# Patient Record
Sex: Female | Born: 1985 | Race: Black or African American | Hispanic: No | Marital: Single | Smoking: Never smoker
Health system: Southern US, Community
[De-identification: ages and names within clinical notes are randomized; demographics above are authoritative.]

## PROBLEM LIST (undated history)

## (undated) DIAGNOSIS — T7840XA Allergy, unspecified, initial encounter: Secondary | ICD-10-CM

## (undated) DIAGNOSIS — R519 Headache, unspecified: Secondary | ICD-10-CM

## (undated) DIAGNOSIS — I10 Essential (primary) hypertension: Secondary | ICD-10-CM

## (undated) DIAGNOSIS — Z973 Presence of spectacles and contact lenses: Secondary | ICD-10-CM

## (undated) DIAGNOSIS — R51 Headache: Secondary | ICD-10-CM

## (undated) DIAGNOSIS — K219 Gastro-esophageal reflux disease without esophagitis: Secondary | ICD-10-CM

## (undated) DIAGNOSIS — E119 Type 2 diabetes mellitus without complications: Secondary | ICD-10-CM

## (undated) HISTORY — DX: Essential (primary) hypertension: I10

## (undated) HISTORY — PX: CHOLECYSTECTOMY: SHX55

## (undated) HISTORY — DX: Allergy, unspecified, initial encounter: T78.40XA

## (undated) HISTORY — DX: Type 2 diabetes mellitus without complications: E11.9

---
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
## 2007-09-06 ENCOUNTER — Emergency Department: Payer: Self-pay | Admitting: Emergency Medicine

## 2007-12-18 ENCOUNTER — Emergency Department: Payer: Self-pay | Admitting: Emergency Medicine

## 2008-03-17 ENCOUNTER — Emergency Department: Payer: Self-pay | Admitting: Emergency Medicine

## 2008-05-10 ENCOUNTER — Emergency Department: Payer: Self-pay | Admitting: Emergency Medicine

## 2008-09-04 ENCOUNTER — Emergency Department: Payer: Self-pay | Admitting: Emergency Medicine

## 2008-11-04 ENCOUNTER — Emergency Department: Payer: Self-pay | Admitting: Emergency Medicine

## 2009-02-12 ENCOUNTER — Observation Stay: Payer: Self-pay | Admitting: Unknown Physician Specialty

## 2009-02-14 ENCOUNTER — Encounter: Payer: Self-pay | Admitting: Obstetrics & Gynecology

## 2009-03-08 ENCOUNTER — Emergency Department: Payer: Self-pay | Admitting: Emergency Medicine

## 2009-04-28 ENCOUNTER — Inpatient Hospital Stay: Payer: Self-pay | Admitting: Obstetrics & Gynecology

## 2009-05-06 ENCOUNTER — Emergency Department: Payer: Self-pay | Admitting: Emergency Medicine

## 2009-05-30 ENCOUNTER — Emergency Department: Payer: Self-pay | Admitting: Emergency Medicine

## 2010-03-17 ENCOUNTER — Emergency Department: Payer: Self-pay | Admitting: Emergency Medicine

## 2010-05-02 ENCOUNTER — Emergency Department: Payer: Self-pay | Admitting: Emergency Medicine

## 2010-06-22 ENCOUNTER — Emergency Department: Payer: Self-pay | Admitting: Internal Medicine

## 2010-12-18 ENCOUNTER — Emergency Department: Payer: Self-pay | Admitting: Emergency Medicine

## 2011-01-02 ENCOUNTER — Inpatient Hospital Stay: Payer: Self-pay | Admitting: Internal Medicine

## 2011-01-19 ENCOUNTER — Ambulatory Visit: Payer: Self-pay | Admitting: "Endocrinology

## 2011-02-14 ENCOUNTER — Ambulatory Visit: Payer: Self-pay | Admitting: "Endocrinology

## 2011-04-15 ENCOUNTER — Ambulatory Visit: Payer: Self-pay

## 2011-10-28 ENCOUNTER — Emergency Department: Payer: Self-pay | Admitting: Emergency Medicine

## 2011-10-28 LAB — URINALYSIS, COMPLETE
Bacteria: NONE SEEN
Glucose,UR: NEGATIVE mg/dL (ref 0–75)
Ph: 5 (ref 4.5–8.0)
Protein: NEGATIVE
RBC,UR: 8 /HPF (ref 0–5)
Squamous Epithelial: 6

## 2011-10-28 LAB — COMPREHENSIVE METABOLIC PANEL
Albumin: 3.6 g/dL (ref 3.4–5.0)
Alkaline Phosphatase: 63 U/L (ref 50–136)
Anion Gap: 7 (ref 7–16)
Calcium, Total: 8.8 mg/dL (ref 8.5–10.1)
Chloride: 105 mmol/L (ref 98–107)
Co2: 27 mmol/L (ref 21–32)
Creatinine: 0.64 mg/dL (ref 0.60–1.30)
EGFR (Non-African Amer.): >60
Glucose: 122 mg/dL — ABNORMAL HIGH (ref 65–99)
Osmolality: 278 (ref 275–301)
Potassium: 3.6 mmol/L (ref 3.5–5.1)
SGOT(AST): 13 U/L — ABNORMAL LOW (ref 15–37)
SGPT (ALT): 23 U/L
Sodium: 139 mmol/L (ref 136–145)

## 2011-10-28 LAB — CBC
HCT: 36.9 % (ref 35.0–47.0)
HGB: 12 g/dL (ref 12.0–16.0)
MCHC: 32.5 g/dL (ref 32.0–36.0)
MCV: 77 fL — ABNORMAL LOW (ref 80–100)
Platelet: 301 10*3/uL (ref 150–440)
RBC: 4.81 10*6/uL (ref 3.80–5.20)
WBC: 8.9 10*3/uL (ref 3.6–11.0)

## 2011-11-22 ENCOUNTER — Emergency Department: Payer: Self-pay | Admitting: *Deleted

## 2011-12-26 ENCOUNTER — Emergency Department: Payer: Self-pay | Admitting: Emergency Medicine

## 2011-12-26 LAB — COMPREHENSIVE METABOLIC PANEL
Albumin: 3.7 g/dL (ref 3.4–5.0)
Alkaline Phosphatase: 98 U/L (ref 50–136)
Anion Gap: 7 (ref 7–16)
BUN: 9 mg/dL (ref 7–18)
Chloride: 99 mmol/L (ref 98–107)
Creatinine: 0.89 mg/dL (ref 0.60–1.30)
EGFR (Non-African Amer.): >60
Glucose: 482 mg/dL — ABNORMAL HIGH (ref 65–99)
Potassium: 4 mmol/L (ref 3.5–5.1)
SGOT(AST): 13 U/L — ABNORMAL LOW (ref 15–37)
SGPT (ALT): 23 U/L
Total Protein: 8.5 g/dL — ABNORMAL HIGH (ref 6.4–8.2)

## 2011-12-26 LAB — URINALYSIS, COMPLETE
Bacteria: NONE SEEN
Bilirubin,UR: NEGATIVE
Blood: NEGATIVE
Glucose,UR: >=500 mg/dL (ref 0–75)
Ketone: NEGATIVE
Leukocyte Esterase: NEGATIVE
Specific Gravity: 1.016 (ref 1.003–1.030)
Squamous Epithelial: <1
WBC UR: 1 /HPF (ref 0–5)

## 2011-12-26 LAB — CBC
MCHC: 31.9 g/dL — ABNORMAL LOW (ref 32.0–36.0)
Platelet: 290 10*3/uL (ref 150–440)
RBC: 5.13 10*6/uL (ref 3.80–5.20)
RDW: 15.4 % — ABNORMAL HIGH (ref 11.5–14.5)

## 2011-12-26 LAB — PREGNANCY, URINE: Pregnancy Test, Urine: NEGATIVE m[IU]/mL

## 2011-12-31 ENCOUNTER — Emergency Department: Payer: Self-pay | Admitting: Emergency Medicine

## 2011-12-31 LAB — URINALYSIS, COMPLETE
Glucose,UR: NEGATIVE mg/dL (ref 0–75)
Ketone: NEGATIVE
Ph: 5 (ref 4.5–8.0)
Specific Gravity: 1.02 (ref 1.003–1.030)
Squamous Epithelial: 5
WBC UR: 4 /HPF (ref 0–5)

## 2011-12-31 LAB — COMPREHENSIVE METABOLIC PANEL
Albumin: 4 g/dL (ref 3.4–5.0)
Alkaline Phosphatase: 81 U/L (ref 50–136)
BUN: 11 mg/dL (ref 7–18)
Bilirubin,Total: 0.5 mg/dL (ref 0.2–1.0)
Chloride: 97 mmol/L — ABNORMAL LOW (ref 98–107)
Creatinine: 1.14 mg/dL (ref 0.60–1.30)
EGFR (Non-African Amer.): >60
SGPT (ALT): 21 U/L
Sodium: 135 mmol/L — ABNORMAL LOW (ref 136–145)
Total Protein: 8.7 g/dL — ABNORMAL HIGH (ref 6.4–8.2)

## 2011-12-31 LAB — PREGNANCY, URINE: Pregnancy Test, Urine: NEGATIVE m[IU]/mL

## 2011-12-31 LAB — CBC
HGB: 13.3 g/dL (ref 12.0–16.0)
MCH: 24.3 pg — ABNORMAL LOW (ref 26.0–34.0)
MCV: 78 fL — ABNORMAL LOW (ref 80–100)
Platelet: 366 10*3/uL (ref 150–440)
RBC: 5.47 10*6/uL — ABNORMAL HIGH (ref 3.80–5.20)
RDW: 14.7 % — ABNORMAL HIGH (ref 11.5–14.5)
WBC: 13.8 10*3/uL — ABNORMAL HIGH (ref 3.6–11.0)

## 2012-02-15 ENCOUNTER — Emergency Department: Payer: Self-pay | Admitting: Internal Medicine

## 2012-02-15 LAB — COMPREHENSIVE METABOLIC PANEL
Alkaline Phosphatase: 75 U/L (ref 50–136)
BUN: 10 mg/dL (ref 7–18)
Bilirubin,Total: 0.2 mg/dL (ref 0.2–1.0)
Chloride: 105 mmol/L (ref 98–107)
Creatinine: 0.73 mg/dL (ref 0.60–1.30)
EGFR (African American): >60
EGFR (Non-African Amer.): >60
Glucose: 159 mg/dL — ABNORMAL HIGH (ref 65–99)
Osmolality: 280 (ref 275–301)
Potassium: 3.8 mmol/L (ref 3.5–5.1)
SGOT(AST): 11 U/L — ABNORMAL LOW (ref 15–37)
SGPT (ALT): 23 U/L (ref 12–78)

## 2012-02-15 LAB — URINALYSIS, COMPLETE
Bacteria: NONE SEEN
Glucose,UR: NEGATIVE mg/dL (ref 0–75)
Ketone: NEGATIVE
Nitrite: NEGATIVE
Protein: NEGATIVE
RBC,UR: 1 /HPF (ref 0–5)
Specific Gravity: 1.02 (ref 1.003–1.030)
Squamous Epithelial: 4
WBC UR: 2 /HPF (ref 0–5)

## 2012-02-15 LAB — CBC
HGB: 13.2 g/dL (ref 12.0–16.0)
MCHC: 33.4 g/dL (ref 32.0–36.0)
Platelet: 329 10*3/uL (ref 150–440)
RDW: 15.1 % — ABNORMAL HIGH (ref 11.5–14.5)
WBC: 5.2 10*3/uL (ref 3.6–11.0)

## 2012-02-15 LAB — LIPASE, BLOOD: Lipase: 60 U/L — ABNORMAL LOW (ref 73–393)

## 2012-04-14 ENCOUNTER — Emergency Department: Payer: Self-pay | Admitting: Emergency Medicine

## 2012-04-14 LAB — COMPREHENSIVE METABOLIC PANEL
Calcium, Total: 9.3 mg/dL (ref 8.5–10.1)
Chloride: 92 mmol/L — ABNORMAL LOW (ref 98–107)
Creatinine: 0.91 mg/dL (ref 0.60–1.30)
EGFR (African American): >60
EGFR (Non-African Amer.): >60
Glucose: 455 mg/dL — ABNORMAL HIGH (ref 65–99)
SGOT(AST): 14 U/L — ABNORMAL LOW (ref 15–37)
SGPT (ALT): 21 U/L (ref 12–78)
Total Protein: 8.3 g/dL — ABNORMAL HIGH (ref 6.4–8.2)

## 2012-04-14 LAB — CBC WITH DIFFERENTIAL/PLATELET
Eosinophil #: 0.1 10*3/uL (ref 0.0–0.7)
HCT: 41.2 % (ref 35.0–47.0)
Lymphocyte %: 19.5 %
MCHC: 33.4 g/dL (ref 32.0–36.0)
Monocyte %: 7.9 %
Neutrophil %: 71.4 %
Platelet: 312 10*3/uL (ref 150–440)
RBC: 5.26 10*6/uL — ABNORMAL HIGH (ref 3.80–5.20)
RDW: 13.8 % (ref 11.5–14.5)
WBC: 12.4 10*3/uL — ABNORMAL HIGH (ref 3.6–11.0)

## 2012-04-14 LAB — URINALYSIS, COMPLETE
Bilirubin,UR: NEGATIVE
Glucose,UR: >=500 mg/dL (ref 0–75)
RBC,UR: 16 /HPF (ref 0–5)
Squamous Epithelial: 1
WBC UR: 46 /HPF (ref 0–5)

## 2012-04-14 LAB — DRUG SCREEN, URINE
Amphetamines, Ur Screen: NEGATIVE (ref ?–1000)
Benzodiazepine, Ur Scrn: NEGATIVE (ref ?–200)
Cannabinoid 50 Ng, Ur ~~LOC~~: NEGATIVE (ref ?–50)
MDMA (Ecstasy)Ur Screen: NEGATIVE (ref ?–500)
Phencyclidine (PCP) Ur S: NEGATIVE (ref ?–25)

## 2012-04-14 LAB — PREGNANCY, URINE: Pregnancy Test, Urine: NEGATIVE m[IU]/mL

## 2012-04-17 ENCOUNTER — Emergency Department: Payer: Self-pay | Admitting: Emergency Medicine

## 2012-04-17 LAB — COMPREHENSIVE METABOLIC PANEL
Albumin: 3.9 g/dL (ref 3.4–5.0)
Alkaline Phosphatase: 109 U/L (ref 50–136)
BUN: 12 mg/dL (ref 7–18)
Calcium, Total: 9.7 mg/dL (ref 8.5–10.1)
Chloride: 87 mmol/L — ABNORMAL LOW (ref 98–107)
Co2: 25 mmol/L (ref 21–32)
Creatinine: 0.86 mg/dL (ref 0.60–1.30)
Potassium: 4.9 mmol/L (ref 3.5–5.1)
SGOT(AST): 20 U/L (ref 15–37)
SGPT (ALT): 23 U/L (ref 12–78)

## 2012-04-17 LAB — URINALYSIS, COMPLETE
Bacteria: NONE SEEN
Glucose,UR: >=500 mg/dL (ref 0–75)
Nitrite: NEGATIVE
Ph: 6 (ref 4.5–8.0)
Protein: NEGATIVE
RBC,UR: 994 /HPF (ref 0–5)
Specific Gravity: 1.024 (ref 1.003–1.030)
WBC UR: 25 /HPF (ref 0–5)

## 2012-04-17 LAB — CBC
HCT: 42.2 % (ref 35.0–47.0)
MCHC: 33.8 g/dL (ref 32.0–36.0)
MCV: 79 fL — ABNORMAL LOW (ref 80–100)
Platelet: 336 10*3/uL (ref 150–440)
RDW: 14 % (ref 11.5–14.5)
WBC: 14.2 10*3/uL — ABNORMAL HIGH (ref 3.6–11.0)

## 2012-04-20 LAB — URINE CULTURE

## 2012-04-26 ENCOUNTER — Observation Stay: Payer: Self-pay | Admitting: Specialist

## 2012-04-26 LAB — URINALYSIS, COMPLETE
Blood: NEGATIVE
Leukocyte Esterase: NEGATIVE
Nitrite: NEGATIVE
Ph: 6 (ref 4.5–8.0)
Protein: NEGATIVE
RBC,UR: <1 /HPF (ref 0–5)
Squamous Epithelial: 1

## 2012-04-26 LAB — CBC WITH DIFFERENTIAL/PLATELET
Basophil #: 0.1 10*3/uL (ref 0.0–0.1)
Basophil %: 0.6 %
Eosinophil #: 0.1 10*3/uL (ref 0.0–0.7)
HCT: 44.1 % (ref 35.0–47.0)
HGB: 14 g/dL (ref 12.0–16.0)
Lymphocyte %: 25.2 %
MCHC: 31.7 g/dL — ABNORMAL LOW (ref 32.0–36.0)
Monocyte #: 1 x10 3/mm — ABNORMAL HIGH (ref 0.2–0.9)
Monocyte %: 7.6 %
Neutrophil #: 8.3 10*3/uL — ABNORMAL HIGH (ref 1.4–6.5)
Neutrophil %: 66 %
Platelet: 374 10*3/uL (ref 150–440)
RDW: 13.7 % (ref 11.5–14.5)
WBC: 12.6 10*3/uL — ABNORMAL HIGH (ref 3.6–11.0)

## 2012-04-26 LAB — COMPREHENSIVE METABOLIC PANEL
Albumin: 4 g/dL (ref 3.4–5.0)
BUN: 14 mg/dL (ref 7–18)
Bilirubin,Total: 0.4 mg/dL (ref 0.2–1.0)
Chloride: 87 mmol/L — ABNORMAL LOW (ref 98–107)
Co2: 24 mmol/L (ref 21–32)
Creatinine: 1.13 mg/dL (ref 0.60–1.30)
EGFR (African American): >60
EGFR (Non-African Amer.): >60
Osmolality: 288 (ref 275–301)
Potassium: 4.5 mmol/L (ref 3.5–5.1)
SGPT (ALT): 28 U/L (ref 12–78)
Sodium: 125 mmol/L — ABNORMAL LOW (ref 136–145)
Total Protein: 9 g/dL — ABNORMAL HIGH (ref 6.4–8.2)

## 2012-04-26 LAB — PREGNANCY, URINE: Pregnancy Test, Urine: NEGATIVE m[IU]/mL

## 2012-04-26 LAB — MAGNESIUM: Magnesium: 2.4 mg/dL

## 2012-04-26 LAB — PHOSPHORUS: Phosphorus: 4.1 mg/dL (ref 2.5–4.9)

## 2012-04-27 LAB — CBC WITH DIFFERENTIAL/PLATELET
Basophil #: 0.1 10*3/uL (ref 0.0–0.1)
Basophil %: 0.6 %
Eosinophil #: 0.2 10*3/uL (ref 0.0–0.7)
Eosinophil %: 1.9 %
HCT: 36.6 % (ref 35.0–47.0)
HGB: 12.2 g/dL (ref 12.0–16.0)
Lymphocyte %: 34.7 %
Monocyte #: 0.8 x10 3/mm (ref 0.2–0.9)
Monocyte %: 8.2 %
Neutrophil #: 5.6 10*3/uL (ref 1.4–6.5)
Neutrophil %: 54.6 %
RBC: 4.66 10*6/uL (ref 3.80–5.20)
WBC: 10.2 10*3/uL (ref 3.6–11.0)

## 2012-04-27 LAB — BASIC METABOLIC PANEL
Anion Gap: 11 (ref 7–16)
BUN: 7 mg/dL (ref 7–18)
Calcium, Total: 8.2 mg/dL — ABNORMAL LOW (ref 8.5–10.1)
Chloride: 94 mmol/L — ABNORMAL LOW (ref 98–107)
Glucose: 230 mg/dL — ABNORMAL HIGH (ref 65–99)
Osmolality: 264 (ref 275–301)

## 2012-06-23 ENCOUNTER — Emergency Department: Payer: Self-pay | Admitting: Emergency Medicine

## 2012-06-23 LAB — COMPREHENSIVE METABOLIC PANEL
Albumin: 3.8 g/dL (ref 3.4–5.0)
Alkaline Phosphatase: 73 U/L (ref 50–136)
Bilirubin,Total: 0.3 mg/dL (ref 0.2–1.0)
Calcium, Total: 9.5 mg/dL (ref 8.5–10.1)
Creatinine: 0.73 mg/dL (ref 0.60–1.30)
EGFR (Non-African Amer.): >60
Glucose: 143 mg/dL — ABNORMAL HIGH (ref 65–99)
SGPT (ALT): 23 U/L (ref 12–78)
Sodium: 134 mmol/L — ABNORMAL LOW (ref 136–145)

## 2012-06-23 LAB — URINALYSIS, COMPLETE
Bilirubin,UR: NEGATIVE
Blood: NEGATIVE
Glucose,UR: NEGATIVE mg/dL (ref 0–75)
Ketone: NEGATIVE
Leukocyte Esterase: NEGATIVE
Nitrite: NEGATIVE
Ph: 5 (ref 4.5–8.0)
Protein: NEGATIVE
RBC,UR: <1 /HPF (ref 0–5)
Squamous Epithelial: 1
WBC UR: 1 /HPF (ref 0–5)

## 2012-06-23 LAB — CBC
HCT: 40.1 % (ref 35.0–47.0)
HGB: 13.2 g/dL (ref 12.0–16.0)
MCH: 25.5 pg — ABNORMAL LOW (ref 26.0–34.0)
MCV: 77 fL — ABNORMAL LOW (ref 80–100)
Platelet: 354 10*3/uL (ref 150–440)
RBC: 5.19 10*6/uL (ref 3.80–5.20)
WBC: 11.9 10*3/uL — ABNORMAL HIGH (ref 3.6–11.0)

## 2012-06-23 LAB — LIPASE, BLOOD: Lipase: 82 U/L (ref 73–393)

## 2012-06-23 LAB — PREGNANCY, URINE: Pregnancy Test, Urine: NEGATIVE m[IU]/mL

## 2012-09-22 ENCOUNTER — Encounter: Payer: Self-pay | Admitting: Family Medicine

## 2012-09-22 ENCOUNTER — Ambulatory Visit (INDEPENDENT_AMBULATORY_CARE_PROVIDER_SITE_OTHER): Payer: 59 | Admitting: Family Medicine

## 2012-09-22 VITALS — BP 130/80 | HR 71 | Temp 98.1°F | Ht 66.0 in | Wt 276.8 lb

## 2012-09-22 DIAGNOSIS — E119 Type 2 diabetes mellitus without complications: Secondary | ICD-10-CM | POA: Insufficient documentation

## 2012-09-22 DIAGNOSIS — Z23 Encounter for immunization: Secondary | ICD-10-CM

## 2012-09-22 DIAGNOSIS — I10 Essential (primary) hypertension: Secondary | ICD-10-CM

## 2012-09-22 DIAGNOSIS — Z8679 Personal history of other diseases of the circulatory system: Secondary | ICD-10-CM | POA: Insufficient documentation

## 2012-09-22 DIAGNOSIS — IMO0001 Reserved for inherently not codable concepts without codable children: Secondary | ICD-10-CM

## 2012-09-22 LAB — COMPREHENSIVE METABOLIC PANEL
ALT: 18 U/L (ref 0–35)
CO2: 26 mEq/L (ref 19–32)
GFR: 131.72 mL/min (ref >60.00)
Sodium: 138 mEq/L (ref 135–145)
Total Bilirubin: 0.5 mg/dL (ref 0.3–1.2)
Total Protein: 7.3 g/dL (ref 6.0–8.3)

## 2012-09-22 LAB — HM DIABETES FOOT EXAM

## 2012-09-22 LAB — LIPID PANEL
HDL: 29.9 mg/dL — ABNORMAL LOW (ref >39.00)
Total CHOL/HDL Ratio: 5

## 2012-09-22 LAB — HEMOGLOBIN A1C: Hgb A1c MFr Bld: 8.2 % — ABNORMAL HIGH (ref 4.6–6.5)

## 2012-09-22 MED ORDER — METFORMIN HCL 500 MG PO TABS: 500.0000 mg | ORAL_TABLET | Freq: Two times a day (BID) | ORAL | Status: DC

## 2012-09-22 MED ORDER — HYDROCHLOROTHIAZIDE 25 MG PO TABS: 25.0000 mg | ORAL_TABLET | Freq: Every day | ORAL | Status: DC

## 2012-09-22 MED ORDER — LISINOPRIL 10 MG PO TABS: 10.0000 mg | ORAL_TABLET | Freq: Every day | ORAL | Status: DC

## 2012-09-22 MED ORDER — GLIPIZIDE 10 MG PO TABS: 10.0000 mg | ORAL_TABLET | Freq: Two times a day (BID) | ORAL | Status: DC

## 2012-09-22 NOTE — Assessment & Plan Note (Signed)
At goal < 130/80 on lisinopril, HCTZ.

## 2012-09-22 NOTE — Assessment & Plan Note (Signed)
Likely poor control. Refer to nutritionist. Discussed stopping soda and juice to start.  check A1C.. Likely need to adjust insulin and met, gliburide. Higher dose metformin in past has cause CBGs to drop too low. Instructed her to check FBS daily and bring in measurements.

## 2012-09-22 NOTE — Addendum Note (Signed)
Addended by: Consuello Masse on: 09/22/2012 09:47 AM   Modules accepted: Orders

## 2012-09-22 NOTE — Progress Notes (Signed)
  Subjective:    Patient ID: [name], female    DOB: [birth year], 27 y.o.   MRN: [id number]  HPI 27 year old female presents to establish.  In past she had been seeing Dr. Tedd Sias for ENDO. Last saw her in 06/2012 for diabetes.  Diabetes:  Inadequately control. Not sure what last A1C was. She was diagnosed with DM in 2012. Taking metformin glipizide and a long acting insulin at bedtime 30 Units Using medications without difficulties: None Hypoglycemic episodes:None in last 2 weeks Hyperglycemic episodes: 338 Feet problems: None Blood Sugars averaging: not checking fasting.. did take a postprandial 93-180 last week eye exam within last year: yes   Hypertension:    Using medication without problems or lightheadedness: None Chest pain with exertion:None Edema:None Short of breath:None Average home BPs: not checking Other issues:  Diet: moderate... Eats a lot of fried foods, drinks juice/soda,  Bread, pasta, potatos.   Last pap 06/2012 at [facility] side OB.  Has nexplanan... Not going there for CPX regularly.. Will come her for CPX next year.  Review of Systems  Constitutional: Negative for fever, fatigue and unexpected weight change.  HENT: Negative for ear pain, congestion, sore throat, sneezing, trouble swallowing and sinus pressure.   Eyes: Negative for pain and itching.  Respiratory: Negative for cough, shortness of breath and wheezing.   Cardiovascular: Negative for chest pain, palpitations and leg swelling.  Gastrointestinal: Negative for nausea, abdominal pain, diarrhea, constipation and blood in stool.  Genitourinary: Negative for dysuria, hematuria, vaginal discharge, difficulty urinating and menstrual problem.  Skin: Negative for rash.  Neurological: Negative for syncope, weakness, light-headedness, numbness and headaches.  Psychiatric/Behavioral: Negative for confusion and dysphoric mood. The patient is not nervous/anxious.        Objective:   Physical Exam   Constitutional: Vital signs are normal. She appears well-developed and well-nourished. She is cooperative.  Non-toxic appearance. She does not appear ill. No distress.  obese  HENT:  Head: Normocephalic.  Right Ear: Hearing, tympanic membrane, external ear and ear canal normal.  Left Ear: Hearing, tympanic membrane, external ear and ear canal normal.  Nose: Nose normal.  Eyes: Conjunctivae, EOM and lids are normal. Pupils are equal, round, and reactive to light. No foreign bodies found.  Neck: Trachea normal and normal range of motion. Neck supple. Carotid bruit is not present. No mass and no thyromegaly present.  Cardiovascular: Normal rate, regular rhythm, S1 normal, S2 normal, normal heart sounds and intact distal pulses.  Exam reveals no gallop.   No murmur heard. Pulmonary/Chest: Effort normal and breath sounds normal. No respiratory distress. She has no wheezes. She has no rhonchi. She has no rales.  Abdominal: Soft. Normal appearance and bowel sounds are normal. She exhibits no distension, no fluid wave, no abdominal bruit and no mass. There is no hepatosplenomegaly. There is no tenderness. There is no rebound, no guarding and no CVA tenderness. No hernia.  Lymphadenopathy:    She has no cervical adenopathy.    She has no axillary adenopathy.  Neurological: She is alert. She has normal strength. No cranial nerve deficit or sensory deficit.  Skin: Skin is warm, dry and intact. No rash noted.  Psychiatric: Her speech is normal and behavior is normal. Judgment normal. Her mood appears not anxious. Cognition and memory are normal. She does not exhibit a depressed mood.          Assessment & Plan:

## 2012-09-22 NOTE — Patient Instructions (Addendum)
Call with long acting insulin name for refills.  Stop at lab on way out. At least every 2 days checking fasting blood sugar.. Bring the measurements to the next OV. Continue current meds for now. Follow up in 3 months with labs prior.

## 2012-09-29 ENCOUNTER — Telehealth: Payer: Self-pay | Admitting: *Deleted

## 2012-09-29 MED ORDER — INSULIN GLARGINE 100 UNIT/ML ~~LOC~~ SOLN: 30.0000 [IU] | Freq: Every day | SUBCUTANEOUS | Status: DC

## 2012-09-29 NOTE — Telephone Encounter (Signed)
Medication refill

## 2012-10-04 ENCOUNTER — Telehealth: Payer: Self-pay

## 2012-10-04 NOTE — Telephone Encounter (Signed)
Pt said on 10/03/12 at 9:15 pm BS was 510; pt ate [place] sandwich for supper; took Lantus 30 units at hs, glipizide and metformin and then next BS was 512 at 9:58 pm; then next ck BS was 480 at 10:42 PM and then at 11:30 PM BS 402. Toes were numb and pt was very thirsty.10/04/12 FBS was 248; ate breakfast (blueberry waffle and water); at 9:15 am BS 302 and then recked BS at 12 noon BS 270. Pt has not eaten lunch; pt said today toes are still numb and pt has h/a. [facility] emp pharmacy.Please advise.

## 2012-10-04 NOTE — Telephone Encounter (Signed)
Fasting blood sugars very high as well as post prandials. i know she has dropped to low in past but her numbers are way to high... We need to get them down and she should tolerate medicaiton adjustment.  I would have her start by increasing Lantus at bedtime to 40 Units.. Continue other medications the same.  Have her call in 1 week with fasting blood sugars and 2 hour post prandials. We will adjust lantus until FBS are < 120.

## 2012-10-04 NOTE — Telephone Encounter (Signed)
Pt left v/m that her BS running in 500's and request call back to adjust Lantus. Left v/m for pt to call back.

## 2012-10-04 NOTE — Telephone Encounter (Signed)
Patient advised.

## 2012-10-04 NOTE — Telephone Encounter (Signed)
Pt left v/m at work and to call back after 1 pm if no answer when I return call;left v/m for pt to call back.

## 2012-10-09 ENCOUNTER — Emergency Department: Payer: Self-pay | Admitting: Emergency Medicine

## 2012-10-10 ENCOUNTER — Telehealth: Payer: Self-pay | Admitting: Family Medicine

## 2012-10-10 DIAGNOSIS — R200 Anesthesia of skin: Secondary | ICD-10-CM

## 2012-10-10 NOTE — Telephone Encounter (Signed)
The patient called and is hoping to get a referral to a [place] neurologist.  She states she was seen in the emergency room for leg numbness and they made the recommendation to go through her primary care office to get this referral.  Her callback number is [phone]

## 2012-10-11 DIAGNOSIS — R200 Anesthesia of skin: Secondary | ICD-10-CM | POA: Insufficient documentation

## 2012-10-18 ENCOUNTER — Ambulatory Visit: Payer: 59 | Admitting: Family Medicine

## 2012-11-03 ENCOUNTER — Encounter: Payer: 59 | Attending: Family Medicine | Admitting: Dietician

## 2012-12-15 ENCOUNTER — Other Ambulatory Visit: Payer: 59

## 2012-12-22 ENCOUNTER — Ambulatory Visit: Payer: 59 | Admitting: Family Medicine

## 2012-12-23 ENCOUNTER — Ambulatory Visit: Payer: 59 | Admitting: Family Medicine

## 2012-12-25 ENCOUNTER — Emergency Department: Payer: Self-pay | Admitting: Emergency Medicine

## 2012-12-25 LAB — COMPREHENSIVE METABOLIC PANEL
Albumin: 4 g/dL (ref 3.4–5.0)
Alkaline Phosphatase: 70 U/L (ref 50–136)
BUN: 10 mg/dL (ref 7–18)
Bilirubin,Total: 0.5 mg/dL (ref 0.2–1.0)
Calcium, Total: 9.6 mg/dL (ref 8.5–10.1)
Co2: 26 mmol/L (ref 21–32)
Potassium: 3.7 mmol/L (ref 3.5–5.1)
SGOT(AST): 13 U/L — ABNORMAL LOW (ref 15–37)
SGPT (ALT): 19 U/L (ref 12–78)
Sodium: 135 mmol/L — ABNORMAL LOW (ref 136–145)
Total Protein: 8.4 g/dL — ABNORMAL HIGH (ref 6.4–8.2)

## 2012-12-25 LAB — URINALYSIS, COMPLETE
Bilirubin,UR: NEGATIVE
Blood: NEGATIVE
Glucose,UR: NEGATIVE mg/dL (ref 0–75)
Ketone: NEGATIVE
Ph: 5 (ref 4.5–8.0)
Protein: NEGATIVE

## 2012-12-25 LAB — CBC
HCT: 41.5 % (ref 35.0–47.0)
MCH: 25.6 pg — ABNORMAL LOW (ref 26.0–34.0)
MCV: 77 fL — ABNORMAL LOW (ref 80–100)
Platelet: 378 10*3/uL (ref 150–440)
RBC: 5.41 10*6/uL — ABNORMAL HIGH (ref 3.80–5.20)
RDW: 15.1 % — ABNORMAL HIGH (ref 11.5–14.5)
WBC: 14.8 10*3/uL — ABNORMAL HIGH (ref 3.6–11.0)

## 2012-12-29 ENCOUNTER — Ambulatory Visit: Payer: 59 | Admitting: Family Medicine

## 2012-12-29 DIAGNOSIS — Z0289 Encounter for other administrative examinations: Secondary | ICD-10-CM

## 2013-01-03 ENCOUNTER — Ambulatory Visit: Payer: 59 | Admitting: Family Medicine

## 2013-01-04 ENCOUNTER — Other Ambulatory Visit: Payer: Self-pay

## 2013-01-04 MED ORDER — "PEN NEEDLES 3/16"" 31G X 5 MM MISC": 1.0000 "pen " | Status: DC

## 2013-01-04 NOTE — Telephone Encounter (Signed)
Pt said no refills on glipizide,lantus solostar and metformin and pen needles.spoke with Harrold Donath at [facility] and does have refills except pen needles; pt been using 31 G 3/16" pen needles; called in to use with lantus solostar. Pt notified.

## 2013-01-10 ENCOUNTER — Encounter: Payer: Self-pay | Admitting: Family Medicine

## 2013-01-10 ENCOUNTER — Ambulatory Visit (INDEPENDENT_AMBULATORY_CARE_PROVIDER_SITE_OTHER): Payer: 59 | Admitting: Family Medicine

## 2013-01-10 VITALS — BP 124/72 | HR 76 | Temp 98.0°F | Wt 267.0 lb

## 2013-01-10 DIAGNOSIS — I10 Essential (primary) hypertension: Secondary | ICD-10-CM

## 2013-01-10 DIAGNOSIS — IMO0001 Reserved for inherently not codable concepts without codable children: Secondary | ICD-10-CM

## 2013-01-10 LAB — HEMOGLOBIN A1C: Hgb A1c MFr Bld: 8.3 % — ABNORMAL HIGH (ref 4.6–6.5)

## 2013-01-10 NOTE — Assessment & Plan Note (Signed)
Well controlled on current med.

## 2013-01-10 NOTE — Assessment & Plan Note (Signed)
Improved control. Send for A1C.  She will loo into victoza or byetta... If we move ahead with this we may need to stop one of her other meds like glipizide.

## 2013-01-10 NOTE — Patient Instructions (Addendum)
Look into insurance coverage of Byetta or Victoza. Stop at lab on the way out.. We will call you with measurement. Follow up in 3 months with labs prior.

## 2013-01-10 NOTE — Progress Notes (Signed)
27 year old female presents to establish.   In past she had been seeing Dr. Tedd Sias for ENDO. Last saw her in 06/2012 for diabetes.   Diabetes: Due for re-eval.  Lab Results  Component Value Date   HGBA1C 8.2* 09/22/2012  She was diagnosed with DM in 2012.  Taking metformin glipizide and a long acting insulin at bedtime 40 Units  Using medications without difficulties: None  Hypoglycemic episodes: None  Hyperglycemic episodes:235.Marland Kitchen After she skipped lantus Feet problems: None  Blood Sugars averaging: FBS 109-135 eye exam within last year: yes   Shew is interested in trying a medication for weight loss and DM... Like victoza.  Hypertension:  Using medication without problems or lightheadedness: None  Chest pain with exertion:None  Edema:None  Short of breath:None  Average home BPs: not checking  Other issues:  Diet: moderate... Eats a lot of fried foods, drinks juice/soda, Bread, pasta, potatos.    Review of Systems  Constitutional: Negative for fever, fatigue and unexpected weight change.  HENT: Negative for ear pain, congestion, sore throat, sneezing, trouble swallowing and sinus pressure.  Eyes: Negative for pain and itching.  Respiratory: Negative for cough, shortness of breath and wheezing.  Cardiovascular: Negative for chest pain, palpitations and leg swelling.  Gastrointestinal: Negative for nausea, abdominal pain, diarrhea, constipation and blood in stool.  Genitourinary: Negative for dysuria, hematuria, vaginal discharge, difficulty urinating and menstrual problem.  Skin: Negative for rash.  Neurological: Negative for syncope, weakness, light-headedness, numbness and headaches.  Psychiatric/Behavioral: Negative for confusion and dysphoric mood. The patient is not nervous/anxious.  Objective:   Physical Exam  Constitutional: Vital signs are normal. She appears well-developed and well-nourished. She is cooperative. Non-toxic appearance. She does not appear ill. No distress.   obese  HENT:  Head: Normocephalic.  Right Ear: Hearing, tympanic membrane, external ear and ear canal normal.  Left Ear: Hearing, tympanic membrane, external ear and ear canal normal.  Nose: Nose normal.  Eyes: Conjunctivae, EOM and lids are normal. Pupils are equal, round, and reactive to light. No foreign bodies found.  Neck: Trachea normal and normal range of motion. Neck supple. Carotid bruit is not present. No mass and no thyromegaly present.  Cardiovascular: Normal rate, regular rhythm, S1 normal, S2 normal, normal heart sounds and intact distal pulses. Exam reveals no gallop.  No murmur heard.  Pulmonary/Chest: Effort normal and breath sounds normal. No respiratory distress. She has no wheezes. She has no rhonchi. She has no rales.  Abdominal: Soft. Normal appearance and bowel sounds are normal. She exhibits no distension, no fluid wave, no abdominal bruit and no mass. There is no hepatosplenomegaly. There is no tenderness. There is no rebound, no guarding and no CVA tenderness. No hernia.  Lymphadenopathy:  She has no cervical adenopathy.  She has no axillary adenopathy.  Neurological: She is alert. She has normal strength. No cranial nerve deficit or sensory deficit.  Skin: Skin is warm, dry and intact. No rash noted.  Psychiatric: Her speech is normal and behavior is normal. Judgment normal. Her mood appears not anxious. Cognition and memory are normal. She does not exhibit a depressed mood.    Diabetic foot exam: Normal inspection No skin breakdown No calluses  Normal DP pulses Normal sensation to light touch and monofilament Nails normal  Wt Readings from Last 3 Encounters:  01/10/13 267 lb (121.11 kg)  09/22/12 276 lb 12 oz (125.533 kg)

## 2013-01-17 ENCOUNTER — Telehealth: Payer: Self-pay

## 2013-01-17 NOTE — Telephone Encounter (Signed)
Pt said when she takes Lantus 40 units at bedtime; pt has weakness, nausea and shakiness  The next morning FBS averaging 66-85. Sunday and Monday nights pt did not take Lantus but took all other prescribed meds and did not have shakiness, nausea or weakness the next morning. Pt wants to know if should decrease Lantus. Pt not seeing endo, Dr Tedd Sias any more. [facility] pharmacy.Please advise. Pt request cb. Last dose of Lantus taken was Sat. Night 01/14/13. Today FBS was 108.

## 2013-01-17 NOTE — Telephone Encounter (Signed)
Decrease lantus to 38 units.  (change in med list please)She needs to eat a healthy high fiber, high protein snack at bedtime to help keep sugar steady through the night. Has she decided if she is interested in trying victoza?

## 2013-01-17 NOTE — Telephone Encounter (Signed)
I have tried every number in patient's chart and they are either disconnected, VM not set-up or just not available, will try cell again later.

## 2013-01-18 NOTE — Telephone Encounter (Signed)
Left message at [phone] asking pt to return my call

## 2013-01-18 NOTE — Telephone Encounter (Signed)
No answer, voicemail not set up yet will try again later

## 2013-01-19 NOTE — Telephone Encounter (Signed)
Tried to call patient back, no answer and voice mail has not been set up.  Will try again.

## 2013-01-19 NOTE — Telephone Encounter (Signed)
Spoke with patient, she says insurance will cover victoza but she is asking if you were going to add this to what she's already taking, or were you going to use this in place of one of the meds that's she's already taking.  Please advise.

## 2013-01-19 NOTE — Telephone Encounter (Signed)
Spoke with patient and advised results, med list updated and pt will check with insurance to see if Victoza is covered.

## 2013-01-19 NOTE — Telephone Encounter (Signed)
I would suggest stopping glucotrol, lowering lantus some and adding victoza at low dose with plan to likely increase. Remember victoza helps with weight loss and decreases appetite.

## 2013-01-20 MED ORDER — LIRAGLUTIDE 18 MG/3ML ~~LOC~~ SOPN: 0.6000 mg | PEN_INJECTOR | Freq: Every day | SUBCUTANEOUS | Status: DC

## 2013-01-20 NOTE — Telephone Encounter (Signed)
Spoke with mom and advised results, she will let pt know and to schedule appt

## 2013-01-20 NOTE — Telephone Encounter (Signed)
Advised patient as instructed.  She want directions on how much she needs to adjust her lantus, and if she should stop glucotrol completely.  I told her we would call her back with that information later today.

## 2013-01-20 NOTE — Telephone Encounter (Signed)
Please D/C glucotrol fro med list, pt should stop.  Contine LAntus for now at lowe 38 UNits dose until we are able to increase victoza. Will start at low dose victoza and have pt make 1-2 week follow up app  After starting victoza. She needs to bring in CBG measurments at that time.

## 2013-02-08 ENCOUNTER — Other Ambulatory Visit: Payer: Self-pay | Admitting: *Deleted

## 2013-02-08 MED ORDER — LISINOPRIL 10 MG PO TABS: 10.0000 mg | ORAL_TABLET | Freq: Every day | ORAL | Status: DC

## 2013-03-10 ENCOUNTER — Telehealth: Payer: Self-pay | Admitting: Family Medicine

## 2013-03-10 NOTE — Telephone Encounter (Signed)
Noted  

## 2013-03-10 NOTE — Telephone Encounter (Signed)
Patient Information:  Caller Name: [name]  Phone: [phone]  [name], [name]  Gender: Female  DOB: Sep 24, 1985  Age: 27 Years  PCP: [facility])  Pregnant: No  Office Follow Up:  Does the office need to follow up with this patient?: No  Instructions For The Office: N/A   Symptoms  Reason For Call & Symptoms: For the past year pt has had pain when eating/drinking approximately once a week.  For past two months the pain has came every day at random times, lasting about 3-4 hours.  03/10/13 constant abdominal pain since awakening this morning, at and above belly button, will only stop if she bends over, tender to touch, afebrile.  Rates pain at a 7/10.  Describes as sharp.  Nauseated.  Advised pt of the need to be seen in ER now, pt lives in [place] and states she will have someone drive her to [facility] now.  Reviewed Health History In EMR: Yes  Reviewed Medications In EMR: Yes  Reviewed Allergies In EMR: Yes  Reviewed Surgeries / Procedures: Yes  Date of Onset of Symptoms: 03/10/2013 OB / GYN:  LMP: Unknown  Guideline(s) Used:  Abdominal Pain - Female  Disposition Per Guideline:   Go to ED Now (or to Office with PCP Approval)  Reason For Disposition Reached:   Constant abdominal pain lasting > 2 hours  Advice Given:  N/A  Patient Will Follow Care Advice:  YES

## 2013-03-13 ENCOUNTER — Emergency Department: Payer: Self-pay | Admitting: Emergency Medicine

## 2013-03-13 LAB — LIPASE, BLOOD: Lipase: 75 U/L (ref 73–393)

## 2013-03-13 LAB — URINALYSIS, COMPLETE
Bilirubin,UR: NEGATIVE
Glucose,UR: NEGATIVE mg/dL (ref 0–75)
Ketone: NEGATIVE
Leukocyte Esterase: NEGATIVE
Ph: 5 (ref 4.5–8.0)
Protein: 30
Specific Gravity: 1.027 (ref 1.003–1.030)
WBC UR: NONE SEEN /HPF (ref 0–5)

## 2013-03-13 LAB — TROPONIN I: Troponin-I: <0.02 ng/mL

## 2013-03-13 LAB — CBC
MCHC: 34.1 g/dL (ref 32.0–36.0)
MCV: 77 fL — ABNORMAL LOW (ref 80–100)
Platelet: 346 10*3/uL (ref 150–440)

## 2013-03-13 LAB — COMPREHENSIVE METABOLIC PANEL
Albumin: 3.5 g/dL (ref 3.4–5.0)
Alkaline Phosphatase: 61 U/L (ref 50–136)
Anion Gap: 6 — ABNORMAL LOW (ref 7–16)
Calcium, Total: 8.9 mg/dL (ref 8.5–10.1)
Chloride: 106 mmol/L (ref 98–107)
Co2: 25 mmol/L (ref 21–32)
Glucose: 104 mg/dL — ABNORMAL HIGH (ref 65–99)
Osmolality: 272 (ref 275–301)
SGOT(AST): 14 U/L — ABNORMAL LOW (ref 15–37)
SGPT (ALT): 19 U/L (ref 12–78)

## 2013-04-06 ENCOUNTER — Other Ambulatory Visit: Payer: 59

## 2013-04-07 ENCOUNTER — Other Ambulatory Visit (INDEPENDENT_AMBULATORY_CARE_PROVIDER_SITE_OTHER): Payer: 59

## 2013-04-07 DIAGNOSIS — IMO0001 Reserved for inherently not codable concepts without codable children: Secondary | ICD-10-CM

## 2013-04-07 LAB — HEMOGLOBIN A1C: Hgb A1c MFr Bld: 6.9 % — ABNORMAL HIGH (ref 4.6–6.5)

## 2013-04-07 LAB — COMPREHENSIVE METABOLIC PANEL
AST: 13 U/L (ref 0–37)
Alkaline Phosphatase: 46 U/L (ref 39–117)
BUN: 8 mg/dL (ref 6–23)
Glucose, Bld: 167 mg/dL — ABNORMAL HIGH (ref 70–99)
Total Bilirubin: 0.4 mg/dL (ref 0.3–1.2)

## 2013-04-07 LAB — LIPID PANEL
Cholesterol: 115 mg/dL (ref 0–200)
HDL: 34.3 mg/dL — ABNORMAL LOW (ref >39.00)
Triglycerides: 75 mg/dL (ref 0.0–149.0)
VLDL: 15 mg/dL (ref 0.0–40.0)

## 2013-04-10 ENCOUNTER — Telehealth: Payer: Self-pay

## 2013-04-10 NOTE — Telephone Encounter (Signed)
Pt called back to verify appt discussed was for 04/11/13 at 2:45 pm. Advised pt that was correct and pt voiced understanding. Advised pt if condition changes or worsens prior to appt pt will go to UC if needed.

## 2013-04-10 NOTE — Telephone Encounter (Signed)
Pt said for one month when pt takes meds; metformin, lisinopril,HCTZ,victoza and lantus pt gets h/a in the AM and PM. Pt said if gets severe h/a has vomiting also. Pt has 3 mth f/u scheduled 04/14/13 but pt would like sooner appt if possible; pt has already had labs done. Pt scheduled appt 04/11/13 at 2:45 pm with Dr Ermalene Searing.

## 2013-04-11 ENCOUNTER — Ambulatory Visit (INDEPENDENT_AMBULATORY_CARE_PROVIDER_SITE_OTHER): Payer: 59 | Admitting: Family Medicine

## 2013-04-11 ENCOUNTER — Encounter: Payer: Self-pay | Admitting: Family Medicine

## 2013-04-11 VITALS — BP 130/76 | HR 88 | Temp 98.3°F | Ht 66.0 in | Wt 275.0 lb

## 2013-04-11 DIAGNOSIS — IMO0001 Reserved for inherently not codable concepts without codable children: Secondary | ICD-10-CM

## 2013-04-11 DIAGNOSIS — I10 Essential (primary) hypertension: Secondary | ICD-10-CM

## 2013-04-11 DIAGNOSIS — G4452 New daily persistent headache (NDPH): Secondary | ICD-10-CM | POA: Insufficient documentation

## 2013-04-11 NOTE — Assessment & Plan Note (Signed)
Improved control, now at goal. Will try to decrease lantus and increase victoza to 1.2 mcg... If tolerated can increase at next OV in 2 weeks to max 1.8. Encouraged exercise, weight loss, healthy eating habits. No associated nausea but she does have pending gallbladder surgery.

## 2013-04-11 NOTE — Patient Instructions (Signed)
Increase victoza to 1.2 mcg daily. Decrease lantus 32 units daily. Continue metformin. Follow blood sugar.. Call if > 200 fasting or postprandial.   Follow up in 2 weeks for headache recheck, DM update and CPX with pap, no labs prior.

## 2013-04-11 NOTE — Progress Notes (Signed)
27 year old female presetns for 3 month follow up.  She reports she has been having  almostdaily headache since 01/2013. Lasts all day. Often wakes up with headache.  Frontal HA, throbbing, no vision change, no confusion,  Nausea from gallbladder (scheduled on 11/4 for cholecystectomy) but none associated with HA, photphonophobia.  She has no hx of headache.  She has tried excedrine, aleve, tylenol. She does think the med causes her headache to come on.. Not sure which one.  no change in frequency or extent of headache, not progressive.  In past she had been seeing Dr. Tedd Sias for ENDO. Last saw her in 06/2012 for diabetes.   Diabetes: Significant improvement in blood sugar control on metformin, lantus 38 units as well as on victoza low dose, started this in early 02/2013 Lab Results  Component Value Date   HGBA1C 6.9* 04/07/2013  She was diagnosed with DM in 2012.  Using medications without difficulties: None  Hypoglycemic episodes: None  Hyperglycemic episodes: None since last time she was here Feet problems: None  Blood Sugars averaging: FBS 69-80, never less than 60. eye exam within last year: yes Wt Readings from Last 3 Encounters:  04/11/13 275 lb (124.739 kg)  01/10/13 267 lb (121.11 kg)  09/22/12 276 lb 12 oz (125.533 kg)    Hypertension: At goal < 130/80 on lisinopril and HCTZ. BP Readings from Last 3 Encounters:  04/11/13 130/76  01/10/13 124/72  09/22/12 130/80  Using medication without problems or lightheadedness: None  Chest pain with exertion:None  Edema:None  Short of breath:None  Average home BPs: not checking  Other issues:  Diet: moderate... Has been working on healthy eating.  Review of Systems  Constitutional: Negative for fever, fatigue and unexpected weight change.  HENT: Negative for ear pain, congestion, sore throat, sneezing, trouble swallowing and sinus pressure.  Eyes: Negative for pain and itching.   Respiratory: Negative for cough, shortness of  breath and wheezing.  Cardiovascular: Negative for chest pain, palpitations and leg swelling.  Gastrointestinal: Negative for nausea, abdominal pain, diarrhea, constipation and blood in stool.  Genitourinary: Negative for dysuria, hematuria, vaginal discharge, difficulty urinating and menstrual problem.  Skin: Negative for rash.  Neurological: Negative for syncope, weakness, light-headedness, numbness and headaches.  Psychiatric/Behavioral: Negative for confusion and dysphoric mood. The patient is not nervous/anxious.  Objective:   Physical Exam  Constitutional: Vital signs are normal. She appears well-developed and well-nourished. She is cooperative. Non-toxic appearance. She does not appear ill. No distress.  obese  HENT:  Head: Normocephalic.  Right Ear: Hearing, tympanic membrane, external ear and ear canal normal.  Left Ear: Hearing, tympanic membrane, external ear and ear canal normal.  Nose: Nose normal.  Eyes: Conjunctivae, EOM and lids are normal. Pupils are equal, round, and reactive to light. No foreign bodies found. No papiledema Neck: Trachea normal and normal range of motion. Neck supple. Carotid bruit is not present. No mass and no thyromegaly present.  Cardiovascular: Normal rate, regular rhythm, S1 normal, S2 normal, normal heart sounds and intact distal pulses. Exam reveals no gallop.  No murmur heard.  Pulmonary/Chest: Effort normal and breath sounds normal. No respiratory distress. She has no wheezes. She has no rhonchi. She has no rales.  Abdominal: Soft. Normal appearance and bowel sounds are normal. She exhibits no distension, no fluid wave, no abdominal bruit and no mass. There is no hepatosplenomegaly. There is no tenderness. There is no rebound, no guarding and no CVA tenderness. No hernia.  Lymphadenopathy:  She  has no cervical adenopathy.  She has no axillary adenopathy.  Neurological: She is alert. She has normal strength. No cranial nerve deficit or sensory  deficit. strenght 5/5, no change in sensation  Skin: Skin is warm, dry and intact. No rash noted.  Psychiatric: Her speech is normal and behavior is normal. Judgment normal. Her mood appears not anxious. Cognition and memory are normal. She does not exhibit a depressed mood.    Diabetic foot exam:  Normal inspection  No skin breakdown  No calluses  Normal DP pulses  Normal sensation to light touch and monofilament  Nails normal

## 2013-04-11 NOTE — Assessment & Plan Note (Signed)
Well controlled. Continue current medication. No clear association with headaches

## 2013-04-11 NOTE — Assessment & Plan Note (Signed)
No neuro changes.  ? Tension headache vs migraine vs med SE.  Will try to adjust medications.. Follow up in 2 weeks.

## 2013-04-14 ENCOUNTER — Ambulatory Visit: Payer: 59 | Admitting: Family Medicine

## 2013-04-18 ENCOUNTER — Ambulatory Visit: Payer: Self-pay | Admitting: Surgery

## 2013-04-19 LAB — PATHOLOGY REPORT

## 2013-04-22 ENCOUNTER — Emergency Department: Payer: Self-pay | Admitting: Emergency Medicine

## 2013-04-22 LAB — CBC
MCH: 25.5 pg — ABNORMAL LOW (ref 26.0–34.0)
MCHC: 33.3 g/dL (ref 32.0–36.0)
Platelet: 349 10*3/uL (ref 150–440)
RBC: 5.18 10*6/uL (ref 3.80–5.20)

## 2013-04-22 LAB — URINALYSIS, COMPLETE
Bilirubin,UR: NEGATIVE
Ketone: NEGATIVE
Nitrite: NEGATIVE
RBC,UR: 26 /HPF (ref 0–5)
Specific Gravity: 1.023 (ref 1.003–1.030)
WBC UR: 12 /HPF (ref 0–5)

## 2013-04-22 LAB — COMPREHENSIVE METABOLIC PANEL
Albumin: 3.5 g/dL (ref 3.4–5.0)
Alkaline Phosphatase: 70 U/L (ref 50–136)
Anion Gap: 6 — ABNORMAL LOW (ref 7–16)
Chloride: 100 mmol/L (ref 98–107)
Creatinine: 0.64 mg/dL (ref 0.60–1.30)
EGFR (Non-African Amer.): >60
Osmolality: 270 (ref 275–301)
Potassium: 4 mmol/L (ref 3.5–5.1)
SGPT (ALT): 26 U/L (ref 12–78)
Sodium: 134 mmol/L — ABNORMAL LOW (ref 136–145)

## 2013-04-22 LAB — TROPONIN I: Troponin-I: <0.02 ng/mL

## 2013-04-22 LAB — PREGNANCY, URINE: Pregnancy Test, Urine: NEGATIVE m[IU]/mL

## 2013-04-22 LAB — LIPASE, BLOOD: Lipase: 55 U/L — ABNORMAL LOW (ref 73–393)

## 2013-04-25 ENCOUNTER — Encounter: Payer: 59 | Admitting: Family Medicine

## 2013-05-03 ENCOUNTER — Other Ambulatory Visit: Payer: Self-pay | Admitting: *Deleted

## 2013-05-03 MED ORDER — METFORMIN HCL 500 MG PO TABS: 500.0000 mg | ORAL_TABLET | Freq: Two times a day (BID) | ORAL | Status: DC

## 2013-05-10 ENCOUNTER — Ambulatory Visit: Payer: 59 | Admitting: Family Medicine

## 2013-05-16 ENCOUNTER — Encounter: Payer: Self-pay | Admitting: Family Medicine

## 2013-05-16 ENCOUNTER — Ambulatory Visit (INDEPENDENT_AMBULATORY_CARE_PROVIDER_SITE_OTHER): Payer: 59 | Admitting: Family Medicine

## 2013-05-16 VITALS — BP 104/62 | HR 83 | Temp 98.0°F | Ht 66.0 in | Wt 261.5 lb

## 2013-05-16 DIAGNOSIS — E669 Obesity, unspecified: Secondary | ICD-10-CM | POA: Insufficient documentation

## 2013-05-16 DIAGNOSIS — E66812 Obesity, class 2: Secondary | ICD-10-CM | POA: Insufficient documentation

## 2013-05-16 DIAGNOSIS — IMO0001 Reserved for inherently not codable concepts without codable children: Secondary | ICD-10-CM

## 2013-05-16 DIAGNOSIS — R112 Nausea with vomiting, unspecified: Secondary | ICD-10-CM

## 2013-05-16 MED ORDER — INSULIN GLARGINE 100 UNIT/ML ~~LOC~~ SOLN: 60.0000 [IU] | Freq: Every day | SUBCUTANEOUS | Status: DC

## 2013-05-16 NOTE — Progress Notes (Signed)
Pre-visit discussion using our clinic review tool. No additional management support is needed unless otherwise documented below in the visit note.  

## 2013-05-16 NOTE — Patient Instructions (Addendum)
Call [facility] [place] [facility] to ask when is the next bariatric surgery seminar. Increase lantus to 60 units at bedtime. Call on Friday day with blood sugar measurements fasting and 2 hour postprandial. Work on low carb diet. Hold the victoza to see if contributing to the nausea. Make follow up with surgeon for nausea and emesis if not better with holding victoza. If any chest pain, if can't keep fluid down or severe symtpoms.. Go to ER.

## 2013-05-16 NOTE — Progress Notes (Signed)
   Subjective:    Patient ID: [name], female    DOB: [birth year], 26 y.o.   MRN: [id number]  HPI 27 year old female with history of DM presents with    She is on metformin, victoza and lantus.  At last OV in  10/28/214 we  increased victoza to  1.2 mcg.  She felt fine after this.   She has been having nausea and occ emesis 2 week after gallbladder surgery on 11/4. Dr. Charlie Pitter in [facility] performed at [facility].. At follow up she was doing well. RUQ pain was resolved.  Also fasting CBG have been elevated at 400-500 ever since the gallbladder surgery.  She has increase lantus to 38 units at bedtime. Occ has taken additional 10 Units in AM At night CBGs are 200-300s   She feels pretty good overall... But dry lips and peeing a lot.  no diarrhea, no fever. No abdominal pain.    Lab Results  Component Value Date   HGBA1C 6.9* 04/07/2013    Wt Readings from Last 3 Encounters:  05/16/13 261 lb 8 oz (118.616 kg)  04/11/13 275 lb (124.739 kg)  01/10/13 267 lb (121.11 kg)      Review of Systems  Constitutional: Negative for fever and fatigue.  HENT: Negative for ear pain.   Eyes: Negative for pain.  Respiratory: Negative for chest tightness and shortness of breath.   Cardiovascular: Negative for chest pain, palpitations and leg swelling.  Gastrointestinal: Negative for abdominal pain.  Genitourinary: Negative for dysuria.       Objective:   Physical Exam  Constitutional: Vital signs are normal. She appears well-developed and well-nourished. She is cooperative.  Non-toxic appearance. She does not appear ill. No distress.  Non toxic appearing morbidly obese.  HENT:  Head: Normocephalic.  Right Ear: Hearing, tympanic membrane, external ear and ear canal normal. Tympanic membrane is not erythematous, not retracted and not bulging.  Left Ear: Hearing, tympanic membrane, external ear and ear canal normal. Tympanic membrane is not erythematous, not retracted and not bulging.  Nose: No  mucosal edema or rhinorrhea. Right sinus exhibits no maxillary sinus tenderness and no frontal sinus tenderness. Left sinus exhibits no maxillary sinus tenderness and no frontal sinus tenderness.  Mouth/Throat: Uvula is midline, oropharynx is clear and moist and mucous membranes are normal.  Eyes: Conjunctivae, EOM and lids are normal. Pupils are equal, round, and reactive to light. Lids are everted and swept, no foreign bodies found.  Neck: Trachea normal and normal range of motion. Neck supple. Carotid bruit is not present. No mass and no thyromegaly present.  Cardiovascular: Normal rate, regular rhythm, S1 normal, S2 normal, normal heart sounds, intact distal pulses and normal pulses.  Exam reveals no gallop and no friction rub.   No murmur heard. Pulmonary/Chest: Effort normal and breath sounds normal. Not tachypneic. No respiratory distress. She has no decreased breath sounds. She has no wheezes. She has no rhonchi. She has no rales.  Abdominal: Soft. Normal appearance and bowel sounds are normal. There is no tenderness.  Neurological: She is alert.  Skin: Skin is warm, dry and intact. No rash noted.  Psychiatric: Her speech is normal and behavior is normal. Judgment and thought content normal. Her mood appears not anxious. Cognition and memory are normal. She does not exhibit a depressed mood.          Assessment & Plan:

## 2013-05-16 NOTE — Assessment & Plan Note (Signed)
She will look into bariatric surgery.

## 2013-05-16 NOTE — Assessment & Plan Note (Signed)
?   Secondary to victoza... Hold this med. If not improving follow up with surgeon as may be seondary to surgery. Also will consider lab eval at that time.   no clear sign of infection at this point.

## 2013-05-16 NOTE — Assessment & Plan Note (Signed)
Very poor control. Increase lantus to 60 units.. Call in few days with CBGs, Continue metformin. If not improving we can add SSI.

## 2013-06-01 ENCOUNTER — Other Ambulatory Visit: Payer: Self-pay | Admitting: Family Medicine

## 2013-06-01 ENCOUNTER — Telehealth: Payer: Self-pay

## 2013-06-01 NOTE — Telephone Encounter (Signed)
Pt prefers the lantus pens and wants to know why was changed to vials.Please advise. [facility] employee pharmacy.

## 2013-06-01 NOTE — Telephone Encounter (Signed)
Unintentional change as far as I am aware unless insurance requested... Send in pens.

## 2013-06-02 MED ORDER — INSULIN GLARGINE 100 UNIT/ML SOLOSTAR PEN: 0.6000 mL | PEN_INJECTOR | Freq: Every day | SUBCUTANEOUS | Status: DC

## 2013-06-04 ENCOUNTER — Emergency Department: Payer: Self-pay | Admitting: Emergency Medicine

## 2013-06-04 LAB — COMPREHENSIVE METABOLIC PANEL
Albumin: 3.7 g/dL (ref 3.4–5.0)
BUN: 12 mg/dL (ref 7–18)
Bilirubin,Total: 0.2 mg/dL (ref 0.2–1.0)
Chloride: 96 mmol/L — ABNORMAL LOW (ref 98–107)
Co2: 28 mmol/L (ref 21–32)
EGFR (African American): >60
Glucose: 434 mg/dL — ABNORMAL HIGH (ref 65–99)
Osmolality: 277 (ref 275–301)
Potassium: 4.2 mmol/L (ref 3.5–5.1)
Sodium: 129 mmol/L — ABNORMAL LOW (ref 136–145)

## 2013-06-04 LAB — URINALYSIS, COMPLETE
Blood: NEGATIVE
Ketone: NEGATIVE
Leukocyte Esterase: NEGATIVE
Nitrite: NEGATIVE
Protein: NEGATIVE
RBC,UR: <1 /HPF (ref 0–5)
Specific Gravity: 1.02 (ref 1.003–1.030)
WBC UR: 1 /HPF (ref 0–5)

## 2013-06-04 LAB — CBC
HCT: 40 % (ref 35.0–47.0)
RBC: 5.15 10*6/uL (ref 3.80–5.20)
RDW: 14 % (ref 11.5–14.5)
WBC: 10.3 10*3/uL (ref 3.6–11.0)

## 2013-06-05 NOTE — Telephone Encounter (Signed)
Pt cking on status of metformin refill; spoke with Valda Lamb at [facility] pharmacy and med ready for pick up. Pt notified,

## 2013-07-19 ENCOUNTER — Ambulatory Visit: Payer: 59 | Admitting: Family Medicine

## 2013-07-26 LAB — HM DIABETES EYE EXAM

## 2013-07-27 ENCOUNTER — Ambulatory Visit (INDEPENDENT_AMBULATORY_CARE_PROVIDER_SITE_OTHER): Payer: 59 | Admitting: Family Medicine

## 2013-07-27 ENCOUNTER — Encounter: Payer: Self-pay | Admitting: Family Medicine

## 2013-07-27 VITALS — BP 120/66 | HR 84 | Temp 98.0°F | Ht 66.75 in | Wt 257.5 lb

## 2013-07-27 DIAGNOSIS — IMO0001 Reserved for inherently not codable concepts without codable children: Secondary | ICD-10-CM

## 2013-07-27 DIAGNOSIS — E1165 Type 2 diabetes mellitus with hyperglycemia: Principal | ICD-10-CM

## 2013-07-27 DIAGNOSIS — I10 Essential (primary) hypertension: Secondary | ICD-10-CM

## 2013-07-27 LAB — HM DIABETES FOOT EXAM

## 2013-07-27 MED ORDER — METFORMIN HCL 1000 MG PO TABS: ORAL_TABLET | ORAL | Status: DC

## 2013-07-27 NOTE — Progress Notes (Signed)
Pre-visit discussion using our clinic review tool. No additional management support is needed unless otherwise documented below in the visit note.  

## 2013-07-27 NOTE — Assessment & Plan Note (Signed)
Very poor control. Double metformin and increase lantus to 90 Units daily.

## 2013-07-27 NOTE — Assessment & Plan Note (Signed)
Well controlled. Continue current medication.  

## 2013-07-27 NOTE — Progress Notes (Signed)
Subjective:    Patient ID: [name], female    DOB: 1986-03-26, 26 y.o.   MRN: [id number]  HPI The patient is here for DM follow up.  Diabetes:  Due for re-eval on metformin and lantus 60 units. Tried 80 Units of insulin on her own, helped minimally.  Was on metformin 1000 mg twice daily has some diarrhea with this. Off victoza N/V went away. Lab Results  Component Value Date   HGBA1C 6.9* 04/07/2013  Using medications without difficulties: Hypoglycemic episodes: none Hyperglycemic episodes: frequent Feet problems tender right heel Blood Sugars averaging: FBS 541, 2 hours after meals > 300 eye exam within last year:uptodate  Hypertension: Well controlled on lisinopril BP Readings from Last 3 Encounters:  07/27/13 120/66  05/16/13 104/62  04/11/13 130/76  Using medication without problems or lightheadedness: None Chest pain with exertion:None Edema:none Short of breath:none Average home BPs:not chekcing Other issues:   Lab Results  Component Value Date   CHOL 115 04/07/2013   HDL 34.30* 04/07/2013   LDLCALC 66 04/07/2013   TRIG 75.0 04/07/2013   CHOLHDL 3 04/07/2013   Diet compliance: Modereate, low carb Exercise: Daily exercise at gym, treadmill for 1 hour. Other complaints:   Review of Systems  Constitutional: Negative for fever and fatigue.       Increase in thirst  HENT: Negative for ear pain.   Eyes: Negative for pain.  Respiratory: Negative for chest tightness and shortness of breath.   Cardiovascular: Negative for chest pain, palpitations and leg swelling.  Gastrointestinal: Negative for abdominal pain.  Genitourinary: Positive for frequency. Negative for dysuria.  Neurological: Negative for headaches.       Objective:   Physical Exam  Constitutional: Vital signs are normal. She appears well-developed and well-nourished. She is cooperative.  Non-toxic appearance. She does not appear ill. No distress.  Non toxic appearing morbidly obese.    HENT:  Head: Normocephalic.  Right Ear: Hearing, tympanic membrane, external ear and ear canal normal. Tympanic membrane is not erythematous, not retracted and not bulging.  Left Ear: Hearing, tympanic membrane, external ear and ear canal normal. Tympanic membrane is not erythematous, not retracted and not bulging.  Nose: No mucosal edema or rhinorrhea. Right sinus exhibits no maxillary sinus tenderness and no frontal sinus tenderness. Left sinus exhibits no maxillary sinus tenderness and no frontal sinus tenderness.  Mouth/Throat: Uvula is midline, oropharynx is clear and moist and mucous membranes are normal.  Eyes: Conjunctivae, EOM and lids are normal. Pupils are equal, round, and reactive to light. Lids are everted and swept, no foreign bodies found.  Neck: Trachea normal and normal range of motion. Neck supple. Carotid bruit is not present. No mass and no thyromegaly present.  Cardiovascular: Normal rate, regular rhythm, S1 normal, S2 normal, normal heart sounds, intact distal pulses and normal pulses.  Exam reveals no gallop and no friction rub.   No murmur heard. Pulmonary/Chest: Effort normal and breath sounds normal. Not tachypneic. No respiratory distress. She has no decreased breath sounds. She has no wheezes. She has no rhonchi. She has no rales.  Abdominal: Soft. Normal appearance and bowel sounds are normal. There is no tenderness.  Neurological: She is alert.  Skin: Skin is warm, dry and intact. No rash noted.  Psychiatric: Her speech is normal and behavior is normal. Judgment and thought content normal. Her mood appears not anxious. Cognition and memory are normal. She does not exhibit a depressed mood.   Diabetic foot exam: Normal inspection No skin  breakdown Bilateral calluses , crack on heel. Normal DP pulses Normal sensation to light touch and monofilament Nails normal         Assessment & Plan:  The patient's preventative maintenance and recommended screening  tests for an annual wellness exam were reviewed in full today. Brought up to date unless services declined.  Counselled on the importance of diet, exercise, and its role in overall health and mortality. The patient's FH and SH was reviewed, including their home life, tobacco status, and drug and alcohol status.   Vaccines:uptodate with PNA and Td. DVE/PAP: Has had PAP at health dept 06/2012 STD screen: neg at health dept

## 2013-07-27 NOTE — Patient Instructions (Addendum)
Increase metfromin to 1000 mg twice daily. Increase lantus to 90 Units daily. File right heel daily small amount, apply antibiotic ointment at bedtime, then place on a sock. Follow up in 1-2 weeks , bring in blood sugar measurements.

## 2013-07-28 ENCOUNTER — Telehealth: Payer: Self-pay

## 2013-07-28 ENCOUNTER — Telehealth: Payer: Self-pay | Admitting: Family Medicine

## 2013-07-28 NOTE — Telephone Encounter (Signed)
Relevant patient education assigned to patient using Emmi. ° °

## 2013-08-11 ENCOUNTER — Ambulatory Visit: Payer: 59 | Admitting: Family Medicine

## 2013-08-11 DIAGNOSIS — Z0289 Encounter for other administrative examinations: Secondary | ICD-10-CM

## 2013-08-23 ENCOUNTER — Other Ambulatory Visit: Payer: Self-pay | Admitting: Family Medicine

## 2013-08-23 MED ORDER — INSULIN GLARGINE 100 UNIT/ML SOLOSTAR PEN: 60.0000 [IU] | PEN_INJECTOR | Freq: Every day | SUBCUTANEOUS | Status: DC

## 2013-08-23 MED ORDER — "PEN NEEDLES 3/16"" 31G X 5 MM MISC": 1.0000 "pen " | Status: DC

## 2013-08-23 NOTE — Telephone Encounter (Signed)
Pt is needing refill on Lantus, HCTZ and needles for her lantus. Pt uses [facility]. I did see where her Hydrochlorithiazide and Lisinopril was sent in but none of the others. Please advise. Pt is completely out of medication she says.

## 2013-09-12 ENCOUNTER — Telehealth: Payer: Self-pay

## 2013-09-12 NOTE — Telephone Encounter (Signed)
NOted

## 2013-09-12 NOTE — Telephone Encounter (Signed)
Pt wants to know when Dr Ermalene SearingBedsole wants to see pt again and pt wants to discuss changing insulin to farxiga. Spoke with pt and pt missed appt on 08/11/13. Pt rescheduled appt 09/21/13 at 3:15 and pt instructed to bring BS log with her. Pt voiced understanding.

## 2013-09-21 ENCOUNTER — Ambulatory Visit: Payer: 59 | Admitting: Family Medicine

## 2013-09-26 ENCOUNTER — Ambulatory Visit: Payer: 59 | Admitting: Family Medicine

## 2013-09-29 ENCOUNTER — Ambulatory Visit: Payer: 59 | Admitting: Family Medicine

## 2013-10-03 ENCOUNTER — Ambulatory Visit (INDEPENDENT_AMBULATORY_CARE_PROVIDER_SITE_OTHER): Payer: 59 | Admitting: Family Medicine

## 2013-10-03 ENCOUNTER — Encounter: Payer: Self-pay | Admitting: Family Medicine

## 2013-10-03 VITALS — BP 120/88 | HR 88 | Temp 98.1°F | Ht 66.75 in | Wt 262.8 lb

## 2013-10-03 DIAGNOSIS — E1165 Type 2 diabetes mellitus with hyperglycemia: Principal | ICD-10-CM

## 2013-10-03 DIAGNOSIS — IMO0001 Reserved for inherently not codable concepts without codable children: Secondary | ICD-10-CM

## 2013-10-03 MED ORDER — METFORMIN HCL 500 MG PO TABS
ORAL_TABLET | ORAL | Status: DC
Start: 2013-10-03 — End: 2015-03-19

## 2013-10-03 MED ORDER — DAPAGLIFLOZIN PROPANEDIOL 5 MG PO TABS: 5.0000 mg | ORAL_TABLET | Freq: Every day | ORAL | Status: DC

## 2013-10-03 NOTE — Assessment & Plan Note (Signed)
To assist with weight loss. Will try  Decreasing metformin and starting farxiga.

## 2013-10-03 NOTE — Progress Notes (Signed)
   Subjective:    Patient ID: [name], female    DOB: Jan 16, 1986, 28 y.o.   MRN: [id number]  HPI  28 year old female with DM presents to discuss DM med changes.    Lab Results  Component Value Date   HGBA1C 6.9* 04/07/2013  At last OV in 07/2013 FBS 541, 2 hours after meals > 300 Rec: Increased lantus to 90 units (split) and doubled metformin.  FBS 99 2 hour postprandial 150-228  She feels like she has increase appetite on higher dose of lantus.  She also has more loose stool on metfominr. Wt Readings from Last 3 Encounters:  10/03/13 262 lb 12 oz (119.183 kg)  07/27/13 257 lb 8 oz (116.801 kg)  05/16/13 261 lb 8 oz (118.616 kg)    She would like to change to farxiga.   She wants something to lose weight.  She is exercising  5 times a week. Eating healthy foods, portion control. She is no longer interested in bariattric surgery.       Review of Systems  Constitutional: Negative for fever and fatigue.  HENT: Negative for ear pain.   Eyes: Negative for pain.  Respiratory: Negative for chest tightness and shortness of breath.   Cardiovascular: Negative for chest pain, palpitations and leg swelling.  Gastrointestinal: Negative for abdominal pain.  Genitourinary: Negative for dysuria.       Objective:   Physical Exam  Constitutional: Vital signs are normal. She appears well-developed and well-nourished. She is cooperative.  Non-toxic appearance. She does not appear ill. No distress.  HENT:  Head: Normocephalic.  Right Ear: Hearing, tympanic membrane, external ear and ear canal normal. Tympanic membrane is not erythematous, not retracted and not bulging.  Left Ear: Hearing, tympanic membrane, external ear and ear canal normal. Tympanic membrane is not erythematous, not retracted and not bulging.  Nose: No mucosal edema or rhinorrhea. Right sinus exhibits no maxillary sinus tenderness and no frontal sinus tenderness. Left sinus exhibits no maxillary sinus  tenderness and no frontal sinus tenderness.  Mouth/Throat: Uvula is midline, oropharynx is clear and moist and mucous membranes are normal.  Eyes: Conjunctivae, EOM and lids are normal. Pupils are equal, round, and reactive to light. Lids are everted and swept, no foreign bodies found.  Neck: Trachea normal and normal range of motion. Neck supple. Carotid bruit is not present. No mass and no thyromegaly present.  Cardiovascular: Normal rate, regular rhythm, S1 normal, S2 normal, normal heart sounds, intact distal pulses and normal pulses.  Exam reveals no gallop and no friction rub.   No murmur heard. Pulmonary/Chest: Effort normal and breath sounds normal. Not tachypneic. No respiratory distress. She has no decreased breath sounds. She has no wheezes. She has no rhonchi. She has no rales.  Abdominal: Soft. Normal appearance and bowel sounds are normal. There is no tenderness.  Neurological: She is alert.  Skin: Skin is warm, dry and intact. No rash noted.  Psychiatric: Her speech is normal and behavior is normal. Judgment and thought content normal. Her mood appears not anxious. Cognition and memory are normal. She does not exhibit a depressed mood.          Assessment & Plan:

## 2013-10-03 NOTE — Progress Notes (Signed)
Pre visit review using our clinic review tool, if applicable. No additional management support is needed unless otherwise documented below in the visit note. 

## 2013-10-03 NOTE — Patient Instructions (Addendum)
Decrease metromin  500 mg twice daily.  Start farxiga 5 mg daily. Call if too expensive. Call with FBS and 2 hours after meals blood sugars in 1-2 weeks.  Schedule 3 month DM follow up with labs prior.

## 2013-12-26 ENCOUNTER — Telehealth: Payer: Self-pay | Admitting: Family Medicine

## 2013-12-26 ENCOUNTER — Other Ambulatory Visit (INDEPENDENT_AMBULATORY_CARE_PROVIDER_SITE_OTHER): Payer: 59

## 2013-12-26 DIAGNOSIS — IMO0001 Reserved for inherently not codable concepts without codable children: Secondary | ICD-10-CM

## 2013-12-26 DIAGNOSIS — E1165 Type 2 diabetes mellitus with hyperglycemia: Principal | ICD-10-CM

## 2013-12-26 DIAGNOSIS — I10 Essential (primary) hypertension: Secondary | ICD-10-CM

## 2013-12-26 LAB — COMPREHENSIVE METABOLIC PANEL
ALK PHOS: 57 U/L (ref 39–117)
ALT: 16 U/L (ref 0–35)
AST: 17 U/L (ref 0–37)
Albumin: 3.3 g/dL — ABNORMAL LOW (ref 3.5–5.2)
BILIRUBIN TOTAL: 0.4 mg/dL (ref 0.2–1.2)
BUN: 7 mg/dL (ref 6–23)
CHLORIDE: 103 meq/L (ref 96–112)
CO2: 27 mEq/L (ref 19–32)
CREATININE: 0.7 mg/dL (ref 0.4–1.2)
Calcium: 8.7 mg/dL (ref 8.4–10.5)
GFR: 122.28 mL/min (ref >60.00)
Glucose, Bld: 285 mg/dL — ABNORMAL HIGH (ref 70–99)
Potassium: 3.7 mEq/L (ref 3.5–5.1)
Sodium: 136 mEq/L (ref 135–145)
Total Protein: 6.7 g/dL (ref 6.0–8.3)

## 2013-12-26 LAB — LIPID PANEL
CHOL/HDL RATIO: 4
Cholesterol: 128 mg/dL (ref 0–200)
HDL: 35.3 mg/dL — AB (ref >39.00)
LDL Cholesterol: 74 mg/dL (ref 0–99)
NonHDL: 92.7
TRIGLYCERIDES: 95 mg/dL (ref 0.0–149.0)
VLDL: 19 mg/dL (ref 0.0–40.0)

## 2013-12-26 LAB — HEMOGLOBIN A1C: Hgb A1c MFr Bld: 9.9 % — ABNORMAL HIGH (ref 4.6–6.5)

## 2013-12-26 NOTE — Telephone Encounter (Signed)
Message copied by Excell SeltzerBEDSOLE, Zayd Bonet E on Tue Dec 26, 2013  8:39 AM ------      Message from: Alvina ChouWALSH, TERRI J      Created: Fri Dec 22, 2013 12:53 PM      Regarding: Lab orders for Tuesday, 7.14.15       Lab orders for a 3 month appt ------

## 2013-12-27 ENCOUNTER — Telehealth: Payer: Self-pay | Admitting: Family Medicine

## 2013-12-27 NOTE — Telephone Encounter (Signed)
Relevant patient education assigned to patient using Emmi. ° °

## 2013-12-29 ENCOUNTER — Telehealth: Payer: Self-pay | Admitting: Family Medicine

## 2013-12-29 ENCOUNTER — Ambulatory Visit (INDEPENDENT_AMBULATORY_CARE_PROVIDER_SITE_OTHER): Payer: 59 | Admitting: Family Medicine

## 2013-12-29 ENCOUNTER — Encounter: Payer: Self-pay | Admitting: Family Medicine

## 2013-12-29 VITALS — Ht 66.75 in

## 2013-12-29 DIAGNOSIS — R079 Chest pain, unspecified: Secondary | ICD-10-CM

## 2013-12-29 DIAGNOSIS — IMO0001 Reserved for inherently not codable concepts without codable children: Secondary | ICD-10-CM

## 2013-12-29 DIAGNOSIS — E1165 Type 2 diabetes mellitus with hyperglycemia: Secondary | ICD-10-CM

## 2013-12-29 NOTE — Progress Notes (Signed)
Pre visit review using our clinic review tool, if applicable. No additional management support is needed unless otherwise documented below in the visit note. 

## 2013-12-29 NOTE — Telephone Encounter (Signed)
Patient Information:  Caller Name: [name]  Phone: [phone]  [name], [name]  Gender: Female  DOB: 03-27-1986  Age: 28 Years  PCP: Kerby NoraBedsole, Amy [facility](Family Practice)  Pregnant: No  Office Follow Up:  Does the office need to follow up with this patient?: No  Instructions For The Office: N/A   Symptoms  Reason For Call & Symptoms: Patient calling for nausea, sharp chest pain and weakness.  FSBG 235.  Onset symptoms 12/28/13. Intermittent chest pains that last estimated 3 minutes intermittently rated up to 8 of 9 of 10.  Vomiting x 4 or 5 episodes associated with the pain and rapid heart beat reported.  Last episode 06:00. See Today in Office per Chest Pain guideline due to All other patients with chest pain.  Caller relates she had similar symptoms when diabetes was diagnosed.  Reviewed Health History In EMR: Yes  Reviewed Medications In EMR: Yes  Reviewed Allergies In EMR: Yes  Reviewed Surgeries / Procedures: Yes  Date of Onset of Symptoms: 12/28/2013 OB / GYN:  LMP: Unknown  Guideline(s) Used:  Chest Pain  Disposition Per Guideline:   See Today in Office  Reason For Disposition Reached:   All other patients with chest pain  Advice Given:  Call Back If:  Constant chest pain lasting longer than 5 minutes  Difficulty breathing  Fever  You become worse.  Patient Will Follow Care Advice:  YES  Appointment Scheduled:  12/29/2013 12:15:00 Appointment Scheduled Provider:  Kerby NoraBedsole, [facility](Family Practice)

## 2013-12-29 NOTE — Telephone Encounter (Signed)
Noted  

## 2014-01-02 ENCOUNTER — Encounter: Payer: Self-pay | Admitting: Family Medicine

## 2014-01-02 ENCOUNTER — Ambulatory Visit (INDEPENDENT_AMBULATORY_CARE_PROVIDER_SITE_OTHER): Payer: 59 | Admitting: Family Medicine

## 2014-01-02 VITALS — BP 140/100 | HR 87 | Temp 98.1°F | Ht 66.75 in | Wt 279.0 lb

## 2014-01-02 DIAGNOSIS — E1165 Type 2 diabetes mellitus with hyperglycemia: Principal | ICD-10-CM

## 2014-01-02 DIAGNOSIS — I10 Essential (primary) hypertension: Secondary | ICD-10-CM

## 2014-01-02 DIAGNOSIS — IMO0001 Reserved for inherently not codable concepts without codable children: Secondary | ICD-10-CM

## 2014-01-02 LAB — HM DIABETES FOOT EXAM

## 2014-01-02 MED ORDER — EXENATIDE 5 MCG/0.02ML ~~LOC~~ SOPN: 5.0000 ug | PEN_INJECTOR | Freq: Two times a day (BID) | SUBCUTANEOUS | Status: DC

## 2014-01-02 NOTE — Progress Notes (Signed)
Pre visit review using our clinic review tool, if applicable. No additional management support is needed unless otherwise documented below in the visit note. 

## 2014-01-02 NOTE — Assessment & Plan Note (Signed)
Trial of byetta and increase insulin lantus to 55 UNits BID. Offered endo referral. She will consider if this plan not efective. Encouraged exercise, weight loss, healthy eating habits.

## 2014-01-02 NOTE — Patient Instructions (Addendum)
Restart lisinopril and HCTZ. Follow BP at home. Increase the insulin to 55 Units Twice daily. Start Byetta twice daily. Follow up in 2 weeks OV for DM. Bring in blood sugar measurements.  Keep working on low carb diet.

## 2014-01-02 NOTE — Assessment & Plan Note (Signed)
Restart BP meds 

## 2014-01-02 NOTE — Progress Notes (Signed)
The patient is here for DM follow up.   Diabetes: Worsened control on metformin and lantus 45 Units BID units.  She tried farxiga but along with lisinopril it caused headache. Tried 80 Units of insulin on her own, helped minimally.  Was on metformin 1000 mg twice daily has some diarrhea with this.  Off victoza N/V went away.  Lab Results  Component Value Date   HGBA1C 9.9* 12/26/2013  Using medications without difficulties:  Hypoglycemic episodes: none  Hyperglycemic episodes: frequent  Feet problems tender right heel  Blood Sugars averaging: FBS 260-332, 2 hours after meals 300s eye exam within last year:uptodate   Hypertension: previousuly well  controlled on lisinopril, HCTZ. She has not been taking meds given headache. BP Readings from Last 3 Encounters:  01/02/14 140/100  10/03/13 120/88  07/27/13 120/66   Using medication without problems or lightheadedness: None  Chest pain with exertion:None  Edema:none  Short of breath:none  Average home BPs:not checking  Other issues:  Lab Results  Component Value Date   CHOL 128 12/26/2013   HDL 35.30* 12/26/2013   LDLCALC 74 12/26/2013   TRIG 95.0 12/26/2013   CHOLHDL 4 12/26/2013  She has been working had on healthy eating. Exercise: Daily exercise at gym, treadmill for 1 hour.  Other complaints:  Wt Readings from Last 3 Encounters:  01/02/14 279 lb (126.554 kg)  10/03/13 262 lb 12 oz (119.183 kg)  07/27/13 257 lb 8 oz (116.801 kg)     Review of Systems  Constitutional: Negative for fever and fatigue.  Increase in thirst  HENT: Negative for ear pain.  Eyes: Negative for pain.  Respiratory: Negative for chest tightness and shortness of breath.  Cardiovascular: Negative for chest pain, palpitations and leg swelling.  Gastrointestinal: Negative for abdominal pain.  Genitourinary: Positive for frequency. Negative for dysuria.  Neurological: Negative for headaches.  Objective:   Physical Exam  Constitutional: Vital signs  are normal. She appears well-developed and well-nourished. She is cooperative. Non-toxic appearance. She does not appear ill. No distress.  Non toxic appearing morbidly obese.  HENT:  Head: Normocephalic.  Right Ear: Hearing, tympanic membrane, external ear and ear canal normal. Tympanic membrane is not erythematous, not retracted and not bulging.  Left Ear: Hearing, tympanic membrane, external ear and ear canal normal. Tympanic membrane is not erythematous, not retracted and not bulging.  Nose: No mucosal edema or rhinorrhea. Right sinus exhibits no maxillary sinus tenderness and no frontal sinus tenderness. Left sinus exhibits no maxillary sinus tenderness and no frontal sinus tenderness.  Mouth/Throat: Uvula is midline, oropharynx is clear and moist and mucous membranes are normal.  Eyes: Conjunctivae, EOM and lids are normal. Pupils are equal, round, and reactive to light. Lids are everted and swept, no foreign bodies found.  Neck: Trachea normal and normal range of motion. Neck supple. Carotid bruit is not present. No mass and no thyromegaly present.  Cardiovascular: Normal rate, regular rhythm, S1 normal, S2 normal, normal heart sounds, intact distal pulses and normal pulses. Exam reveals no gallop and no friction rub.  No murmur heard.  Pulmonary/Chest: Effort normal and breath sounds normal. Not tachypneic. No respiratory distress. She has no decreased breath sounds. She has no wheezes. She has no rhonchi. She has no rales.  Abdominal: Soft. Normal appearance and bowel sounds are normal. There is no tenderness.  Neurological: She is alert.  Skin: Skin is warm, dry and intact. No rash noted.  Psychiatric: Her speech is normal and behavior is normal. Judgment  and thought content normal. Her mood appears not anxious. Cognition and memory are normal. She does not exhibit a depressed mood.   Diabetic foot exam:  Normal inspection  No skin breakdown  Bilateral calluses , crack on heel.   Normal DP pulses  Normal sensation to light touch and monofilament  Nails normal

## 2014-01-09 ENCOUNTER — Emergency Department: Payer: Self-pay | Admitting: Emergency Medicine

## 2014-01-09 LAB — DRUG SCREEN, URINE

## 2014-01-09 LAB — COMPREHENSIVE METABOLIC PANEL
ALBUMIN: 3.5 g/dL (ref 3.4–5.0)
ALK PHOS: 87 U/L
ANION GAP: 8 (ref 7–16)
BILIRUBIN TOTAL: 0.4 mg/dL (ref 0.2–1.0)
BUN: 8 mg/dL (ref 7–18)
CREATININE: 0.66 mg/dL (ref 0.60–1.30)
Calcium, Total: 8.8 mg/dL (ref 8.5–10.1)
Chloride: 102 mmol/L (ref 98–107)
Co2: 25 mmol/L (ref 21–32)
EGFR (African American): >60
Glucose: 254 mg/dL — ABNORMAL HIGH (ref 65–99)
Osmolality: 277 (ref 275–301)
Potassium: 3.6 mmol/L (ref 3.5–5.1)
SGOT(AST): 20 U/L (ref 15–37)
SGPT (ALT): 32 U/L
SODIUM: 135 mmol/L — AB (ref 136–145)
Total Protein: 8.1 g/dL (ref 6.4–8.2)

## 2014-01-09 LAB — URINALYSIS, COMPLETE
Bilirubin,UR: NEGATIVE
Glucose,UR: >=500 mg/dL (ref 0–75)
Ketone: NEGATIVE
NITRITE: NEGATIVE
Ph: 5 (ref 4.5–8.0)
Protein: 30
RBC,UR: 266 /HPF (ref 0–5)
SPECIFIC GRAVITY: 1.022 (ref 1.003–1.030)

## 2014-01-09 LAB — HCG, QUANTITATIVE, PREGNANCY: Beta Hcg, Quant.: <1 m[IU]/mL — ABNORMAL LOW

## 2014-01-09 LAB — WET PREP, GENITAL

## 2014-01-09 LAB — GC/CHLAMYDIA PROBE AMP

## 2014-01-09 LAB — CBC
HCT: 43.8 % (ref 35.0–47.0)
HGB: 14 g/dL (ref 12.0–16.0)
MCH: 25.4 pg — ABNORMAL LOW (ref 26.0–34.0)
MCHC: 31.9 g/dL — ABNORMAL LOW (ref 32.0–36.0)
MCV: 80 fL (ref 80–100)
PLATELETS: 294 10*3/uL (ref 150–440)
RBC: 5.51 10*6/uL — ABNORMAL HIGH (ref 3.80–5.20)
RDW: 14 % (ref 11.5–14.5)
WBC: 9.2 10*3/uL (ref 3.6–11.0)

## 2014-01-09 LAB — PREGNANCY, URINE: PREGNANCY TEST, URINE: NEGATIVE m[IU]/mL

## 2014-01-09 LAB — MONONUCLEOSIS SCREEN: MONO TEST: NEGATIVE

## 2014-01-09 LAB — LIPASE, BLOOD: Lipase: 62 U/L — ABNORMAL LOW (ref 73–393)

## 2014-01-09 LAB — MAGNESIUM: MAGNESIUM: 1.8 mg/dL

## 2014-01-16 ENCOUNTER — Ambulatory Visit (INDEPENDENT_AMBULATORY_CARE_PROVIDER_SITE_OTHER): Payer: 59 | Admitting: Family Medicine

## 2014-01-16 ENCOUNTER — Encounter: Payer: Self-pay | Admitting: Family Medicine

## 2014-01-16 VITALS — BP 126/62 | HR 93 | Temp 98.4°F | Ht 66.75 in | Wt 272.8 lb

## 2014-01-16 DIAGNOSIS — E1165 Type 2 diabetes mellitus with hyperglycemia: Principal | ICD-10-CM

## 2014-01-16 DIAGNOSIS — I10 Essential (primary) hypertension: Secondary | ICD-10-CM

## 2014-01-16 DIAGNOSIS — IMO0001 Reserved for inherently not codable concepts without codable children: Secondary | ICD-10-CM

## 2014-01-16 NOTE — Progress Notes (Signed)
The patient is here for DM follow up.   Diabetes: Much improved today. At last OV increased insulin to 55 Units and started Byetta. Continued low dose metformin.  In past : She tried farxiga but along with lisinopril it caused headache.  Was on metformin 1000 mg twice daily has some diarrhea with this.  Was on victoza .[name]. Stopped due to N/V.  Lab Results   Component  Value  Date    HGBA1C  9.9*  12/26/2013   Using medications without difficulties:  Hypoglycemic episodes: none  Hyperglycemic episodes: frequent  Feet problems: no ulcers just cracked skin. Blood Sugars averaging: FBS 98-120, 2 hours after meals 150s eye exam within last year:uptodate   Hypertension: Well controlled on lisinopril, HCTZ.  No headaches. BP Readings from Last 3 Encounters:  01/16/14 126/62  01/02/14 140/100  10/03/13 120/88  Using medication without problems or lightheadedness: None  Chest pain with exertion:None  Edema:none  Short of breath:none  Average home BPs:not checking  Other issues:   Exercise:  She is exercising at gym 4 days a week.   Wt Readings from Last 3 Encounters:  01/16/14 272 lb 12 oz (123.719 kg)  01/02/14 279 lb (126.554 kg)  10/03/13 262 lb 12 oz (119.183 kg)    Review of Systems  Constitutional: Negative for fever and fatigue.  HENT: Negative for ear pain.  Eyes: Negative for pain.  Respiratory: Negative for chest tightness and shortness of breath.  Cardiovascular: Negative for chest pain, palpitations and leg swelling.  Gastrointestinal: Negative for abdominal pain.  Genitourinary:Negative for dysuria.  Neurological: Negative for headaches.  Objective:   Physical Exam  Constitutional: Vital signs are normal. She appears well-developed and well-nourished. She is cooperative. Non-toxic appearance. She does not appear ill. No distress.  Non toxic appearing morbidly obese.  HENT:  Head: Normocephalic.  Right Ear: Hearing, tympanic membrane, external ear and ear canal  normal. Tympanic membrane is not erythematous, not retracted and not bulging.  Left Ear: Hearing, tympanic membrane, external ear and ear canal normal. Tympanic membrane is not erythematous, not retracted and not bulging.  Nose: No mucosal edema or rhinorrhea. Right sinus exhibits no maxillary sinus tenderness and no frontal sinus tenderness. Left sinus exhibits no maxillary sinus tenderness and no frontal sinus tenderness.  Mouth/Throat: Uvula is midline, oropharynx is clear and moist and mucous membranes are normal.  Eyes: Conjunctivae, EOM and lids are normal. Pupils are equal, round, and reactive to light. Lids are everted and swept, no foreign bodies found.  Neck: Trachea normal and normal range of motion. Neck supple. Carotid bruit is not present. No mass and no thyromegaly present.  Cardiovascular: Normal rate, regular rhythm, S1 normal, S2 normal, normal heart sounds, intact distal pulses and normal pulses. Exam reveals no gallop and no friction rub.  No murmur heard.  Pulmonary/Chest: Effort normal and breath sounds normal. Not tachypneic. No respiratory distress. She has no decreased breath sounds. She has no wheezes. She has no rhonchi. She has no rales.  Abdominal: Soft. Normal appearance and bowel sounds are normal. There is no tenderness.  Neurological: She is alert.  Skin: Skin is warm, dry and intact. No rash noted.   Psychiatric: Her speech is normal and behavior is normal. Judgment and thought content normal. Her mood appears not anxious. Cognition and memory are normal. She does not exhibit a depressed mood.    Foot exam: dry cracking skin on B heels, right > left.

## 2014-01-16 NOTE — Progress Notes (Signed)
Pre visit review using our clinic review tool, if applicable. No additional management support is needed unless otherwise documented below in the visit note. 

## 2014-01-16 NOTE — Assessment & Plan Note (Signed)
Improved control on higher dose insulin and starting byetta. Continue current dose byetta given SE to victoza in past but consider  Increasing at follow up in 3 months if not continuing to lose weight. Encouraged exercise, weight loss, healthy eating habits.

## 2014-01-16 NOTE — Patient Instructions (Addendum)
Apply Kerasol ointment to heels at night, then wear socks to sleep. Continue insulin at 55 UNits twice daily. Continue byetta at 5  Twice daily. Follow blood sugars at home.

## 2014-01-25 NOTE — Progress Notes (Signed)
Cancelled appt

## 2014-04-08 ENCOUNTER — Emergency Department: Payer: Self-pay | Admitting: Emergency Medicine

## 2014-04-08 ENCOUNTER — Telehealth: Payer: Self-pay | Admitting: Family Medicine

## 2014-04-08 DIAGNOSIS — E1365 Other specified diabetes mellitus with hyperglycemia: Principal | ICD-10-CM

## 2014-04-08 DIAGNOSIS — IMO0001 Reserved for inherently not codable concepts without codable children: Secondary | ICD-10-CM

## 2014-04-08 LAB — COMPREHENSIVE METABOLIC PANEL
ALK PHOS: 123 U/L — AB
ALT: 40 U/L
Albumin: 3.4 g/dL (ref 3.4–5.0)
Anion Gap: 11 (ref 7–16)
BILIRUBIN TOTAL: 0.2 mg/dL (ref 0.2–1.0)
BUN: 7 mg/dL (ref 7–18)
CALCIUM: 9.1 mg/dL (ref 8.5–10.1)
CHLORIDE: 94 mmol/L — AB (ref 98–107)
Co2: 26 mmol/L (ref 21–32)
Creatinine: 0.91 mg/dL (ref 0.60–1.30)
EGFR (African American): >60
EGFR (Non-African Amer.): >60
Glucose: 562 mg/dL (ref 65–99)
OSMOLALITY: 286 (ref 275–301)
Potassium: 3.8 mmol/L (ref 3.5–5.1)
SGOT(AST): 22 U/L (ref 15–37)
SODIUM: 131 mmol/L — AB (ref 136–145)
TOTAL PROTEIN: 7.6 g/dL (ref 6.4–8.2)

## 2014-04-08 LAB — URINALYSIS, COMPLETE
BACTERIA: NONE SEEN
Bilirubin,UR: NEGATIVE
Blood: NEGATIVE
Glucose,UR: >=500 mg/dL (ref 0–75)
KETONE: NEGATIVE
Nitrite: NEGATIVE
PH: 6 (ref 4.5–8.0)
PROTEIN: NEGATIVE
RBC,UR: 3 /HPF (ref 0–5)
SPECIFIC GRAVITY: 1.02 (ref 1.003–1.030)
Squamous Epithelial: 1
WBC UR: 2 /HPF (ref 0–5)

## 2014-04-08 LAB — CBC
HCT: 40.6 % (ref 35.0–47.0)
HGB: 12.6 g/dL (ref 12.0–16.0)
MCH: 24.4 pg — AB (ref 26.0–34.0)
MCHC: 31.1 g/dL — AB (ref 32.0–36.0)
MCV: 78 fL — ABNORMAL LOW (ref 80–100)
Platelet: 248 10*3/uL (ref 150–440)
RBC: 5.18 10*6/uL (ref 3.80–5.20)
RDW: 13.8 % (ref 11.5–14.5)
WBC: 5.5 10*3/uL (ref 3.6–11.0)

## 2014-04-08 NOTE — Telephone Encounter (Signed)
Message copied by Excell SeltzerBEDSOLE, Kenasia Scheller E on Sun Apr 08, 2014 11:15 PM ------      Message from: Alvina ChouWALSH, TERRI J      Created: Wed Apr 04, 2014  4:30 PM      Regarding: Lab orders for Tuesday, 10.27.15       Lab orders for a 3 month f/u ------

## 2014-04-11 ENCOUNTER — Other Ambulatory Visit (INDEPENDENT_AMBULATORY_CARE_PROVIDER_SITE_OTHER): Payer: 59

## 2014-04-11 DIAGNOSIS — IMO0001 Reserved for inherently not codable concepts without codable children: Secondary | ICD-10-CM

## 2014-04-11 DIAGNOSIS — E1365 Other specified diabetes mellitus with hyperglycemia: Secondary | ICD-10-CM

## 2014-04-12 LAB — HEMOGLOBIN A1C: Hgb A1c MFr Bld: 13.5 % — ABNORMAL HIGH (ref 4.6–6.5)

## 2014-04-13 ENCOUNTER — Encounter: Payer: Self-pay | Admitting: Family Medicine

## 2014-04-13 ENCOUNTER — Ambulatory Visit (INDEPENDENT_AMBULATORY_CARE_PROVIDER_SITE_OTHER): Payer: 59 | Admitting: Family Medicine

## 2014-04-13 VITALS — BP 110/70 | HR 99 | Temp 98.3°F | Ht 66.75 in | Wt 255.8 lb

## 2014-04-13 DIAGNOSIS — I10 Essential (primary) hypertension: Secondary | ICD-10-CM

## 2014-04-13 DIAGNOSIS — L02214 Cutaneous abscess of groin: Secondary | ICD-10-CM

## 2014-04-13 DIAGNOSIS — Z3202 Encounter for pregnancy test, result negative: Secondary | ICD-10-CM

## 2014-04-13 DIAGNOSIS — E1365 Other specified diabetes mellitus with hyperglycemia: Secondary | ICD-10-CM

## 2014-04-13 DIAGNOSIS — IMO0001 Reserved for inherently not codable concepts without codable children: Secondary | ICD-10-CM

## 2014-04-13 LAB — POCT URINE PREGNANCY: PREG TEST UR: NEGATIVE

## 2014-04-13 MED ORDER — INSULIN GLARGINE 100 UNIT/ML SOLOSTAR PEN: 55.0000 [IU] | PEN_INJECTOR | Freq: Two times a day (BID) | SUBCUTANEOUS | Status: DC

## 2014-04-13 MED ORDER — DOXYCYCLINE HYCLATE 100 MG PO TABS: 100.0000 mg | ORAL_TABLET | Freq: Two times a day (BID) | ORAL | Status: DC

## 2014-04-13 MED ORDER — EXENATIDE 10 MCG/0.04ML ~~LOC~~ SOPN: 10.0000 ug | PEN_INJECTOR | Freq: Two times a day (BID) | SUBCUTANEOUS | Status: DC

## 2014-04-13 NOTE — Progress Notes (Signed)
The patient is here for DM follow up.   She had been somewhat nauseous. Has not had menses since August.  Sexually active.  U preg was neg.  Diabetes: Worsened control  On insulin to 45 Units  Twice daily.  She has been out of Byetta x week, but was tolerating well.Continued low dose metformin.  In past : She tried farxiga but along with lisinopril it caused headache.  Was on metformin 1000 mg twice daily has some diarrhea with this.  Was on victoza .Marland Kitchen. Stopped due to N/V.   Lab Results  Component Value Date   HGBA1C 13.5* 04/11/2014  Using medications without difficulties:  Hypoglycemic episodes: none  Hyperglycemic episodes: frequent  Feet problems: no ulcers just cracked skin.  Blood Sugars averaging: FBS 300, 2 hours after meals 300 eye exam within last year: uptodate   She went to hospital recently with  CBG 683. Given fluids. Not told to change her regimen at all.  She does have boils in her grpoin area off and on. She has 5-6 at this time.  She feels likely she has been doing well with exercise and healthy low carb diet.   Hypertension: Well controlled on lisinopril, HCTZ. No headaches.  BP Readings from Last 3 Encounters:  04/13/14 110/70  01/16/14 126/62  01/02/14 140/100  Using medication without problems or lightheadedness: None  Chest pain with exertion:None  Edema:none  Short of breath:none  Average home BPs:not checking  Other issues:  Exercise: She is exercising at gym 4 days a week.  Wt Readings from Last 3 Encounters:  04/13/14 255 lb 12 oz (116.007 kg)  01/16/14 272 lb 12 oz (123.719 kg)  01/02/14 279 lb (126.554 kg)    Review of Systems  Constitutional: Negative for fever and fatigue.  HENT: Negative for ear pain.  Eyes: Negative for pain.  Respiratory: Negative for chest tightness and shortness of breath.  Cardiovascular: Negative for chest pain, palpitations and leg swelling.  Gastrointestinal: Negative for abdominal pain.   Genitourinary:Negative for dysuria.  Neurological: Negative for headaches.  Objective:   Physical Exam  Constitutional: Vital signs are normal. She appears well-developed and well-nourished. She is cooperative. Non-toxic appearance. She does not appear ill. No distress.  Non toxic appearing morbidly obese.  HENT:  Head: Normocephalic.  Right Ear: Hearing, tympanic membrane, external ear and ear canal normal. Tympanic membrane is not erythematous, not retracted and not bulging.  Left Ear: Hearing, tympanic membrane, external ear and ear canal normal. Tympanic membrane is not erythematous, not retracted and not bulging.  Nose: No mucosal edema or rhinorrhea. Right sinus exhibits no maxillary sinus tenderness and no frontal sinus tenderness. Left sinus exhibits no maxillary sinus tenderness and no frontal sinus tenderness.  Mouth/Throat: Uvula is midline, oropharynx is clear and moist and mucous membranes are normal.  Eyes: Conjunctivae, EOM and lids are normal. Pupils are equal, round, and reactive to light. Lids are everted and swept, no foreign bodies found.  Neck: Trachea normal and normal range of motion. Neck supple. Carotid bruit is not present. No mass and no thyromegaly present.  Cardiovascular: Normal rate, regular rhythm, S1 normal, S2 normal, normal heart sounds, intact distal pulses and normal pulses. Exam reveals no gallop and no friction rub.  No murmur heard.  Pulmonary/Chest: Effort normal and breath sounds normal. Not tachypneic. No respiratory distress. She has no decreased breath sounds. She has no wheezes. She has no rhonchi. She has no rales.  Abdominal: Soft. Normal appearance and bowel sounds  are normal. There is no tenderness.  Neurological: She is alert.  Skin: Skin is warm, dry and intact. No rash noted.  Multiple small boils in groin, minimal fluctuance. Psychiatric: Her speech is normal and behavior is normal. Judgment and thought content normal. Her mood appears not  anxious. Cognition and memory are normal. She does not exhibit a depressed mood.    Foot exam: dry cracking skin on B heels, right > left.

## 2014-04-13 NOTE — Patient Instructions (Addendum)
Increase Byetta to 10 mcg daily. Increase Lantus to 55 Units twice a daily. Stop at front desk to set up endocrine referral. Check blood sugars  2 times  A day fasting and 2 hour after meals. Call with measurements in next 1-2 weeks. Schedule CPX with no labs prior in the next few weeks to months. Complete antibiotics.

## 2014-04-13 NOTE — Assessment & Plan Note (Signed)
Well controlled. Continue current medication.  

## 2014-04-13 NOTE — Assessment & Plan Note (Signed)
No clear need for I and D at [facility]ie point.  Likely increasing sugar some.  Will treat with doxy x 10 days. Change to antibacterial soap.

## 2014-04-13 NOTE — Assessment & Plan Note (Signed)
Increase byetta. Increase insulin to 55 untis BID ( may need to try trial of change to levemir as she may respond to this better).  refer to endo.  Will treat current skin infeciton.

## 2014-04-13 NOTE — Progress Notes (Signed)
Pre visit review using our clinic review tool, if applicable. No additional management support is needed unless otherwise documented below in the visit note. 

## 2014-04-17 ENCOUNTER — Ambulatory Visit: Payer: 59 | Admitting: Family Medicine

## 2014-04-25 ENCOUNTER — Ambulatory Visit: Payer: 59 | Admitting: Endocrinology

## 2014-06-11 ENCOUNTER — Emergency Department: Payer: Self-pay | Admitting: Student

## 2014-06-11 LAB — URINALYSIS, COMPLETE
BACTERIA: NONE SEEN
BILIRUBIN, UR: NEGATIVE
Blood: NEGATIVE
Glucose,UR: >=500 mg/dL (ref 0–75)
KETONE: NEGATIVE
Nitrite: NEGATIVE
PH: 5 (ref 4.5–8.0)
Protein: NEGATIVE
Specific Gravity: 1.01 (ref 1.003–1.030)
WBC UR: 9 /HPF (ref 0–5)

## 2014-06-11 LAB — CBC WITH DIFFERENTIAL/PLATELET
BASOS ABS: 0.1 10*3/uL (ref 0.0–0.1)
BASOS PCT: 0.6 %
EOS ABS: 0.1 10*3/uL (ref 0.0–0.7)
EOS PCT: 0.9 %
HCT: 39.7 % (ref 35.0–47.0)
HGB: 12.6 g/dL (ref 12.0–16.0)
LYMPHS ABS: 3.5 10*3/uL (ref 1.0–3.6)
Lymphocyte %: 32.2 %
MCH: 24.7 pg — AB (ref 26.0–34.0)
MCHC: 31.7 g/dL — AB (ref 32.0–36.0)
MCV: 78 fL — AB (ref 80–100)
Monocyte #: 0.8 x10 3/mm (ref 0.2–0.9)
Monocyte %: 7.7 %
Neutrophil #: 6.3 10*3/uL (ref 1.4–6.5)
Neutrophil %: 58.6 %
PLATELETS: 261 10*3/uL (ref 150–440)
RBC: 5.09 10*6/uL (ref 3.80–5.20)
RDW: 15.9 % — ABNORMAL HIGH (ref 11.5–14.5)
WBC: 10.7 10*3/uL (ref 3.6–11.0)

## 2014-06-11 LAB — COMPREHENSIVE METABOLIC PANEL
ALK PHOS: 88 U/L
ANION GAP: 7 (ref 7–16)
AST: 15 U/L (ref 15–37)
Albumin: 3.3 g/dL — ABNORMAL LOW (ref 3.4–5.0)
BILIRUBIN TOTAL: 0.2 mg/dL (ref 0.2–1.0)
BUN: 5 mg/dL — AB (ref 7–18)
CHLORIDE: 101 mmol/L (ref 98–107)
Calcium, Total: 9 mg/dL (ref 8.5–10.1)
Co2: 27 mmol/L (ref 21–32)
Creatinine: 0.76 mg/dL (ref 0.60–1.30)
EGFR (Non-African Amer.): >60
Glucose: 318 mg/dL — ABNORMAL HIGH (ref 65–99)
Osmolality: 280 (ref 275–301)
Potassium: 4 mmol/L (ref 3.5–5.1)
SGPT (ALT): 23 U/L
SODIUM: 135 mmol/L — AB (ref 136–145)
Total Protein: 7.6 g/dL (ref 6.4–8.2)

## 2014-06-11 LAB — HCG, QUANTITATIVE, PREGNANCY: Beta Hcg, Quant.: <1 m[IU]/mL — ABNORMAL LOW

## 2014-06-11 LAB — WET PREP, GENITAL

## 2014-06-14 LAB — URINE CULTURE

## 2014-07-02 ENCOUNTER — Telehealth: Payer: Self-pay | Admitting: Family Medicine

## 2014-07-02 DIAGNOSIS — E119 Type 2 diabetes mellitus without complications: Secondary | ICD-10-CM

## 2014-07-02 NOTE — Telephone Encounter (Signed)
-----   Message from Alvina Chouerri J Walsh sent at 07/02/2014  4:26 PM EST ----- Regarding: Lab orders for Tuesday, 1.19.16 Patient is scheduled for f/u  labs, please order future labs, Thanks , Camelia Engerri

## 2014-07-05 ENCOUNTER — Other Ambulatory Visit: Payer: 59

## 2014-07-12 ENCOUNTER — Ambulatory Visit: Payer: 59 | Admitting: Family Medicine

## 2014-07-23 ENCOUNTER — Other Ambulatory Visit: Payer: Self-pay | Admitting: Family Medicine

## 2014-08-01 ENCOUNTER — Ambulatory Visit: Payer: 59 | Admitting: Family Medicine

## 2014-08-17 ENCOUNTER — Ambulatory Visit: Payer: 59 | Admitting: Family Medicine

## 2014-08-17 ENCOUNTER — Emergency Department: Payer: Self-pay | Admitting: Student

## 2014-08-21 ENCOUNTER — Encounter: Payer: Self-pay | Admitting: Family Medicine

## 2014-08-21 ENCOUNTER — Ambulatory Visit (INDEPENDENT_AMBULATORY_CARE_PROVIDER_SITE_OTHER): Payer: 59 | Admitting: Family Medicine

## 2014-08-21 VITALS — BP 140/80 | HR 110 | Temp 98.8°F | Ht 66.75 in | Wt 260.0 lb

## 2014-08-21 DIAGNOSIS — I10 Essential (primary) hypertension: Secondary | ICD-10-CM

## 2014-08-21 DIAGNOSIS — E119 Type 2 diabetes mellitus without complications: Secondary | ICD-10-CM

## 2014-08-21 NOTE — Assessment & Plan Note (Signed)
Well controlled. Continue current medication.  

## 2014-08-21 NOTE — Progress Notes (Signed)
Pre visit review using our clinic review tool, if applicable. No additional management support is needed unless otherwise documented below in the visit note. 

## 2014-08-21 NOTE — Patient Instructions (Addendum)
Decrease gatorade. Increase water. Decrease pasta, bread, candy. Increase fiber and protein with each meal.  Stop at lab on way out.  Increase activtiy. 150 min per week of exercise.  look into bariatric surgery.

## 2014-08-21 NOTE — Progress Notes (Signed)
29 year old female  presents for follow up DM.  [facility]. Had fever, back pain and low abd pain. CBG was 279. Cipro x 10 days.. She is on this now. Symptoms resolved on antibiotics.   Diabetes: Due for re-eval. On insulin to 55 Unitstwice daily. On byetta 10 . Continued low dose metformin.  She occ does not take given SE. In past : She tried farxiga but along with lisinopril it caused headache.  Was on metformin 1000 mg twice daily has some diarrhea with this.  Was on victoza .Marland Kitchen. Stopped due to N/V.   Lab Results  Component Value Date   HGBA1C 13.5* 04/11/2014  Hypoglycemic episodes: occ 70 Hyperglycemic episodes: frequent  Feet problems: no ulcers just cracked skin.  Blood Sugars averaging: FBS 140-279, 2 hours after meals 250 eye exam within last year: uptodate   She feels likely she has been doing well healthy low carb diet.  No exercise. Walking at work.   Hypertension: Well controlled on lisinopril, HCTZ. No headaches.  BP Readings from Last 3 Encounters:  08/21/14 140/80  04/13/14 110/70  01/16/14 126/62  Chest pain with exertion:None  Edema:none  Short of breath:none  Average home BPs:not checking  Other issues:  Exercise: She is exercising at gym 4 days a week.  Wt Readings from Last 3 Encounters:  08/21/14 260 lb (117.935 kg)  04/13/14 255 lb 12 oz (116.007 kg)  01/16/14 272 lb 12 oz (123.719 kg)   BP Readings from Last 3 Encounters:  08/21/14 140/80  04/13/14 110/70  01/16/14 126/62  Estimated body mass index is 41.05 kg/(m^2) as calculated from the following:   Height as of this encounter: 5' 6.75" (1.695 m).   Weight as of this encounter: 260 lb (117.935 kg). Review of Systems  Constitutional: Negative for fever and fatigue.  HENT: Negative for ear pain.  Eyes: Negative for pain.  Respiratory: Negative for chest tightness and shortness of breath.  Cardiovascular: Negative for chest  pain, palpitations and leg swelling.  Gastrointestinal: Negative for abdominal pain.  Genitourinary:Negative for dysuria.  Neurological: Negative for headaches.  Objective:   Physical Exam  Constitutional: Vital signs are normal. She appears well-developed and well-nourished. She is cooperative. Non-toxic appearance. She does not appear ill. No distress.  Non toxic appearing morbidly obese.  HENT:  Head: Normocephalic.  Right Ear: Hearing, tympanic membrane, external ear and ear canal normal. Tympanic membrane is not erythematous, not retracted and not bulging.  Left Ear: Hearing, tympanic membrane, external ear and ear canal normal. Tympanic membrane is not erythematous, not retracted and not bulging.  Nose: No mucosal edema or rhinorrhea. Right sinus exhibits no maxillary sinus tenderness and no frontal sinus tenderness. Left sinus exhibits no maxillary sinus tenderness and no frontal sinus tenderness.  Mouth/Throat: Uvula is midline, oropharynx is clear and moist and mucous membranes are normal.  Eyes: Conjunctivae, EOM and lids are normal. Pupils are equal, round, and reactive to light. Lids are everted and swept, no foreign bodies found.  Neck: Trachea normal and normal range of motion. Neck supple. Carotid bruit is not present. No mass and no thyromegaly present.  Cardiovascular: Normal rate, regular rhythm, S1 normal, S2 normal, normal heart sounds, intact distal pulses and normal pulses. Exam reveals no gallop and no friction rub.  No murmur heard.  Pulmonary/Chest: Effort normal and breath sounds normal. Not tachypneic. No respiratory distress. She has no decreased breath sounds. She has no  wheezes. She has no rhonchi. She has no rales.  Abdominal: Soft. Normal appearance and bowel sounds are normal. There is no tenderness.  Neurological: She is alert.  Skin: Skin is warm, dry and intact. No rash noted. Multiple small boils in groin, minimal fluctuance. Psychiatric:  Her speech is normal and behavior is normal. Judgment and thought content normal. Her mood appears not anxious. Cognition and memory are normal. She does not exhibit a depressed mood.    Foot exam: dry cracking skin on B heels, right > left.  nml monofilament.

## 2014-08-21 NOTE — Addendum Note (Signed)
Addended by: Alvina ChouWALSH, Paytience Bures J on: 08/21/2014 05:06 PM   Modules accepted: Orders

## 2014-08-21 NOTE — Assessment & Plan Note (Signed)
Very poor control on lantus 55 units twice daily, max byetta and metformin daily. Due for A1`C. Likely will need to increase insulin.  Counseled for 15 min on DM complicaitons, need for low carb diet, weight loss and exercise.  Recommended bariatric surgery.

## 2014-08-22 LAB — COMPREHENSIVE METABOLIC PANEL
ALBUMIN: 3.9 g/dL (ref 3.5–5.2)
ALK PHOS: 91 U/L (ref 39–117)
ALT: 19 U/L (ref 0–35)
AST: 12 U/L (ref 0–37)
BUN: 9 mg/dL (ref 6–23)
CO2: 25 mEq/L (ref 19–32)
Calcium: 9.3 mg/dL (ref 8.4–10.5)
Chloride: 95 mEq/L — ABNORMAL LOW (ref 96–112)
Creatinine, Ser: 0.8 mg/dL (ref 0.40–1.20)
GFR: 111.19 mL/min (ref >60.00)
Glucose, Bld: 447 mg/dL — ABNORMAL HIGH (ref 70–99)
POTASSIUM: 3.5 meq/L (ref 3.5–5.1)
Sodium: 129 mEq/L — ABNORMAL LOW (ref 135–145)
TOTAL PROTEIN: 7.5 g/dL (ref 6.0–8.3)
Total Bilirubin: 0.2 mg/dL (ref 0.2–1.2)

## 2014-08-22 LAB — LIPID PANEL
CHOLESTEROL: 121 mg/dL (ref 0–200)
HDL: 31.3 mg/dL — ABNORMAL LOW (ref >39.00)
LDL CALC: 58 mg/dL (ref 0–99)
NONHDL: 89.7
Total CHOL/HDL Ratio: 4
Triglycerides: 158 mg/dL — ABNORMAL HIGH (ref 0.0–149.0)
VLDL: 31.6 mg/dL (ref 0.0–40.0)

## 2014-08-22 LAB — HEMOGLOBIN A1C: HEMOGLOBIN A1C: 12.4 % — AB (ref 4.6–6.5)

## 2014-10-02 NOTE — Consult Note (Signed)
Chief Complaint and History:   Referring Physician Dr. Cherlynn KaiserSainani    Chief Complaint Uncontrolled diabetes   Allergies:  Tomato: Hives, Swelling  Assessment/Plan:   Assessment/Plan 29 yo F seen in consultation for severe hyperglycemia without ketosis. She was seen, examined, and chart was reviewed. Sugars are in the 180-205 range on IV insulin at a current rate of 3.5 units/hr. She is morbidly obese.   A/  Severely uncontrolled diabetes Morbid obesity  P/ Start metformin 500 mg bid and glipizide XL 10 mg bid. Start Lantus 10 units qhs one time Once sugars <120, stop IV insulin and change from q1 hour FSBS to qACHS FSBS Continue liquid diet for now, potentially advance tomorrow Am as tol  Will follow with you. Full consult has been dictated.    Case Discussed With patient   Electronic Signatures: Raj JanusSolum, Anna M (MD)  (Signed [phone]-Nov-13 17:31)  Authored: Chief Complaint and History, ALLERGIES, Assessment/Plan   Last Updated: 12-Nov-13 17:31 by Raj JanusSolum, Anna M (MD)

## 2014-10-02 NOTE — Discharge Summary (Signed)
PATIENT NAME:  [name], [name] MR#:  [id number] DATE OF BIRTH:  02-02-1986  DATE OF ADMISSION:  04/26/2012 DATE OF DISCHARGE:  04/27/2012  For a detailed note, please take a look at the history and physical done on admission.   DISCHARGE DIAGNOSES: 1. Nonketotic hyperglycemia.  2. Uncontrolled diabetes mellitus. 3. Hypertension.  4. Hyponatremia.  5. Leukocytosis.   DIET: The patient is being discharged on a low-sodium, American Diabetic Association Diet.   ACTIVITY: As tolerated.   DISCHARGE FOLLOWUP: Followup is with Dr. Tedd SiasSolum in the next 1 to 2 weeks.   DISCHARGE MEDICATIONS:  1. Hydrochlorothiazide 12.5 mg daily.  2. Lisinopril 20 mg 2 tabs twice a day. 3. Glipizide 10 mg twice a day. 4. Metformin 500 mg twice a day. 5. Lantus SoloSTAR pen 10 units subcutaneous at bedtime.   CONSULTANTS:  Carlena SaxAnna Solum, MD - Endocrinology.   PERTINENT STUDIES: Chest x-ray done on admission is showing no acute cardiopulmonary disease.   BRIEF HOSPITAL COURSE: This is a 29 year old female with a history of type 1 diabetes who presented to the hospital with nausea, vomiting, and poor p.o. intake, and noted to have severe hyperglycemia with blood sugars over 700s, but with a normal pH and no ketones; therefore, she came here with nonketotic hyperglycemia.  1. Nonketotic hyperglycemia. Likely the cause of the patient's nausea, vomiting, lethargy, and poor p.o. intake. The patient was admitted to the hospital and started on an insulin drip and also aggressive IV fluids. She has eventually been tapered off the insulin drip and placed on Lantus along with some p.o. supplements. Her blood sugars have significantly improved, her clinical symptoms have improved, and she has had no further nausea or vomiting while being in the hospital and has been able to tolerate p.o. diet well and therefore is being discharged home on the regimen stated above.  2. Hyponatremia. This was likely pseudohyponatremia from her  hyperglycemia. It has since improved and resolved, as her sugars have improved.  3. Leukocytosis. This is likely stress mediated from the hyperglycemia. It has also now come back down to normal and is at baseline.  4. Hypertension. The patient's antihypertensives were held in the hospital as she was severely dehydrated from the hyperglycemia, although she will resume her HCTZ and lisinopril upon discharge.   CODE STATUS: THE PATIENT IS A FULL CODE.   TIME SPENT: 40 minutes.  ____________________________ Rolly PancakeVivek J. Cherlynn KaiserSainani, MD vjs:slb [name]: 04/27/2012 14:48:37 ET T: 04/27/2012 16:13:52 ET JOB#: [id number]  cc: Rolly PancakeVivek J. Cherlynn KaiserSainani, MD, <Dictator> A. Wendall MolaMelissa Solum, MD [place] SirenVIVEK J Amarys Sliwinski MD ELECTRONICALLY SIGNED 04/28/2012 8:11

## 2014-10-02 NOTE — H&P (Signed)
PATIENT NAME:  [name], [name] MR#:  [id number] DATE OF BIRTH:  09/21/1985  DATE OF ADMISSION:  04/26/2012  PRIMARY CARE PHYSICIAN: [facility]  CHIEF COMPLAINT: Nausea, vomiting, and elevated blood sugars.   HISTORY OF PRESENT ILLNESS: This is a 29 year old female who presented to the emergency room due to persistent nausea and vomiting for the past 3 to 4 days and her blood sugars being significantly high. The patient was here in the ER last week, was switched from metformin to glipizide, but was not able to take the glipizide as she continued to have persistent nausea and vomiting. She has not taken any p.o. diabetic medications now for the past five days and has had poor p.o. intake. Today she was feeling increasingly weak, lethargic, and started feeling dizzy when she was trying to ambulate. She therefore came to the ER for further evaluation. In the emergency room, the patient was noted to be significantly hyperglycemic with blood sugars greater than 700. Hospitalist services were contacted for further treatment and evaluation. The patient apparently had been on insulin in the past, was taken off of insulin in December of last year and started on metformin. Her sugars are running high as the middle of this past year and she was trying to adjust her medications accordingly with the doctor at the [facility] since they were not improving. She likely does not need to be on oral medications and likely needs to be reinitiated back on insulin.   REVIEW OF SYSTEMS: CONSTITUTIONAL: No documented fever. No weight gain or weight loss. EYES: No blurred or double vision. ENT: No tinnitus. No postnasal drip. No redness of the oropharynx. RESPIRATORY: No cough, no wheeze, no hemoptysis, and no dyspnea. CARDIOVASCULAR: No chest pain, no orthopnea, no palpitations, and no syncope. GASTROINTESTINAL: Positive nausea. Positive vomiting. No diarrhea, no abdominal pain, and no melena or hematochezia. GU: No  dysuria and no hematuria. ENDOCRINE: No polyuria, no nocturia, and no heat or cold intolerance. HEME: No anemia, no bruising, and no bleeding. INTEGUMENTARY: No rashes and no lesions. MUSCULOSKELETAL: No arthritis, no swelling, and no gout. NEUROLOGIC: No numbness, no tingling, no ataxia, and no seizure-type activity. PSYCH: No anxiety, no insomnia, and no ADD.   PAST MEDICAL HISTORY:  1. Hypertension. 2. Diabetes.   ALLERGIES: No known drug allergies.   SOCIAL HISTORY: No smoking. No alcohol abuse. No illicit drug abuse.   FAMILY HISTORY: Her mother is alive and healthy, no medical problems. Father had a history of diabetes. Diabetes does tend to run in the mother's side of the family.   CURRENT MEDICATIONS:  1. Glipizide 5 mg daily.  2. Hydrochlorothiazide/lisinopril 12.5/20 mg two tabs daily.   PHYSICAL EXAMINATION:  VITAL SIGNS: Temperature 98.2, pulse 104, respirations 18, blood pressure 136/73, and saturation 99% on room air.   GENERAL: She is a pleasant appearing female in no apparent distress.   HEENT: Atraumatic, normocephalic. Her extraocular muscles are intact. Pupils are equal and reactive to light. Sclerae anicteric. No conjunctival injection. No pharyngeal erythema.   NECK: Supple. No jugular venous distention, no bruits, no lymphadenopathy, and no thyromegaly.   HEART: Regular rate and rhythm. No murmurs, no rubs, and no clicks.   LUNGS: Clear to auscultation bilaterally. No rales, rhonchi, or wheezes.   ABDOMEN: Soft, flat, nontender, and nondistended. Has good bowel sounds. No hepatosplenomegaly appreciated.   EXTREMITIES: No evidence of any cyanosis, clubbing, or peripheral edema. Has +2 pedal and radial pulses bilaterally.   NEUROLOGICAL: The  patient is alert, awake, and oriented x3 with no focal motor or sensory deficits appreciated bilaterally.   SKIN: Moist and warm with no rash appreciated.   LYMPHATIC: There is no cervical or axillary lymphadenopathy.    LABS/RADIOLOGIC STUDIES: Serum glucose 743, BUN 14, creatinine 1.1, sodium 125, potassium 4.5, chloride 87, and bicarbonate 24. LFTs are within normal limits. Hemoglobin A1c 13.3. White cell count 12.6, hemoglobin 14.1, hematocrit 44.0, and platelet count 374.   Urinalysis is within normal limits.   The patient did have a chest x-ray done which showed no evidence of any acute cardiopulmonary disease.   ASSESSMENT AND PLAN: This is a 29 year old female with history of diabetes, hypertension, and obesity who presents to the hospital with nausea and vomiting and uncontrolled blood sugars and noted to be in nonketotic hyperglycemic state.  1. Nonketotic hyperglycemia. This is likely the cause of the patient's nausea, vomiting, and significantly uncontrolled blood sugars. The patient was on oral medications, but was not able to tolerate due to the nausea and vomiting. She has been on insulin in the past but taken off of it and started on metformin in December of last year. I think she likely needs to go back on insulin. For now I agree with giving her insulin drip, giving her aggressive IV fluids, and following every one hour fingersticks and blood sugars. Hemoglobin A1c is 13, therefore, her diabetes is significantly uncontrolled. I will get an endocrinology consult to assist with management. Follow her blood sugars closely and transition her to Lantus and NovoLog with meals upon weaning her off the insulin drip.  2. Hyponatremia, likely pseudohyponatremia related to hyperglycemia. Should improve when the sugar improves.  3. Nausea and vomiting. This is likely related to her uncontrolled blood sugars. Continue supportive care with IV fluids and antiemetics and follow her clinically.  4. Leukocytosis. This is likely stress mediated from her elevated blood sugars. We will follow her white cell count.  5. Hypertension, currently normotensive. She is a little bit in a dehydrated state given her hyperglycemia.  Therefore, I will hold her HCTZ and lisinopril for now.         CODE STATUS: THE PATIENT IS FULL CODE.   TIME SPENT: 50 minutes.  ____________________________ Rolly PancakeVivek J. Cherlynn KaiserSainani, MD vjs:slb D: 04/26/2012 09:51:50 ET T: 04/26/2012 10:25:18 ET JOB#: [id number]  cc: Rolly PancakeVivek J. Cherlynn KaiserSainani, MD, <Dictator> [facility] [place] SirenVIVEK J SAINANI MD ELECTRONICALLY SIGNED 04/28/2012 8:11

## 2014-10-02 NOTE — Consult Note (Signed)
PATIENT NAME:  [name], [name] MR#:  [id number] DATE OF BIRTH:  07/07/1985  DATE OF CONSULTATION:  04/26/2012  REFERRING PHYSICIAN:  Vivek J. Sainani, MD  CONSULTING PHYSICIAN:  A. Melissa Solum, MD  CHIEF COMPLAINT: Uncontrolled diabetes.   HISTORY OF PRESENT ILLNESS: This is a 29-year-old female who was admitted earlier today with severe hyperglycemia with sugars over 700. She had had 3 to 4 days of nausea, vomiting, and poor p.o. intake. She has been seen in the ED two times in the last two weeks for concerns of severe hyperglycemia. She was diagnosed with diabetes last year, in July 2012. She was followed at the [facility] She was initially managed with insulin, however at some point insulin was stopped and she was managed with metformin alone. She stopped going to the [facility] this summer. Up until one week ago, she was taking only metformin; but then at an ED visit on 04/21/2012 it was her understanding that she should stop the metformin and replace it with glipizide 5 mg daily. She monitors her blood sugars at least one time daily. In October, she had a urinary tract infection with dysuria, which has since resolved. She denies any fevers. She denies any abdominal pain.   PAST MEDICAL HISTORY:  1. Diabetes mellitus, type 2.  2. Hypertension.  3. Morbid obesity.   OUTPATIENT MEDICATIONS:  1. Glipizide 5 mg daily.  2. HCTZ/lisinopril 12.5/20 mg, 2 tabs daily.   ALLERGIES: No known drug allergies.   SOCIAL HISTORY: The patient has one son, age 3. She lives with her son. She is single. No tobacco or alcohol use. She is employed at a call center.   FAMILY HISTORY: Father has diabetes.   PAST SURGICAL HISTORY: C-section November 2010.    REVIEW OF SYSTEMS: GENERAL: She has had some weight loss for the last four months, amount not known. No fevers. HEENT: No blurred vision. No sore throat. NECK: No dysphagia. No pain. CARDIAC: No chest pain, no palpitation. PULMONARY: No  cough, no shortness of breath. ABDOMEN: No abdominal pain. Nausea has resolved. GENITOURINARY: No dysuria or hematuria. EXTREMITIES: No edema. NEUROLOGIC: No recent falls. No tremor. ENDOCRINE: No heat or cold intolerance.   PHYSICAL EXAMINATION:  VITAL SIGNS: Height 65.9 inches, weight 280 pounds or 127 kg, BMI 45.2. Pulse 86, respirations 19, blood pressure 142/57, pulse oximetry 100% on room air.   GENERAL: This is a morbidly obese American female in no acute distress.   HEENT: Extraocular movements are intact. Oropharynx is clear. Mucous membranes are moist.   NECK: Supple. No thyromegaly.   CARDIAC: No carotid bruit. Regular rate and rhythm. No murmur.   PULMONARY: Clear to auscultation bilaterally. No wheeze.   ABDOMEN: Diffusely soft, nontender, nondistended.   EXTREMITIES: No edema is present.   NEUROLOGIC: Sensation intact to light touch throughout. No tremor.   SKIN: Acanthosis nigricans is present on the neck. No rash.   PSYCHIATRIC: Alert and oriented x3, calm and cooperative.   LABORATORY, DIAGNOSTIC AND RADIOLOGICAL DATA: At 7:00 a.m. this morning, labs showed glucose 743, BUN 14, creatinine 1.1, sodium 125, potassium 4.5, chloride 87, eGFR greater than 60. Hemoglobin A1c 13.3%. Total protein 9, AST 10, ALT 28.0. WBC 12.6, hemoglobin 14.0, hematocrit 44.1, platelets 374.0. Urinalysis with greater than 500 glucose, negative nitrite, negative leukocyte esterase, trace ketones. Pregnancy test negative. Venous pH 7.3.   ASSESSMENT: This is a 29-year-old female with uncontrolled diabetes admitted with severe hyperglycemia without ketosis.   RECOMMENDATIONS:    1. As she has normal kidney function, I recommend initiation of metformin 500 mg b.i.d.  2. Additionally, restart glipizide but would give 10 mg b.i.d.  3    Will give a one-time dose of Lantus 10 units tonight.  4. Continue fingerstick blood sugars every hour for now. Once fingerstick blood sugars are less than 120 on  two sequential checks, her IV insulin should be stopped.  5. Continue IV fluids for now.  6. Continue liquid diet; anticipate advancing this tomorrow as tolerated.   Thank you for the kind request for consultation. I will follow along with you.   ____________________________ A. Melissa Solum, MD ams:cbb D: 04/26/2012 17:37:59 ET T: 04/27/2012 07:30:28 ET JOB#: [id number]  cc: A. Melissa Solum, MD, <Dictator> A. MELISSA SOLUM MD ELECTRONICALLY SIGNED 04/28/2012 16:18 

## 2014-10-05 NOTE — Op Note (Signed)
PATIENT NAME:  [name], [name] MR#:  [id number] DATE OF BIRTH:  1985/12/12  DATE OF PROCEDURE:  04/18/2013  PREOPERATIVE DIAGNOSES: Cholelithiasis and cholecystitis.   POSTOPERATIVE DIAGNOSES: Cholelithiasis and cholecystitis.   OPERATION: Robotic-assisted laparoscopic cholecystectomy.   SURGEON: Quentin Orealph L. Ely III, MD   ANESTHESIA: General.   OPERATIVE PROCEDURE: With the patient in the supine position after the induction of appropriate general anesthesia, the patient's abdomen was prepped with ChloraPrep and draped with sterile towels. She was placed in the head down, feet up position. A small infraumbilical incision was made in the standard fashion and carried down bluntly through the subcutaneous tissue. A Veress needle was used to cannulate the peritoneal cavity. CO2 was insufflated to appropriate pressure measurements. When approximately 2.5 liters of CO2 were instilled, the Veress needle was withdrawn and an 11 mm [employer] port was inserted into the peritoneal cavity. Intraperitoneal position was confirmed and CO2 was re-insufflated. Using the 30 degree scope, 2 lateral ports, 5 mm in size, were placed for the robot arms. An assistant port was placed laterally in the right upper quadrant, 5 mm in size, again under direct vision. The [employer] system was brought to the table and docked with the patient. The instruments were inserted and I turned to the console. We used the 0 degree scope for the dissection. The gallbladder was grasped with the assistant grasper and elevated. Dissection was carried along the hepatoduodenal ligament. The cystic artery and cystic duct were identified. The IC-Green was used to identify the cystic and common bile duct. The cystic duct was doubly clipped on the common duct side and divided. The cystic artery could not be identified. The area was carefully searched and I believe the cystic artery was part of the cystic duct division. The gallbladder was then  dissected free from its bed in the liver using hook and cautery apparatus. Once the gallbladder was free, the robot was undocked and the camera was moved to one of the lateral ports. The gallbladder was grasped through the umbilical port with an Endo Catch apparatus and removed through the umbilical port. The abdomen was then desufflated. All ports were withdrawn without difficulty. Skin incisions were closed with 5-0 nylon. The area was infiltrated with 0.25% Marcaine for postoperative pain control. Sterile dressings were applied. The patient was returned to the recovery room, having tolerated the procedure well. Sponge, instrument and needle counts were correct x 2 in the operating room.    ____________________________ Quentin Orealph L. Ely III, MD rle:jm [name]: 04/18/2013 14:00:02 ET T: 04/18/2013 14:22:14 ET JOB#: [id number]  cc: Quentin Orealph L. Ely III, MD, <Dictator> Quentin OreALPH L ELY MD ELECTRONICALLY SIGNED 04/21/2013 22:42

## 2014-10-17 ENCOUNTER — Other Ambulatory Visit: Payer: Self-pay | Admitting: Bariatrics

## 2014-10-17 DIAGNOSIS — E108 Type 1 diabetes mellitus with unspecified complications: Secondary | ICD-10-CM

## 2014-10-18 ENCOUNTER — Other Ambulatory Visit: Payer: Self-pay | Admitting: Bariatrics

## 2014-10-19 ENCOUNTER — Other Ambulatory Visit
Admission: RE | Admit: 2014-10-19 | Discharge: 2014-10-19 | Disposition: A | Discharge status: Home / Self Care | Payer: 59 | Source: Ambulatory Visit | Attending: Bariatrics | Admitting: Bariatrics

## 2014-10-19 DIAGNOSIS — R638 Other symptoms and signs concerning food and fluid intake: Secondary | ICD-10-CM | POA: Insufficient documentation

## 2014-10-19 DIAGNOSIS — E559 Vitamin D deficiency, unspecified: Secondary | ICD-10-CM | POA: Insufficient documentation

## 2014-10-19 LAB — CBC WITH DIFFERENTIAL/PLATELET
BASOS ABS: 0 10*3/uL (ref 0–0.1)
Basophils Relative: 0 %
EOS ABS: 0.1 10*3/uL (ref 0–0.7)
EOS PCT: 1 %
HEMATOCRIT: 36.6 % (ref 35.0–47.0)
Hemoglobin: 12.1 g/dL (ref 12.0–16.0)
LYMPHS ABS: 2.8 10*3/uL (ref 1.0–3.6)
LYMPHS PCT: 28 %
MCH: 24.8 pg — ABNORMAL LOW (ref 26.0–34.0)
MCHC: 33.1 g/dL (ref 32.0–36.0)
MCV: 74.9 fL — ABNORMAL LOW (ref 80.0–100.0)
Monocytes Absolute: 0.8 10*3/uL (ref 0.2–0.9)
Monocytes Relative: 8 %
Neutro Abs: 6.2 10*3/uL (ref 1.4–6.5)
Neutrophils Relative %: 63 %
PLATELETS: 267 10*3/uL (ref 150–440)
RBC: 4.89 MIL/uL (ref 3.80–5.20)
RDW: 14.7 % — AB (ref 11.5–14.5)
WBC: 9.9 10*3/uL (ref 3.6–11.0)

## 2014-10-19 LAB — COMPREHENSIVE METABOLIC PANEL
ALT: 20 U/L (ref 14–54)
AST: 17 U/L (ref 15–41)
Albumin: 3.6 g/dL (ref 3.5–5.0)
Alkaline Phosphatase: 90 U/L (ref 38–126)
Anion gap: 9 (ref 5–15)
BUN: 7 mg/dL (ref 6–20)
CALCIUM: 8.8 mg/dL — AB (ref 8.9–10.3)
CO2: 25 mmol/L (ref 22–32)
CREATININE: 0.66 mg/dL (ref 0.44–1.00)
Chloride: 99 mmol/L — ABNORMAL LOW (ref 101–111)
GFR calc Af Amer: >60 mL/min (ref >60)
Glucose, Bld: 451 mg/dL — ABNORMAL HIGH (ref 65–99)
Potassium: 3.4 mmol/L — ABNORMAL LOW (ref 3.5–5.1)
SODIUM: 133 mmol/L — AB (ref 135–145)
TOTAL PROTEIN: 7.2 g/dL (ref 6.5–8.1)
Total Bilirubin: 0.4 mg/dL (ref 0.3–1.2)

## 2014-10-19 LAB — PROTIME-INR
INR: 0.96
Prothrombin Time: 13 seconds (ref 11.4–15.0)

## 2014-10-19 LAB — APTT: aPTT: 26 seconds (ref 24–36)

## 2014-10-19 LAB — FOLATE: FOLATE: 11.8 ng/mL (ref >5.9)

## 2014-10-19 LAB — VITAMIN B12: Vitamin B-12: 128 pg/mL — ABNORMAL LOW (ref 180–914)

## 2014-10-19 LAB — TSH: TSH: 0.997 u[IU]/mL (ref 0.350–4.500)

## 2014-10-19 LAB — MAGNESIUM: Magnesium: 1.7 mg/dL (ref 1.7–2.4)

## 2014-10-19 LAB — FERRITIN: Ferritin: 22 ng/mL (ref 11–307)

## 2014-10-20 LAB — MISC LABCORP TEST (SEND OUT): LABCORP TEST CODE: 121200

## 2014-10-20 LAB — PREALBUMIN: PREALBUMIN: 16 mg/dL — AB (ref 18–38)

## 2014-10-20 LAB — PARATHYROID HORMONE, INTACT (NO CA): PTH: 49 pg/mL (ref 15–65)

## 2014-10-20 LAB — HEMOGLOBIN A1C: HEMOGLOBIN A1C: 14.7 % — AB (ref 4.0–6.0)

## 2014-10-22 LAB — MISC LABCORP TEST (SEND OUT): Labcorp test code: 81000

## 2014-10-22 LAB — VITAMIN B1: Vitamin B1 (Thiamine): 94.5 nmol/L (ref 66.5–200.0)

## 2014-10-23 LAB — VITAMIN D 1,25 DIHYDROXY
VITAMIN D 1, 25 (OH) TOTAL: 40 pg/mL
Vitamin D2 1, 25 (OH)2: <10 pg/mL
Vitamin D3 1, 25 (OH)2: 40 pg/mL

## 2014-10-24 ENCOUNTER — Ambulatory Visit: Payer: 59

## 2014-10-24 ENCOUNTER — Ambulatory Visit: Payer: 59 | Attending: Bariatrics

## 2014-10-30 ENCOUNTER — Ambulatory Visit: Payer: 59 | Attending: Internal Medicine

## 2014-10-30 DIAGNOSIS — I10 Essential (primary) hypertension: Secondary | ICD-10-CM | POA: Diagnosis present

## 2014-10-30 DIAGNOSIS — G4733 Obstructive sleep apnea (adult) (pediatric): Secondary | ICD-10-CM | POA: Diagnosis not present

## 2014-10-30 DIAGNOSIS — E669 Obesity, unspecified: Secondary | ICD-10-CM | POA: Insufficient documentation

## 2014-10-30 DIAGNOSIS — R0683 Snoring: Secondary | ICD-10-CM | POA: Diagnosis present

## 2014-10-31 ENCOUNTER — Encounter: Payer: Self-pay | Admitting: Dietician

## 2014-10-31 ENCOUNTER — Encounter: Payer: 59 | Attending: Bariatrics | Admitting: Dietician

## 2014-10-31 NOTE — Progress Notes (Signed)
Medical Nutrition Therapy: Visit start time: Time: 8-9:00am- Initial visit for pre-bariatric surgery nutrition evaluation. Assessment:  Diagnosis: obesity,  Past medical history: hypertension, Type 2 diabetes Psychosocial issues/ stress concerns: none reported Preferred learning method:  Jill Alexanders. Visual . Hands-on  Current weight: 265 lbs  Height: 66.7 in Medications, supplements: see list Progress and evaluation: Pt. plans to have "sleeve bariatric surgery". She reports she has had difficulty with her blood sugar control and reports her last HgA1c was between 9 and 10 and she is hoping that surgery will help to improve this. She works full-time 5 days/week, goes to school 2 days/week and works PT 3-4 evenings per week. She eats breakfast and lunch in the hospital cafeteria and is making an effort to make healthier choices. She states that she doesn't cook and picks up most of the evening meals, usually fast foods 5-6 days/week. She also has a 536 year old child. Physical activity: gym, 3 days/week for 1 hour  Dietary Intake:  Usual eating pattern includes 3meals and 1 snacks per day. Dining out frequency: 15 meals per week.  Breakfast: oatmeal, 2 eggs, 1 piece toast, juice, water Snack: peanut butter/crackers or candy Lunch: grilled chicken salad, or Malaw[place]urkey sandwich or baked fish with green vegetable. Snack: usually none Supper: chicken nuggets/fries/sometimes soda or hamburger, fries or chicken sandwich, fries Snack: usually none Beverages: water mainly, up to 8 cups/day; juice and soda-several x/week  Nutrition Care Education: Diabetes:  Instructed on a meal plan based on 1700 calories including  appropriate meal and snack schedule, Carb counting, appropriate carb intake and balance Other lifestyle changes:  benefits of making changes, increasing motivation, readiness for change, identifying habits that need to change,  Nutritional Diagnosis:  NI-5.8.2 Excessive carbohydrate intake As related  to large portions, frequent dining out.  As evidenced by diet history. Excessive Fat intake as well.  Intervention:  Exercise at gym 5 days/week. Omit juice from diet. Change from white bread to whole wheat. Estimate carbohydrate servings at meals and snacks. Prepare  more evening meals at home vs. Picking up fast food.   Education Materials given:  . Food lists/ Planning A Balanced Meal . Sample meal pattern/ menus . Goals/ instructions  Learner/ who was taught:  . Patient   Level of understanding: . Partial understanding; needs review/ practice  Learning barriers: . None  Willingness to learn/ readiness for change: . Acceptance, ready for change  Monitoring and Evaluation:  Dietary intake, exercise, and body weight , 11/21/14 at 8:15am

## 2014-10-31 NOTE — Patient Instructions (Signed)
Exercise at gym 5 days/week. Omit juice from diet. Change from white bread to whole wheat. Estimate carbohydrate servings at meals and snacks. Prepare  more evening meals at home vs. Picking up fast food.

## 2014-11-02 ENCOUNTER — Encounter: Payer: Self-pay | Admitting: Emergency Medicine

## 2014-11-02 ENCOUNTER — Emergency Department: Payer: 59

## 2014-11-02 ENCOUNTER — Emergency Department
Admission: EM | Admit: 2014-11-02 | Discharge: 2014-11-02 | Disposition: A | Discharge status: Home / Self Care | Payer: 59 | Attending: Emergency Medicine | Admitting: Emergency Medicine

## 2014-11-02 DIAGNOSIS — Z79899 Other long term (current) drug therapy: Secondary | ICD-10-CM | POA: Insufficient documentation

## 2014-11-02 DIAGNOSIS — I10 Essential (primary) hypertension: Secondary | ICD-10-CM | POA: Diagnosis not present

## 2014-11-02 DIAGNOSIS — E1165 Type 2 diabetes mellitus with hyperglycemia: Secondary | ICD-10-CM | POA: Diagnosis not present

## 2014-11-02 DIAGNOSIS — Z794 Long term (current) use of insulin: Secondary | ICD-10-CM | POA: Insufficient documentation

## 2014-11-02 DIAGNOSIS — M546 Pain in thoracic spine: Secondary | ICD-10-CM | POA: Diagnosis present

## 2014-11-02 DIAGNOSIS — M542 Cervicalgia: Secondary | ICD-10-CM | POA: Insufficient documentation

## 2014-11-02 LAB — CBC WITH DIFFERENTIAL/PLATELET
BASOS ABS: 0.1 10*3/uL (ref 0–0.1)
BASOS PCT: 1 %
EOS ABS: 0.1 10*3/uL (ref 0–0.7)
Eosinophils Relative: 1 %
HCT: 38 % (ref 35.0–47.0)
HEMOGLOBIN: 12.1 g/dL (ref 12.0–16.0)
LYMPHS ABS: 3.3 10*3/uL (ref 1.0–3.6)
Lymphocytes Relative: 32 %
MCH: 24.1 pg — AB (ref 26.0–34.0)
MCHC: 32 g/dL (ref 32.0–36.0)
MCV: 75.4 fL — ABNORMAL LOW (ref 80.0–100.0)
Monocytes Absolute: 0.7 10*3/uL (ref 0.2–0.9)
Monocytes Relative: 7 %
Neutro Abs: 6.2 10*3/uL (ref 1.4–6.5)
Neutrophils Relative %: 59 %
PLATELETS: 325 10*3/uL (ref 150–440)
RBC: 5.03 MIL/uL (ref 3.80–5.20)
RDW: 15 % — ABNORMAL HIGH (ref 11.5–14.5)
WBC: 10.5 10*3/uL (ref 3.6–11.0)

## 2014-11-02 LAB — COMPREHENSIVE METABOLIC PANEL
ALK PHOS: 85 U/L (ref 38–126)
ALT: 20 U/L (ref 14–54)
ANION GAP: 8 (ref 5–15)
AST: 18 U/L (ref 15–41)
Albumin: 3.8 g/dL (ref 3.5–5.0)
BUN: 8 mg/dL (ref 6–20)
CALCIUM: 8.9 mg/dL (ref 8.9–10.3)
CO2: 25 mmol/L (ref 22–32)
Chloride: 103 mmol/L (ref 101–111)
Creatinine, Ser: 0.71 mg/dL (ref 0.44–1.00)
GFR calc non Af Amer: >60 mL/min (ref >60)
Glucose, Bld: 321 mg/dL — ABNORMAL HIGH (ref 65–99)
Potassium: 3.9 mmol/L (ref 3.5–5.1)
SODIUM: 136 mmol/L (ref 135–145)
TOTAL PROTEIN: 7.7 g/dL (ref 6.5–8.1)
Total Bilirubin: 0.2 mg/dL — ABNORMAL LOW (ref 0.3–1.2)

## 2014-11-02 LAB — GLUCOSE, CAPILLARY: GLUCOSE-CAPILLARY: 311 mg/dL — AB (ref 65–99)

## 2014-11-02 MED ORDER — CYCLOBENZAPRINE HCL 10 MG PO TABS: 10.0000 mg | ORAL_TABLET | Freq: Three times a day (TID) | ORAL | Status: DC | PRN

## 2014-11-02 NOTE — ED Notes (Signed)
C/o upper back pain x 1 week, denies any injury, states she is unsure if she is having pain due to her BS being elevated, has not been able to check BS due to not having a monitor

## 2014-11-02 NOTE — ED Notes (Signed)
Pt in xray

## 2014-11-02 NOTE — Care Management Note (Signed)
Case Management Note  Patient Details  Name: [name] MRN: [id number] Date of Birth: May 08, 1986  Subjective/Objective:       Pt. States she misplaced her glucometer. Spoke to her and suggested [facility] if she is eleigible to get a new monitor. She has stated she will do that if she does not find the old one. Pt. To be discharged home.             Action/Plan:   Expected Discharge Date:                  Expected Discharge Plan:     In-House Referral:     Discharge planning Services     Post Acute Care Choice:    Choice offered to:     DME Arranged:    DME Agency:     HH Arranged:    HH Agency:     Status of Service:     Medicare Important Message Given:    Date Medicare IM Given:    Medicare IM give by:    Date Additional Medicare IM Given:    Additional Medicare Important Message give by:     If discussed at Long Length of Stay Meetings, dates discussed:    Additional Comments:  Berna BueCheryl Azya Barbero, RN 11/02/2014, 12:47 PM

## 2014-11-02 NOTE — ED Provider Notes (Signed)
[facility] Emergency Department Provider Note   ____________________________________________  Time seen: 11:30 AM I have reviewed the triage vital signs and the triage nursing note.  HISTORY  Chief Complaint Back Pain   Historian Patient  HPI [name] is a 29 y.o. female who is complaining of moderate sharp upper back pain at the base of the neck which is been on going and waxing and waning for about one week. She has not seen her primary care doctor, she tried calling for 2 days and has not heard back from her primary care doctor. Occasionally she has pains that shoot down the sides of both shoulders up and down the back. She's not had any known trauma. 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 She does state that she's been under some stress. She's been taking for 200 mg ibuprofen tablets and 2 Tylenol tablets in the last few days for discomfort. It provides temporary relief.    Past Medical History  Diagnosis Date  . Hypertension   . Diabetes mellitus without complication     Patient Active Problem List   Diagnosis Date Noted  . Morbid obesity 05/16/2013  . Diabetes mellitus without complication poor control 09/22/2012  . HTN (hypertension) 09/22/2012    Past Surgical History  Procedure Laterality Date  . Cesarean section  04/30/2009    decreased fetal heart tones due to umbilical cord wrapped    Current Outpatient Rx  Name  Route  Sig  Dispense  Refill  . cyclobenzaprine (FLEXERIL) 10 MG tablet   Oral   Take 1 tablet (10 mg total) by mouth every 8 (eight) hours as needed for muscle spasms.   15 tablet   0   . Dapagliflozin Propanediol 5 MG TABS   Oral   Take 5 mg by mouth daily. Patient not taking: Reported on 10/31/2014   30 tablet   11   . exenatide (BYETTA) 10 MCG/0.04ML SOPN injection   Subcutaneous   Inject 0.04 mLs (10 mcg total) into the skin 2 (two) times daily with a  meal.   2.4 mL   11   . hydrochlorothiazide (HYDRODIURIL) 25 MG tablet      TAKE 1 TABLET (25 MG TOTAL) BY MOUTH DAILY.   30 tablet   5   . Insulin Glargine (LANTUS) 100 UNIT/ML Solostar Pen   Subcutaneous   Inject 55 Units into the skin 2 (two) times daily.   15 mL   11   . Insulin Pen Needle (PEN NEEDLES 3/16") 31G X 5 MM MISC   Other   1 pen by Other route as directed. Use as directed with Lantus solostar.   100 each   1   . lisinopril (PRINIVIL,ZESTRIL) 10 MG tablet      TAKE 1 TABLET BY MOUTH ONCE DAILY   30 tablet   5   . metFORMIN (GLUCOPHAGE) 500 MG tablet      Take 1 tablet by mouth 2 times daily with a meal.   60 tablet   5     Allergies Review of patient's allergies indicates no known allergies.  Family History  Problem Relation Age of Onset  . Diabetes Father   . Hypertension Father   . Hyperlipidemia Father   . Hypertension Sister   . Birth defects Sister     fluid on brain cause blindness in B eye    Social History History  Substance Use Topics  .  Smoking status: Never Smoker   . Smokeless tobacco: Never Used  . Alcohol Use: Yes     Comment: occassionally , rare    Review of Systems  Constitutional: Negative for fever. Eyes: Negative for visual changes. ENT: Negative for sore throat. Cardiovascular: Negative for chest pain. Respiratory: Negative for shortness of breath. Gastrointestinal: Negative for abdominal pain, vomiting and diarrhea. Genitourinary: Negative for dysuria. Musculoskeletal: No lower back pain. No swelling Skin: Negative for rash. Neurological: Negative for headaches, focal weakness or numbness.  ____________________________________________   PHYSICAL EXAM:  VITAL SIGNS: ED Triage Vitals  Enc Vitals Group     BP 11/02/14 0936 134/95 mmHg     Pulse Rate 11/02/14 0936 86     Resp 11/02/14 0936 18     Temp 11/02/14 0936 98.8 F (37.1 C)     Temp Source 11/02/14 0936 Oral     SpO2 11/02/14 0936 100 %      Weight 11/02/14 0936 262 lb (118.842 kg)     Height 11/02/14 0936 5\' 6"  (1.676 m)     Head Cir --      Peak Flow --      Pain Score 11/02/14 0937 9     Pain Loc --      Pain Edu? --      Excl. in GC? --      Constitutional: Alert and oriented. Well appearing and in no distress. Eyes: Conjunctivae are normal. PERRL. Normal extraocular movements. ENT   Head: Normocephalic and atraumatic.   Nose: No congestion/rhinnorhea.   Mouth/Throat: Mucous membranes are moist.   Neck: Palpable muscle spasm paraspinous musculature and trapezius on both sides. She has the most tenderness pointedly over the C7 prominence. Cardiovascular: Normal rate, regular rhythm.  No murmurs, rubs, or gallops. Respiratory: Normal respiratory effort without tachypnea nor retractions. Breath sounds are clear and equal bilaterally. No wheezes/rales/rhonchi. Gastrointestinal: Soft and nontender. Obese No distention.  Genitourinary: Musculoskeletal: Nontender with normal range of motion in all extremities. No joint effusions.  No lower extremity tenderness nor edema. Neurologic:  Normal speech and language. No gross focal neurologic deficits are appreciated. Skin:  Skin is warm, dry and intact. No rash noted. Psychiatric: Mood and affect are normal. Speech and behavior are normal. Patient exhibits appropriate insight and judgment.  ____________________________________________   EKG   ____________________________________________  LABS (pertinent positives/negatives)  White blood cell count 10.5, hemoglobin 12.1, comprehensive metabolic panel within normal limits except for glucose of 321  ____________________________________________  RADIOLOGY Radiologist results reviewed  Cervical spine x-rays: Negative for bony abnormality __________________________________________  PROCEDURES  Procedure(s) performed: Negative Critical Care performed:  Negative  ____________________________________________   ED COURSE / ASSESSMENT AND PLAN  Pertinent labs & imaging results that were available during my care of the patient were reviewed by me and considered in my medical decision making (see chart for details).  Clinically I suspect musculoskeletal pain due to muscle spasms at the base of the neck and upper shoulders. Due to the point tenderness over the C7 prominence I obtained x-rays. There is no evidence of bony abnormality.  Regarding her hyperglycemia. She is followed by primary care physician for this and is not in DKA and is asymptomatic.  _ __________________________________________   FINAL CLINICAL IMPRESSION(S) / ED DIAGNOSES   Final diagnoses:  Hyperglycemia due to type 2 diabetes mellitus  Musculoskeletal neck pain      Governor Rooksebecca Aldeen Riga, MD 11/02/14 1240

## 2014-11-02 NOTE — ED Notes (Signed)
Pt discharged home after verbalizing understanding of discharge instructions; nad noted. 

## 2014-11-02 NOTE — Discharge Instructions (Signed)
You were evaluated for upper back and base the neck pain, for which no certain cause was found, but I suspect muscle spasm and musculoskeletal pain. Return to the emergency department for any new or worsening pain, weakness, numbness, tingling, chest pain, trouble breathing, or any other symptoms concerning to you.  Musculoskeletal Pain Musculoskeletal pain is muscle and boney aches and pains. These pains can occur in any part of the body. Your caregiver may treat you without knowing the cause of the pain. They may treat you if blood or urine tests, X-rays, and other tests were normal.  CAUSES There is often not a definite cause or reason for these pains. These pains may be caused by a type of germ (virus). The discomfort may also come from overuse. Overuse includes working out too hard when your body is not fit. Boney aches also come from weather changes. Bone is sensitive to atmospheric pressure changes. HOME CARE INSTRUCTIONS   Ask when your test results will be ready. Make sure you get your test results.  Only take over-the-counter or prescription medicines for pain, discomfort, or fever as directed by your caregiver. If you were given medications for your condition, do not drive, operate machinery or power tools, or sign legal documents for 24 hours. Do not drink alcohol. Do not take sleeping pills or other medications that may interfere with treatment.  Continue all activities unless the activities cause more pain. When the pain lessens, slowly resume normal activities. Gradually increase the intensity and duration of the activities or exercise.  During periods of severe pain, bed rest may be helpful. Lay or sit in any position that is comfortable.  Putting ice on the injured area.  Put ice in a bag.  Place a towel between your skin and the bag.  Leave the ice on for 15 to 20 minutes, 3 to 4 times a day.  Follow up with your caregiver for continued problems and no reason can be found  for the pain. If the pain becomes worse or does not go away, it may be necessary to repeat tests or do additional testing. Your caregiver may need to look further for a possible cause. SEEK IMMEDIATE MEDICAL CARE IF:  You have pain that is getting worse and is not relieved by medications.  You develop chest pain that is associated with shortness or breath, sweating, feeling sick to your stomach (nauseous), or throw up (vomit).  Your pain becomes localized to the abdomen.  You develop any new symptoms that seem different or that concern you. MAKE SURE YOU:   Understand these instructions.  Will watch your condition.  Will get help right away if you are not doing well or get worse. Document Released: 06/01/2005 Document Revised: 08/24/2011 Document Reviewed: 02/03/2013 [facility] Patient Information 2015 [employer] [place]. This information is not intended to replace advice given to you by your health care provider. Make sure you discuss any questions you have with your health care provider.   High Blood Sugar High blood sugar (hyperglycemia) means that the level of sugar in your blood is higher than it should be. Signs of high blood sugar include:  Feeling thirsty.  Frequent peeing (urinating).  Feeling tired or sleepy.  Dry mouth.  Vision changes.  Feeling weak.  Feeling hungry but losing weight.  Numbness and tingling in your hands or feet.  Headache. When you ignore these signs, your blood sugar may keep going up. These problems may get worse, and other problems may begin. HOME  CARE  Check your blood sugars as told by your doctor. Write down the numbers with the date and time.  Take the right amount of insulin or diabetes pills at the right time. Write down the dose with date and time.  Refill your insulin or diabetes pills before running out.  Watch what you eat. Follow your meal plan.  Drink liquids without sugar, such as water. Check with your doctor if you have  kidney or heart disease.  Follow your doctor's orders for exercise. Exercise at the same time of day.  Keep your doctor's appointments. GET HELP RIGHT AWAY IF:   You have trouble thinking or are confused.  You have fast breathing with fruity smelling breath.  You pass out (faint).  You have 2 to 3 days of high blood sugars and you do not know why.  You have chest pain.  You are feeling sick to your stomach (nauseous) or throwing up (vomiting).  You have sudden vision changes. MAKE SURE YOU:   Understand these instructions.  Will watch your condition.  Will get help right away if you are not doing well or get worse. Document Released: 03/29/2009 Document Revised: 08/24/2011 Document Reviewed: 03/29/2009 [facility] Patient Information 2015 [place], [place]. This information is not intended to replace advice given to you by your health care provider. Make sure you discuss any questions you have with your health care provider.

## 2014-11-02 NOTE — ED Notes (Signed)
Pt states she came in today for pain in her upper back that runs from shoulder to shoulder. Pt states she did not know her sugar was high, but "that explains the cotton mouth and constant trips to the bathroom." Pt takes metformin and insulin but has not been checking her sugar. Takes 45 units of insulin bid. Pt alert & oriented with warm, dry skin.

## 2014-11-09 LAB — VITAMIN A: Vitamin A (Retinoic Acid): 27 ug/dL (ref 20–65)

## 2014-11-16 ENCOUNTER — Other Ambulatory Visit: Payer: 59

## 2014-11-16 ENCOUNTER — Telehealth: Payer: Self-pay | Admitting: Family Medicine

## 2014-11-16 DIAGNOSIS — E119 Type 2 diabetes mellitus without complications: Secondary | ICD-10-CM

## 2014-11-16 NOTE — Telephone Encounter (Signed)
-----   Message from Alvina Chouerri J Walsh sent at 11/14/2014 11:40 AM EDT ----- Regarding: Lab orders for Friday, 6.3.16 Lab orders for a 3 month f/u

## 2014-11-21 ENCOUNTER — Encounter: Payer: Self-pay | Admitting: Dietician

## 2014-11-21 ENCOUNTER — Encounter: Payer: 59 | Attending: Bariatrics | Admitting: Dietician

## 2014-11-21 VITALS — Ht 66.7 in | Wt 268.4 lb

## 2014-11-21 DIAGNOSIS — I10 Essential (primary) hypertension: Secondary | ICD-10-CM

## 2014-11-21 DIAGNOSIS — E119 Type 2 diabetes mellitus without complications: Secondary | ICD-10-CM

## 2014-11-21 NOTE — Progress Notes (Signed)
Medical Nutrition Therapy Follow-up visit:  Time: 8:00am- 9:00am Visit #:2 ASSESSMENT:  Diagnosis: obesity, Type 2 DM  Current weight:268.4    Height:66.7 in Medications: See list  Progress and evaluation: Pt. Reports that she has eliminated all sugar sweetened beverages as well as juices.  She is also practicing to have no beverages 30 minutes before or after meals. She has switched to whole wheat bread and is trying to control portions of carbohdyrate better. She states she has prepared several dinner meals at home vs. Picking up "fast food".  Physical activity: gym; 3-4 days/week for 1.5 hours   NUTRITION CARE EDUCATION: Post bariatric surgery nutrition: Instructed on post-bariatric surgery nutrition guidelines with emphasis on protein, fluid and vitamin needs. Instructed on prevention of diet related post-surgery complications. Discussed how surgery is a tool but not a solution to weight issues. Stressed need for good glucose control especially at time of surgery to promote healing and commended on her positive diet changes.  INTERVENTION:  To continue with previous goals set. Practice chewing food well and eating more slowly. Read nutrition guidelines for post surgery dietary guidelines.  EDUCATION MATERIALS GIVEN:  . Bariatric surgery guidelines and diet . Goals/ instructions   LEARNER/ who was taught:  . Patient   LEVEL OF UNDERSTANDING: . Verbalizes/ demonstrates competency  LEARNING BARRIERS: . None  WILLINGNESS TO LEARN/READINESS FOR CHANGE: . Eager, change in progress  MONITORING AND EVALUATION: dietary intake, weight, Follow-up-12/19/14 at 8:00am

## 2014-11-21 NOTE — Patient Instructions (Signed)
Practice chewing food well and eating more slowly. Read nutrition guidelines for post surgery dietary guidelines.

## 2014-11-23 ENCOUNTER — Ambulatory Visit: Payer: 59 | Admitting: Family Medicine

## 2014-11-26 ENCOUNTER — Emergency Department
Admission: EM | Admit: 2014-11-26 | Discharge: 2014-11-26 | Disposition: A | Discharge status: Home / Self Care | Payer: 59 | Attending: Emergency Medicine | Admitting: Emergency Medicine

## 2014-11-26 ENCOUNTER — Encounter: Payer: Self-pay | Admitting: Emergency Medicine

## 2014-11-26 DIAGNOSIS — E1165 Type 2 diabetes mellitus with hyperglycemia: Secondary | ICD-10-CM | POA: Insufficient documentation

## 2014-11-26 DIAGNOSIS — I10 Essential (primary) hypertension: Secondary | ICD-10-CM | POA: Insufficient documentation

## 2014-11-26 DIAGNOSIS — Z794 Long term (current) use of insulin: Secondary | ICD-10-CM | POA: Diagnosis not present

## 2014-11-26 DIAGNOSIS — R739 Hyperglycemia, unspecified: Secondary | ICD-10-CM

## 2014-11-26 DIAGNOSIS — Z79899 Other long term (current) drug therapy: Secondary | ICD-10-CM | POA: Insufficient documentation

## 2014-11-26 LAB — COMPREHENSIVE METABOLIC PANEL
ALBUMIN: 3.7 g/dL (ref 3.5–5.0)
ALT: 22 U/L (ref 14–54)
AST: 21 U/L (ref 15–41)
Alkaline Phosphatase: 98 U/L (ref 38–126)
Anion gap: 8 (ref 5–15)
BUN: 9 mg/dL (ref 6–20)
CALCIUM: 8.7 mg/dL — AB (ref 8.9–10.3)
CO2: 24 mmol/L (ref 22–32)
Chloride: 97 mmol/L — ABNORMAL LOW (ref 101–111)
Creatinine, Ser: 0.88 mg/dL (ref 0.44–1.00)
GFR calc non Af Amer: >60 mL/min (ref >60)
GLUCOSE: 667 mg/dL — AB (ref 65–99)
POTASSIUM: 3.6 mmol/L (ref 3.5–5.1)
Sodium: 129 mmol/L — ABNORMAL LOW (ref 135–145)
TOTAL PROTEIN: 7.4 g/dL (ref 6.5–8.1)
Total Bilirubin: 0.2 mg/dL — ABNORMAL LOW (ref 0.3–1.2)

## 2014-11-26 LAB — CBC WITH DIFFERENTIAL/PLATELET
BASOS ABS: 0.1 10*3/uL (ref 0–0.1)
Basophils Relative: 1 %
EOS ABS: 0.1 10*3/uL (ref 0–0.7)
HCT: 38.9 % (ref 35.0–47.0)
Hemoglobin: 11.9 g/dL — ABNORMAL LOW (ref 12.0–16.0)
LYMPHS ABS: 2.9 10*3/uL (ref 1.0–3.6)
Lymphocytes Relative: 24 %
MCH: 23.5 pg — AB (ref 26.0–34.0)
MCHC: 30.7 g/dL — ABNORMAL LOW (ref 32.0–36.0)
MCV: 76.4 fL — AB (ref 80.0–100.0)
Monocytes Absolute: 0.9 10*3/uL (ref 0.2–0.9)
Monocytes Relative: 7 %
Neutro Abs: 8.3 10*3/uL — ABNORMAL HIGH (ref 1.4–6.5)
Neutrophils Relative %: 67 %
Platelets: 302 10*3/uL (ref 150–440)
RBC: 5.09 MIL/uL (ref 3.80–5.20)
RDW: 15.4 % — AB (ref 11.5–14.5)
WBC: 12.2 10*3/uL — ABNORMAL HIGH (ref 3.6–11.0)

## 2014-11-26 LAB — GLUCOSE, CAPILLARY
GLUCOSE-CAPILLARY: 402 mg/dL — AB (ref 65–99)
Glucose-Capillary: 447 mg/dL — ABNORMAL HIGH (ref 65–99)

## 2014-11-26 LAB — URINALYSIS COMPLETE WITH MICROSCOPIC (ARMC ONLY)
Bacteria, UA: NONE SEEN
Bilirubin Urine: NEGATIVE
Glucose, UA: >500 mg/dL — AB
Hgb urine dipstick: NEGATIVE
KETONES UR: NEGATIVE mg/dL
NITRITE: NEGATIVE
PROTEIN: NEGATIVE mg/dL
Specific Gravity, Urine: 1.026 (ref 1.005–1.030)
pH: 6 (ref 5.0–8.0)

## 2014-11-26 LAB — POCT PREGNANCY, URINE: Preg Test, Ur: NEGATIVE

## 2014-11-26 MED ORDER — SODIUM CHLORIDE 0.9 % IV SOLN
Freq: Once | INTRAVENOUS | Status: AC
  Administered 2014-11-26: 18:00:00 via INTRAVENOUS

## 2014-11-26 MED ORDER — SODIUM CHLORIDE 0.9 % IV SOLN
Freq: Once | INTRAVENOUS | Status: AC
  Administered 2014-11-26: 21:00:00 via INTRAVENOUS

## 2014-11-26 NOTE — ED Notes (Signed)
Second Liter of NS infusing.  Discharge held until infusion complete.

## 2014-11-26 NOTE — ED Notes (Signed)
Describes blurry vision, increased thirst, and frequent urination.  Onset of symptoms this weekend.  Diagnosed with Diabetes in the past.

## 2014-11-26 NOTE — ED Notes (Signed)
AAOx3.  Skin warm and dry.  NAD 

## 2014-11-26 NOTE — ED Notes (Signed)
AAOx3.  Skin warm and dry.  No SOB/DOE.  Verbalizes understanding of discharge instructions.  Has follow up appointment scheduled with PCP thursday 11/29/14

## 2014-11-26 NOTE — ED Provider Notes (Signed)
[facility] Emergency Department Provider Note  ____________________________________________  Time seen: Approximately 7:58 PM  I have reviewed the triage vital signs and the nursing notes.   HISTORY  Chief Complaint Hyperglycemia    HPI [name] is a 29 y.o. female with a history of obesity and diabetes on both oral medication and Lantus who presents with several days of increased thirst and frequent urination.  She has also had some blurred vision at times.She states that this is the way that she feels when her sugar is running high.  Her glucometer was unable to register her level today.  She denies nausea, vomiting, abdominal pain, chest pain, shortness of breath.  She also denies dysuria.  She does endorse polyuria and polydipsia.   Past Medical History  Diagnosis Date  . Hypertension   . Diabetes mellitus without complication     Patient Active Problem List   Diagnosis Date Noted  . Morbid obesity 05/16/2013  . Diabetes mellitus without complication poor control 09/22/2012  . HTN (hypertension) 09/22/2012    Past Surgical History  Procedure Laterality Date  . Cesarean section  04/30/2009    decreased fetal heart tones due to umbilical cord wrapped  . Cholecystectomy    . Cesarean section      Current Outpatient Rx  Name  Route  Sig  Dispense  Refill  . cyclobenzaprine (FLEXERIL) 10 MG tablet   Oral   Take 1 tablet (10 mg total) by mouth every 8 (eight) hours as needed for muscle spasms. Patient not taking: Reported on 11/21/2014   15 tablet   0   . Dapagliflozin Propanediol 5 MG TABS   Oral   Take 5 mg by mouth daily. Patient not taking: Reported on 10/31/2014   30 tablet   11   . exenatide (BYETTA) 10 MCG/0.04ML SOPN injection   Subcutaneous   Inject 0.04 mLs (10 mcg total) into the skin 2 (two) times daily with a meal.   2.4 mL   11   . hydrochlorothiazide (HYDRODIURIL) 25 MG tablet      TAKE 1 TABLET (25 MG TOTAL) BY  MOUTH DAILY.   30 tablet   5   . Insulin Glargine (LANTUS) 100 UNIT/ML Solostar Pen   Subcutaneous   Inject 55 Units into the skin 2 (two) times daily.   15 mL   11   . Insulin Pen Needle (PEN NEEDLES 3/16") 31G X 5 MM MISC   Other   1 pen by Other route as directed. Use as directed with Lantus solostar.   100 each   1   . lisinopril (PRINIVIL,ZESTRIL) 10 MG tablet      TAKE 1 TABLET BY MOUTH ONCE DAILY   30 tablet   5   . metFORMIN (GLUCOPHAGE) 500 MG tablet      Take 1 tablet by mouth 2 times daily with a meal.   60 tablet   5     Allergies Review of patient's allergies indicates no known allergies.  Family History  Problem Relation Age of Onset  . Diabetes Father   . Hypertension Father   . Hyperlipidemia Father   . Hypertension Sister   . Birth defects Sister     fluid on brain cause blindness in B eye    Social History History  Substance Use Topics  . Smoking status: Never Smoker   . Smokeless tobacco: Never Used  . Alcohol Use: No     Comment: occassionally , rare  Review of Systems Constitutional: No fever/chills Eyes: Occasional blurred vision ENT: No sore throat. Cardiovascular: Denies chest pain. Respiratory: Denies shortness of breath. Gastrointestinal: No abdominal pain.  No nausea, no vomiting.  No diarrhea.  No constipation. Genitourinary: Negative for dysuria.  Increased urination Musculoskeletal: Negative for back pain. Skin: Negative for rash. Neurological: Negative for headaches, focal weakness or numbness.  10-point ROS otherwise negative.  ____________________________________________   PHYSICAL EXAM:  VITAL SIGNS: ED Triage Vitals  Enc Vitals Group     BP 11/26/14 1727 153/102 mmHg     Pulse Rate 11/26/14 1727 93     Resp 11/26/14 1727 20     Temp 11/26/14 1727 98.2 F (36.8 C)     Temp Source 11/26/14 1727 Oral     SpO2 11/26/14 1727 100 %     Weight 11/26/14 1727 242 lb (109.77 kg)     Height 11/26/14 1727 5\' 6"   (1.676 m)     Head Cir --      Peak Flow --      Pain Score 11/26/14 1728 0     Pain Loc --      Pain Edu? --      Excl. in GC? --     Constitutional: Alert and oriented. Well appearing and in no acute distress. Eyes: Conjunctivae are normal. PERRL. EOMI. Head: Atraumatic. Nose: No congestion/rhinnorhea. Mouth/Throat: Mucous membranes are moist.  Oropharynx non-erythematous. Neck: No stridor.   Cardiovascular: Normal rate, regular rhythm. Grossly normal heart sounds.  Good peripheral circulation. Respiratory: Normal respiratory effort.  No retractions. Lungs CTAB. Gastrointestinal: Morbid obesity.  Soft and nontender. No distention. No abdominal bruits. No CVA tenderness. Musculoskeletal: No lower extremity tenderness nor edema.  No joint effusions. Neurologic:  Normal speech and language. No gross focal neurologic deficits are appreciated. Speech is normal. No gait instability. Skin:  Skin is warm, dry and intact. No rash noted. Psychiatric: Mood and affect are normal. Speech and behavior are normal.  ____________________________________________   LABS (all labs ordered are listed, but only abnormal results are displayed)  Labs Reviewed  CBC WITH DIFFERENTIAL/PLATELET - Abnormal; Notable for the following:    WBC 12.2 (*)    Hemoglobin 11.9 (*)    MCV 76.4 (*)    MCH 23.5 (*)    MCHC 30.7 (*)    RDW 15.4 (*)    Neutro Abs 8.3 (*)    All other components within normal limits  COMPREHENSIVE METABOLIC PANEL - Abnormal; Notable for the following:    Sodium 129 (*)    Chloride 97 (*)    Glucose, Bld 667 (*)    Calcium 8.7 (*)    Total Bilirubin 0.2 (*)    All other components within normal limits  URINALYSIS COMPLETEWITH MICROSCOPIC ([facility] ONLY) - Abnormal; Notable for the following:    Color, Urine COLORLESS (*)    APPearance CLEAR (*)    Glucose, UA >500 (*)    Leukocytes, UA TRACE (*)    Squamous Epithelial / LPF 0-5 (*)    All other components within normal limits   GLUCOSE, CAPILLARY - Abnormal; Notable for the following:    Glucose-Capillary 447 (*)    All other components within normal limits  GLUCOSE, CAPILLARY - Abnormal; Notable for the following:    Glucose-Capillary >600 (*)    All other components within normal limits  HEMOGLOBIN A1C  POC URINE PREG, ED  POCT PREGNANCY, URINE   ____________________________________________  EKG  Not indicated ____________________________________________  RADIOLOGY  Not indicated  ____________________________________________   INITIAL IMPRESSION / ASSESSMENT AND PLAN / ED COURSE  Pertinent labs & imaging results that were available during my care of the patient were reviewed by me and considered in my medical decision making (see chart for details).  The patient's blood sugar initially was 667 on her chemistry but she has a normal anion gap.  The rest of her labs are unremarkable.  After 1 L of fluid her fingerstick blood sugar has dropped to 447.  I provided another liter of fluid and discussed with the patient extensively the need for follow-up with her outpatient provider to discuss her diabetes management.  I recommended that she return to taking her metformin twice a day instead of just once a day.  I gave her my usual and customary return precautions.  There is no evidence that she is suffering from DKA nor HHNS.  The patient is laughing and joking with me and is in no acute distress.  ____________________________________________  FINAL CLINICAL IMPRESSION(S) / ED DIAGNOSES  Final diagnoses:  Acute hyperglycemia      NEW MEDICATIONS STARTED DURING THIS VISIT:  New Prescriptions   No medications on file     Loleta Rose, MD 11/26/14 2226

## 2014-11-26 NOTE — ED Notes (Signed)
Nausea and vomiting since yesterday, states FSBS running HI today

## 2014-11-26 NOTE — Discharge Instructions (Signed)
As we discussed, though your blood sugar is running high, it is not dangerous at this time.  Making adjustments in the Emergency Department (ED) and possibly causing your glucose level to drop too low is more dangerous than continuing your current medications at this time until you can follow up with your clinic doctor.  For your records, your initial blood sugar was 667 but it dropped into the 400s after 1 L of fluid and improved even more after the second liter.  Please continue your medications and follow up with your regular doctor as recommended in these documents.  If you develop new or worsening symptoms that concern you, please return to the Emergency Department.   Hyperglycemia Hyperglycemia occurs when the glucose (sugar) in your blood is too high. Hyperglycemia can happen for many reasons, but it most often happens to people who do not know they have diabetes or are not managing their diabetes properly.  CAUSES  Whether you have diabetes or not, there are other causes of hyperglycemia. Hyperglycemia can occur when you have diabetes, but it can also occur in other situations that you might not be as aware of, such as: Diabetes  If you have diabetes and are having problems controlling your blood glucose, hyperglycemia could occur because of some of the following reasons:  Not following your meal plan.  Not taking your diabetes medications or not taking it properly.  Exercising less or doing less activity than you normally do.  Being sick. Pre-diabetes  This cannot be ignored. Before people develop Type 2 diabetes, they almost always have "pre-diabetes." This is when your blood glucose levels are higher than normal, but not yet high enough to be diagnosed as diabetes. Research has shown that some long-term damage to the body, especially the heart and circulatory system, may already be occurring during pre-diabetes. If you take action to manage your blood glucose when you have  pre-diabetes, you may delay or prevent Type 2 diabetes from developing. Stress  If you have diabetes, you may be "diet" controlled or on oral medications or insulin to control your diabetes. However, you may find that your blood glucose is higher than usual in the hospital whether you have diabetes or not. This is often referred to as "stress hyperglycemia." Stress can elevate your blood glucose. This happens because of hormones put out by the body during times of stress. If stress has been the cause of your high blood glucose, it can be followed regularly by your caregiver. That way he/she can make sure your hyperglycemia does not continue to get worse or progress to diabetes. Steroids  Steroids are medications that act on the infection fighting system (immune system) to block inflammation or infection. One side effect can be a rise in blood glucose. Most people can produce enough extra insulin to allow for this rise, but for those who cannot, steroids make blood glucose levels go even higher. It is not unusual for steroid treatments to "uncover" diabetes that is developing. It is not always possible to determine if the hyperglycemia will go away after the steroids are stopped. A special blood test called an A1c is sometimes done to determine if your blood glucose was elevated before the steroids were started. SYMPTOMS  Thirsty.  Frequent urination.  Dry mouth.  Blurred vision.  Tired or fatigue.  Weakness.  Sleepy.  Tingling in feet or leg. DIAGNOSIS  Diagnosis is made by monitoring blood glucose in one or all of the following ways:  A1c test.  This is a chemical found in your blood.  Fingerstick blood glucose monitoring.  Laboratory results. TREATMENT  First, knowing the cause of the hyperglycemia is important before the hyperglycemia can be treated. Treatment may include, but is not be limited to:  Education.  Change or adjustment in medications.  Change or adjustment in  meal plan.  Treatment for an illness, infection, etc.  More frequent blood glucose monitoring.  Change in exercise plan.  Decreasing or stopping steroids.  Lifestyle changes. HOME CARE INSTRUCTIONS   Test your blood glucose as directed.  Exercise regularly. Your caregiver will give you instructions about exercise. Pre-diabetes or diabetes which comes on with stress is helped by exercising.  Eat wholesome, balanced meals. Eat often and at regular, fixed times. Your caregiver or nutritionist will give you a meal plan to guide your sugar intake.  Being at an ideal weight is important. If needed, losing as little as 10 to 15 pounds may help improve blood glucose levels. SEEK MEDICAL CARE IF:   You have questions about medicine, activity, or diet.  You continue to have symptoms (problems such as increased thirst, urination, or weight gain). SEEK IMMEDIATE MEDICAL CARE IF:   You are vomiting or have diarrhea.  Your breath smells fruity.  You are breathing faster or slower.  You are very sleepy or incoherent.  You have numbness, tingling, or pain in your feet or hands.  You have chest pain.  Your symptoms get worse even though you have been following your caregiver's orders.  If you have any other questions or concerns. Document Released: 11/25/2000 Document Revised: 08/24/2011 Document Reviewed: 09/28/2011 [facility] Patient Information 2015 [place], [place]. This information is not intended to replace advice given to you by your health care provider. Make sure you discuss any questions you have with your health care provider.

## 2014-11-27 LAB — HEMOGLOBIN A1C: HEMOGLOBIN A1C: 14.3 % — AB (ref 4.0–6.0)

## 2014-11-29 ENCOUNTER — Ambulatory Visit: Payer: 59 | Admitting: Family Medicine

## 2014-12-05 ENCOUNTER — Other Ambulatory Visit: Payer: Self-pay | Admitting: Family Medicine

## 2014-12-07 ENCOUNTER — Ambulatory Visit: Payer: 59 | Admitting: Family Medicine

## 2014-12-07 ENCOUNTER — Other Ambulatory Visit: Payer: Self-pay | Admitting: Family Medicine

## 2014-12-19 ENCOUNTER — Encounter: Payer: 59 | Attending: Bariatrics | Admitting: Dietician

## 2014-12-19 ENCOUNTER — Encounter: Payer: Self-pay | Admitting: Dietician

## 2014-12-19 NOTE — Patient Instructions (Signed)
Pt. to try some of the protein supplements with higher amounts of protein vs. Ensure or Glucerna which she has tried. To read the dietary guidelines for post surgery that were reviewed today. To continue practicing eating more slowly and chewing food well. To be prepared with vitamin supplements needed for 4 weeks post surgery.

## 2014-12-19 NOTE — Progress Notes (Signed)
Medical Nutrition Therapy Follow-up visit:  Time: 8:10-8:40am Visit #:3 ASSESSMENT:  Diagnosis:obesity; 3rd pre-bariatric surgery nutrition evaluation/education  Current weight:266.4    Height:66.7 in Medications: See list Medical History: Type 2 Diabetes Progress and evaluation: Pt. plans to have "SIPS" bariatric surgery in near future. She has continued with positive diet changes of eliminating sugar sweetened beverages, drinking more water and preparing more meals at home vs. picking up "fast food" choices. She has lost 2 lbs since previous visit and reports her fasting blood sugars are in 110's-120's.  Physical activity: gym; 3-4 days/week for 1.5 hours   NUTRITION CARE EDUCATION: Post-bariatric surgery: Reviewed post-bariatric surgery diet/nutrition guidelines. Pt. was able to state number of fluid ounces needed but could not remember grams of protein needed daily post-surgery. Reviewed protein guidelines and types of protein supplements and amounts to meet protein needs. Also, reviewed vitamin supplementation needs post surgery. Discussed waiting until week 3 post surgery to introduce semi-solid foods and reviewed choices. Instructed on prevention of diet related complications especially dehydration. Discussed importance of resuming exercise when MD allows post surgery.  INTERVENTION:  Pt. to try some of the protein supplements with higher amounts of protein vs. Ensure or Glucerna which she has tried. To read the dietary guidelines for post surgery that were reviewed today. To continue practicing eating more slowly and chewing food well. To be prepared with vitamin supplements needed for 4 weeks post surgery.   EDUCATION MATERIALS GIVEN:  . Bariatric surgery guidelines and diet (Patient has a copy)  LEARNER/ who was taught:  . Patient   LEVEL OF UNDERSTANDING: . Verbalizes/ demonstrates competency LEARNING BARRIERS: . None  WILLINGNESS TO LEARN/READINESS FOR  CHANGE: . Eager, change in progress  MONITORING AND EVALUATION:  To schedule at 3 weeks post surgery  From a nutrition standpoint, patient is ready to continue bariatric surgery process. She expresses an understanding of the instruction given and has been willing to make positive diet and exercise changes prior to surgery.

## 2014-12-21 ENCOUNTER — Other Ambulatory Visit: Payer: 59

## 2014-12-25 ENCOUNTER — Ambulatory Visit
Admission: RE | Admit: 2014-12-25 | Discharge: 2014-12-25 | Disposition: A | Discharge status: Home / Self Care | Payer: 59 | Source: Ambulatory Visit | Attending: Bariatrics | Admitting: Bariatrics

## 2014-12-25 ENCOUNTER — Ambulatory Visit
Admission: RE | Admit: 2014-12-25 | Discharge: 2014-12-25 | Disposition: A | Discharge status: Home / Self Care | Payer: 59 | Source: Ambulatory Visit | Attending: *Deleted | Admitting: *Deleted

## 2014-12-25 ENCOUNTER — Other Ambulatory Visit: Payer: Self-pay

## 2014-12-25 ENCOUNTER — Other Ambulatory Visit: Payer: Self-pay | Admitting: Bariatrics

## 2014-12-25 DIAGNOSIS — Z01810 Encounter for preprocedural cardiovascular examination: Secondary | ICD-10-CM | POA: Diagnosis not present

## 2014-12-25 DIAGNOSIS — E108 Type 1 diabetes mellitus with unspecified complications: Secondary | ICD-10-CM

## 2014-12-25 DIAGNOSIS — Z01818 Encounter for other preprocedural examination: Secondary | ICD-10-CM | POA: Insufficient documentation

## 2014-12-27 ENCOUNTER — Ambulatory Visit: Payer: 59 | Admitting: Family Medicine

## 2014-12-28 ENCOUNTER — Ambulatory Visit
Admission: RE | Admit: 2014-12-28 | Discharge: 2014-12-28 | Disposition: A | Discharge status: Home / Self Care | Payer: 59 | Source: Ambulatory Visit | Attending: Bariatrics | Admitting: Bariatrics

## 2014-12-28 DIAGNOSIS — E119 Type 2 diabetes mellitus without complications: Secondary | ICD-10-CM | POA: Insufficient documentation

## 2014-12-31 ENCOUNTER — Encounter: Payer: Self-pay | Admitting: Dietician

## 2015-01-03 ENCOUNTER — Telehealth: Payer: Self-pay | Admitting: Dietician

## 2015-01-07 ENCOUNTER — Other Ambulatory Visit: Payer: Self-pay | Admitting: Family Medicine

## 2015-01-28 ENCOUNTER — Other Ambulatory Visit: Payer: Self-pay | Admitting: Family Medicine

## 2015-01-29 ENCOUNTER — Encounter: Payer: Self-pay | Admitting: Family Medicine

## 2015-01-29 ENCOUNTER — Ambulatory Visit (INDEPENDENT_AMBULATORY_CARE_PROVIDER_SITE_OTHER): Payer: 59 | Admitting: Family Medicine

## 2015-01-29 VITALS — BP 130/92 | HR 91 | Temp 98.2°F | Ht 66.75 in | Wt 270.0 lb

## 2015-01-29 DIAGNOSIS — E119 Type 2 diabetes mellitus without complications: Secondary | ICD-10-CM

## 2015-01-29 DIAGNOSIS — I10 Essential (primary) hypertension: Secondary | ICD-10-CM

## 2015-01-29 MED ORDER — EXENATIDE 10 MCG/0.04ML ~~LOC~~ SOPN: 10.0000 ug | PEN_INJECTOR | Freq: Two times a day (BID) | SUBCUTANEOUS | Status: DC

## 2015-01-29 MED ORDER — INSULIN GLARGINE 100 UNIT/ML SOLOSTAR PEN: PEN_INJECTOR | SUBCUTANEOUS | Status: DC

## 2015-01-29 NOTE — Assessment & Plan Note (Signed)
Very poorly controlled, brittle diabetes. Difficult to control with lantus, metformin. Pt not taking med regularly.  She has upcoming bariatric surgery. Discussed this in detail with pt. Recommended as much weight loss and as tight control of DM as possible prior to the surgery. Will restart lantus and byetta.  Follow upA1C after surgery.

## 2015-01-29 NOTE — Patient Instructions (Addendum)
Keep on working on healthy eating and exercise.  Restart lantus and byetta, continue metformin . Try to obtain as much weight loss and as tight glucose control prior to the surgery. Call with any questions or if you need follow up after the surgery!

## 2015-01-29 NOTE — Progress Notes (Signed)
29 year old female presents for follow up DM.  She has been to ER 2 times in last 3 months for hyperglycemia.  Glucose in 400s. At the time she was not taking her meds. She states that she gets busy and forgets a lot. Also her numbers got down to 110-117.Marland Kitchen So she thought she could stop meds for a while.   Diabetes: Due for re-eval.   Lab Results  Component Value Date   HGBA1C 14.3* 11/26/2014  On  Lantus insulin to 45 Unitstwice daily.. Continued low dose metformin.  She occ does not take given SE.  She stopped the byetta .Marland Kitchen Given she is looking into getting bariatric surgery next month. In past : She tried farxiga but along with lisinopril it caused headache.  Was on metformin 1000 mg twice daily has some diarrhea with this.  Was on victoza .Marland Kitchen Stopped due to N/V.    Recent Labs    Lab Results  Component Value Date   HGBA1C 13.5* 04/11/2014    Hypoglycemic episodes: occ 72 Hyperglycemic episodes: frequent  Feet problems: no ulcers just cracked skin.  Blood Sugars averaging: FBS 200-220 she has ben out of lantus in last week. eye exam within last year: uptodate   She feels likely she has been doing well healthy low carb diet. No exercise. Walking at work. She has gained 30 lbs back in last 3 months!!! Off byetta. Wt Readings from Last 3 Encounters:  01/29/15 270 lb (122.471 kg)  12/19/14 266 lb 6.4 oz (120.838 kg)  11/26/14 242 lb (109.77 kg)    Hypertension: Well controlled on lisinopril, HCTZ. No headaches.  BP Readings from Last 3 Encounters:  01/29/15 130/92  11/26/14 126/78  11/02/14 129/86  Chest pain with exertion:None  Edema:none  Short of breath:none  Average home BPs:not checking  Other issues:  Exercise: She is exercising at gym 4 days a week.   Body mass index is 42.63 kg/(m^2).   Review of Systems  Constitutional: Negative for fever and fatigue.  HENT: Negative for ear pain.  Eyes: Negative for pain.  Respiratory:  Negative for chest tightness and shortness of breath.  Cardiovascular: Negative for chest pain, palpitations and leg swelling.  Gastrointestinal: Negative for abdominal pain.  Genitourinary:Negative for dysuria.  Neurological: Negative for headaches.  Objective:   Physical Exam  Constitutional: Vital signs are normal. She appears well-developed and well-nourished. She is cooperative. Non-toxic appearance. She does not appear ill. No distress.  Non toxic appearing morbidly obese.  HENT:  Head: Normocephalic.  Right Ear: Hearing, tympanic membrane, external ear and ear canal normal. Tympanic membrane is not erythematous, not retracted and not bulging.  Left Ear: Hearing, tympanic membrane, external ear and ear canal normal. Tympanic membrane is not erythematous, not retracted and not bulging.  Nose: No mucosal edema or rhinorrhea. Right sinus exhibits no maxillary sinus tenderness and no frontal sinus tenderness. Left sinus exhibits no maxillary sinus tenderness and no frontal sinus tenderness.  Mouth/Throat: Uvula is midline, oropharynx is clear and moist and mucous membranes are normal.  Eyes: Conjunctivae, EOM and lids are normal. Pupils are equal, round, and reactive to light. Lids are everted and swept, no foreign bodies found.  Neck: Trachea normal and normal range of motion. Neck supple. Carotid bruit is not present. No mass and no thyromegaly present.  Cardiovascular: Normal rate, regular rhythm, S1 normal, S2 normal, normal heart sounds, intact distal pulses and normal pulses. Exam reveals no gallop and no friction rub.  No murmur  heard.  Pulmonary/Chest: Effort normal and breath sounds normal. Not tachypneic. No respiratory distress. She has no decreased breath sounds. She has no wheezes. She has no rhonchi. She has no rales.  Abdominal: Soft. Normal appearance and bowel sounds are normal. There is no tenderness.  Neurological: She is alert.  Skin: Skin is warm, dry  and intact. No rash noted. Multiple small boils in groin, minimal fluctuance. Psychiatric: Her speech is normal and behavior is normal. Judgment and thought content normal. Her mood appears not anxious. Cognition and memory are normal. She does not exhibit a depressed mood.    Foot exam: dry cracking skin on B heels, right > left. nml monofilament.

## 2015-01-29 NOTE — Assessment & Plan Note (Signed)
Well controlled. Continue current medication.  

## 2015-01-29 NOTE — Assessment & Plan Note (Signed)
She has upcoming bariatric surgery on 02/20/15. Dicsused the life change accompanied with this surgery in deatil with pt.

## 2015-01-29 NOTE — Progress Notes (Signed)
Pre visit review using our clinic review tool, if applicable. No additional management support is needed unless otherwise documented below in the visit note. 

## 2015-02-01 NOTE — Telephone Encounter (Signed)
Patient called and requested that her nutrition assessments from May, June and July, 2016 be faxed to the Bariatric Surgeon's office in [place] and I followed through to do this.

## 2015-02-04 LAB — HM DIABETES EYE EXAM

## 2015-03-07 ENCOUNTER — Other Ambulatory Visit: Payer: 59

## 2015-03-13 ENCOUNTER — Encounter
Admission: RE | Admit: 2015-03-13 | Discharge: 2015-03-13 | Disposition: A | Discharge status: Home / Self Care | Payer: 59 | Source: Ambulatory Visit | Attending: Bariatrics | Admitting: Bariatrics

## 2015-03-13 DIAGNOSIS — Z01818 Encounter for other preprocedural examination: Secondary | ICD-10-CM | POA: Insufficient documentation

## 2015-03-13 HISTORY — DX: Headache, unspecified: R51.9

## 2015-03-13 HISTORY — DX: Headache: R51

## 2015-03-13 LAB — ABO/RH: ABO/RH(D): O POS

## 2015-03-13 LAB — BASIC METABOLIC PANEL
Anion gap: 7 (ref 5–15)
BUN: 8 mg/dL (ref 6–20)
CHLORIDE: 106 mmol/L (ref 101–111)
CO2: 24 mmol/L (ref 22–32)
CREATININE: 0.45 mg/dL (ref 0.44–1.00)
Calcium: 9.1 mg/dL (ref 8.9–10.3)
GFR calc non Af Amer: >60 mL/min (ref >60)
Glucose, Bld: 136 mg/dL — ABNORMAL HIGH (ref 65–99)
POTASSIUM: 3.9 mmol/L (ref 3.5–5.1)
SODIUM: 137 mmol/L (ref 135–145)

## 2015-03-13 LAB — TYPE AND SCREEN
ABO/RH(D): O POS
Antibody Screen: NEGATIVE

## 2015-03-13 NOTE — Patient Instructions (Signed)
  Your procedure is scheduled on: March 19, 2015 (Tuesday) Report to Day Surgery. To find out your arrival time please call [phone] between 1PM - 3PM on March 18, 2015 (Monday).  Remember: Instructions that are not followed completely may result in serious medical risk, up to and including death, or upon the discretion of your surgeon and anesthesiologist your surgery may need to be rescheduled.    __x__ 1. Do not eat food or drink liquids after midnight. No gum chewing or hard candies.     ____ 2. No Alcohol for 24 hours before or after surgery.   ____ 3. Bring all medications with you on the day of surgery if instructed.    __x__ 4. Notify your doctor if there is any change in your medical condition     (cold, fever, infections).     Do not wear jewelry, make-up, hairpins, clips or nail polish.  Do not wear lotions, powders, or perfumes. You may wear deodorant.  Do not shave 48 hours prior to surgery. Men may shave face and neck.  Do not bring valuables to the hospital.    [facility] is not responsible for any belongings or valuables.               Contacts, dentures or bridgework may not be worn into surgery.  Leave your suitcase in the car. After surgery it may be brought to your room.  For patients admitted to the hospital, discharge time is determined by your                treatment team.   Patients discharged the day of surgery will not be allowed to drive home.   Please read over the following fact sheets that you were given:   Surgical Site Infection Prevention   ____ Take these medicines the morning of surgery with A SIP OF WATER:    1. Lisinopril  2.   3.   4.  5.  6.  ____ Fleet Enema (as directed)   _x__ Use CHG Soap as directed  ____ Use inhalers on the day of surgery  _x___ Stop metformin 2 days prior to surgery (STOP METFORMIN ON October 2. 2016)    __x__ Take 1/2 of usual insulin dose the night before surgery and none on the morning of  surgery. (NO INSULIN THE MORNING OF SURGERY)  ____ Stop Coumadin/Plavix/aspirin on   __x__ Stop Anti-inflammatories on  (NO MORE EXCEDRIN, TYLENOL OK TO TAKE FOR PAIN)    ____ Stop supplements until after surgery.    ____ Bring C-Pap to the hospital.

## 2015-03-19 ENCOUNTER — Encounter
Admission: RE | Disposition: A | Discharge status: Home / Self Care | Payer: Self-pay | Source: Ambulatory Visit | Attending: Bariatrics

## 2015-03-19 ENCOUNTER — Inpatient Hospital Stay: Payer: 59 | Admitting: Anesthesiology

## 2015-03-19 ENCOUNTER — Encounter: Payer: Self-pay | Admitting: *Deleted

## 2015-03-19 ENCOUNTER — Inpatient Hospital Stay
Admission: RE | Admit: 2015-03-19 | Discharge: 2015-03-20 | DRG: 621 | Disposition: A | Discharge status: Home / Self Care | Payer: 59 | Source: Ambulatory Visit | Attending: Bariatrics | Admitting: Bariatrics

## 2015-03-19 DIAGNOSIS — E119 Type 2 diabetes mellitus without complications: Secondary | ICD-10-CM | POA: Diagnosis present

## 2015-03-19 DIAGNOSIS — Z6841 Body Mass Index (BMI) 40.0 and over, adult: Secondary | ICD-10-CM | POA: Diagnosis not present

## 2015-03-19 DIAGNOSIS — Z794 Long term (current) use of insulin: Secondary | ICD-10-CM | POA: Diagnosis not present

## 2015-03-19 DIAGNOSIS — Z91018 Allergy to other foods: Secondary | ICD-10-CM

## 2015-03-19 DIAGNOSIS — I10 Essential (primary) hypertension: Secondary | ICD-10-CM | POA: Diagnosis present

## 2015-03-19 DIAGNOSIS — Z79899 Other long term (current) drug therapy: Secondary | ICD-10-CM

## 2015-03-19 DIAGNOSIS — Z9884 Bariatric surgery status: Secondary | ICD-10-CM

## 2015-03-19 HISTORY — PX: LAPAROSCOPIC GASTRIC SLEEVE RESECTION: SHX5895

## 2015-03-19 LAB — CREATININE, SERUM
Creatinine, Ser: 0.56 mg/dL (ref 0.44–1.00)
GFR calc Af Amer: >60 mL/min (ref >60)
GFR calc non Af Amer: >60 mL/min (ref >60)

## 2015-03-19 LAB — CBC
HEMATOCRIT: 40.1 % (ref 35.0–47.0)
Hemoglobin: 13.2 g/dL (ref 12.0–16.0)
MCH: 24.9 pg — AB (ref 26.0–34.0)
MCHC: 33 g/dL (ref 32.0–36.0)
MCV: 75.6 fL — AB (ref 80.0–100.0)
PLATELETS: 307 10*3/uL (ref 150–440)
RBC: 5.31 MIL/uL — ABNORMAL HIGH (ref 3.80–5.20)
RDW: 15 % — AB (ref 11.5–14.5)
WBC: 17 10*3/uL — ABNORMAL HIGH (ref 3.6–11.0)

## 2015-03-19 LAB — POCT PREGNANCY, URINE: Preg Test, Ur: NEGATIVE

## 2015-03-19 LAB — GLUCOSE, CAPILLARY
Glucose-Capillary: 86 mg/dL (ref 65–99)
Glucose-Capillary: 134 mg/dL — ABNORMAL HIGH (ref 65–99)

## 2015-03-19 SURGERY — GASTRECTOMY, SLEEVE, LAPAROSCOPIC
Anesthesia: General

## 2015-03-19 MED ORDER — DEXAMETHASONE SODIUM PHOSPHATE 4 MG/ML IJ SOLN
INTRAMUSCULAR | Status: DC | PRN
  Administered 2015-03-19: 8 mg via INTRAVENOUS

## 2015-03-19 MED ORDER — FENTANYL CITRATE (PF) 100 MCG/2ML IJ SOLN
INTRAMUSCULAR | Status: AC
  Administered 2015-03-19: 50 ug via INTRAVENOUS
  Filled 2015-03-19: qty 2

## 2015-03-19 MED ORDER — ROCURONIUM BROMIDE 100 MG/10ML IV SOLN
INTRAVENOUS | Status: DC | PRN
  Administered 2015-03-19: 50 mg via INTRAVENOUS
  Administered 2015-03-19: 20 mg via INTRAVENOUS

## 2015-03-19 MED ORDER — SODIUM CHLORIDE 0.9 % IV SOLN
INTRAVENOUS | Status: DC
Start: 2015-03-19 — End: 2015-03-19
  Administered 2015-03-19 (×2): via INTRAVENOUS

## 2015-03-19 MED ORDER — ACETAMINOPHEN 10 MG/ML IV SOLN
INTRAVENOUS | Status: DC | PRN
  Administered 2015-03-19: 1000 mg via INTRAVENOUS

## 2015-03-19 MED ORDER — CEFAZOLIN SODIUM-DEXTROSE 2-3 GM-% IV SOLR
2.0000 g | Freq: Once | INTRAVENOUS | Status: AC
  Administered 2015-03-19: 2 g via INTRAVENOUS

## 2015-03-19 MED ORDER — SCOPOLAMINE 1 MG/3DAYS TD PT72
1.0000 | MEDICATED_PATCH | TRANSDERMAL | Status: DC
  Administered 2015-03-19: 1.5 mg via TRANSDERMAL

## 2015-03-19 MED ORDER — PHENYLEPHRINE HCL 10 MG/ML IJ SOLN
INTRAMUSCULAR | Status: DC | PRN
  Administered 2015-03-19: 100 ug via INTRAVENOUS

## 2015-03-19 MED ORDER — SCOPOLAMINE 1 MG/3DAYS TD PT72
MEDICATED_PATCH | TRANSDERMAL | Status: AC
  Administered 2015-03-19: 1.5 mg via TRANSDERMAL
  Filled 2015-03-19: qty 1

## 2015-03-19 MED ORDER — HYDROCODONE-ACETAMINOPHEN 7.5-325 MG/15ML PO SOLN
5.0000 mL | ORAL | Status: DC | PRN
  Administered 2015-03-20: 10 mL via ORAL
  Filled 2015-03-19: qty 15

## 2015-03-19 MED ORDER — ONDANSETRON HCL 4 MG/2ML IJ SOLN: 4.0000 mg | INTRAMUSCULAR | Status: DC | PRN

## 2015-03-19 MED ORDER — CEFAZOLIN SODIUM-DEXTROSE 2-3 GM-% IV SOLR
INTRAVENOUS | Status: AC
  Administered 2015-03-19: 2 g via INTRAVENOUS
  Filled 2015-03-19: qty 50

## 2015-03-19 MED ORDER — BUPIVACAINE-EPINEPHRINE (PF) 0.25% -1:200000 IJ SOLN
INTRAMUSCULAR | Status: AC
  Filled 2015-03-19: qty 60

## 2015-03-19 MED ORDER — FENTANYL CITRATE (PF) 100 MCG/2ML IJ SOLN
25.0000 ug | INTRAMUSCULAR | Status: DC | PRN
Start: 2015-03-19 — End: 2015-03-19
  Administered 2015-03-19 (×2): 50 ug via INTRAVENOUS

## 2015-03-19 MED ORDER — ENOXAPARIN SODIUM 30 MG/0.3ML ~~LOC~~ SOLN
30.0000 mg | Freq: Two times a day (BID) | SUBCUTANEOUS | Status: DC
  Administered 2015-03-20: 30 mg via SUBCUTANEOUS
  Filled 2015-03-19: qty 0.3

## 2015-03-19 MED ORDER — SODIUM CHLORIDE 0.9 % IV SOLN
INTRAVENOUS | Status: DC | PRN
  Administered 2015-03-19: 14:00:00 via INTRAVENOUS

## 2015-03-19 MED ORDER — LIDOCAINE HCL (CARDIAC) 20 MG/ML IV SOLN
INTRAVENOUS | Status: DC | PRN
  Administered 2015-03-19: 40 mg via INTRAVENOUS

## 2015-03-19 MED ORDER — BUPIVACAINE-EPINEPHRINE (PF) 0.25% -1:200000 IJ SOLN
INTRAMUSCULAR | Status: DC | PRN
  Administered 2015-03-19: 53 mL

## 2015-03-19 MED ORDER — ACETAMINOPHEN 10 MG/ML IV SOLN
INTRAVENOUS | Status: AC
  Filled 2015-03-19: qty 100

## 2015-03-19 MED ORDER — MIDAZOLAM HCL 2 MG/2ML IJ SOLN
INTRAMUSCULAR | Status: DC | PRN
  Administered 2015-03-19: 2 mg via INTRAVENOUS

## 2015-03-19 MED ORDER — PROMETHAZINE HCL 25 MG/ML IJ SOLN: 6.2500 mg | INTRAMUSCULAR | Status: DC | PRN

## 2015-03-19 MED ORDER — ACETAMINOPHEN 160 MG/5ML PO SOLN
650.0000 mg | ORAL | Status: DC | PRN
  Administered 2015-03-20: 650 mg via ORAL
  Filled 2015-03-19: qty 20.3

## 2015-03-19 MED ORDER — SODIUM CHLORIDE 0.9 % IR SOLN
Status: DC | PRN
  Administered 2015-03-19: 50 mL

## 2015-03-19 MED ORDER — POTASSIUM CHLORIDE IN NACL 20-0.45 MEQ/L-% IV SOLN
INTRAVENOUS | Status: DC
  Administered 2015-03-19 – 2015-03-20 (×2): via INTRAVENOUS
  Filled 2015-03-19 (×4): qty 1000

## 2015-03-19 MED ORDER — LISINOPRIL 10 MG PO TABS
10.0000 mg | ORAL_TABLET | Freq: Every day | ORAL | Status: DC
  Administered 2015-03-20: 10 mg via ORAL
  Filled 2015-03-19: qty 1

## 2015-03-19 MED ORDER — ONDANSETRON HCL 4 MG/2ML IJ SOLN
INTRAMUSCULAR | Status: DC | PRN
  Administered 2015-03-19: 4 mg via INTRAVENOUS

## 2015-03-19 MED ORDER — HYDROMORPHONE HCL 1 MG/ML IJ SOLN
1.0000 mg | INTRAMUSCULAR | Status: DC | PRN
  Administered 2015-03-19 – 2015-03-20 (×3): 2 mg via INTRAVENOUS
  Filled 2015-03-19 (×3): qty 2

## 2015-03-19 MED ORDER — FENTANYL CITRATE (PF) 100 MCG/2ML IJ SOLN
INTRAMUSCULAR | Status: DC | PRN
  Administered 2015-03-19 (×2): 100 ug via INTRAVENOUS
  Administered 2015-03-19: 50 ug via INTRAVENOUS

## 2015-03-19 MED ORDER — PROPOFOL 10 MG/ML IV BOLUS
INTRAVENOUS | Status: DC | PRN
  Administered 2015-03-19: 160 mg via INTRAVENOUS

## 2015-03-19 MED ORDER — PANTOPRAZOLE SODIUM 40 MG IV SOLR
40.0000 mg | Freq: Every day | INTRAVENOUS | Status: DC
  Administered 2015-03-19: 40 mg via INTRAVENOUS
  Filled 2015-03-19: qty 40

## 2015-03-19 SURGICAL SUPPLY — 47 items
APPLIER CLIP ROT 10 11.4 M/L (STAPLE)
BANDAGE ELASTIC 6 CLIP NS LF (GAUZE/BANDAGES/DRESSINGS) ×4 IMPLANT
BLADE SURG SZ11 CARB STEEL (BLADE) ×2 IMPLANT
CANISTER SUCT 1200ML W/VALVE (MISCELLANEOUS) ×2 IMPLANT
CHLORAPREP W/TINT 26ML (MISCELLANEOUS) ×4 IMPLANT
CLIP APPLIE ROT 10 11.4 M/L (STAPLE) IMPLANT
DEFOGGER SCOPE WARMER CLEARIFY (MISCELLANEOUS) ×2 IMPLANT
DRAPE UTILITY 15X26 TOWEL STRL (DRAPES) ×4 IMPLANT
ENDOLOOP SUT PDS II  0 18 (SUTURE)
ENDOLOOP SUT PDS II 0 18 (SUTURE) IMPLANT
FILTER LAP SMOKE EVAC STRL (MISCELLANEOUS) ×2 IMPLANT
GLOVE BIO SURGEON STRL SZ7 (GLOVE) ×4 IMPLANT
GLOVE BIO SURGEON STRL SZ7.5 (GLOVE) ×6 IMPLANT
GLOVE BIOGEL PI IND STRL 7.5 (GLOVE) ×3 IMPLANT
GLOVE BIOGEL PI IND STRL 8.5 (GLOVE) ×1 IMPLANT
GLOVE BIOGEL PI INDICATOR 7.5 (GLOVE) ×3
GLOVE BIOGEL PI INDICATOR 8.5 (GLOVE) ×1
GLOVE SURG SYN 8.0 (GLOVE) ×2 IMPLANT
GOWN L4 LG 24 PK N/S (GOWN DISPOSABLE) ×8 IMPLANT
GOWN STRL REUS W/ TWL LRG LVL3 (GOWN DISPOSABLE) ×5 IMPLANT
GOWN STRL REUS W/TWL LRG LVL3 (GOWN DISPOSABLE) ×5
GRASPER SUT TROCAR 14GX15 (MISCELLANEOUS) ×2 IMPLANT
IRRIGATION STRYKERFLOW (MISCELLANEOUS) ×1 IMPLANT
IRRIGATOR STRYKERFLOW (MISCELLANEOUS) ×2
IV NS 1000ML (IV SOLUTION) ×1
IV NS 1000ML BAXH (IV SOLUTION) ×1 IMPLANT
KIT RM TURNOVER STRD PROC AR (KITS) ×2 IMPLANT
LABEL OR SOLS (LABEL) ×2 IMPLANT
LIQUID BAND (GAUZE/BANDAGES/DRESSINGS) ×4 IMPLANT
NDL SAFETY 22GX1.5 (NEEDLE) ×2 IMPLANT
NS IRRIG 500ML POUR BTL (IV SOLUTION) ×2 IMPLANT
PACK LAP CHOLECYSTECTOMY (MISCELLANEOUS) ×2 IMPLANT
RELOAD BLUE (STAPLE) ×6 IMPLANT
RELOAD GOLD (STAPLE) ×8 IMPLANT
RELOAD GREEN (STAPLE) ×2 IMPLANT
SHEARS HARMONIC ACE PLUS 45CM (MISCELLANEOUS) ×2 IMPLANT
SLEEVE ENDOPATH XCEL 5M (ENDOMECHANICALS) ×6 IMPLANT
SLEEVE GASTRECTOMY 36FR VISIGI (MISCELLANEOUS) ×2 IMPLANT
STAPLER ECHELON LONG 60 440 (INSTRUMENTS) ×2 IMPLANT
SUT DEVICE BRAIDED 0X39 (SUTURE) ×2 IMPLANT
SUT MNCRL AB 4-0 PS2 18 (SUTURE) ×2 IMPLANT
SUT VIC AB 0 CT2 27 (SUTURE) ×2 IMPLANT
SYR 20CC LL (SYRINGE) ×2 IMPLANT
TROCAR BLADELESS 15MM (ENDOMECHANICALS) ×2 IMPLANT
TROCAR XCEL NON-BLD 5MMX100MML (ENDOMECHANICALS) ×2 IMPLANT
TUBING INSUFFLATOR HEATED (MISCELLANEOUS) ×2 IMPLANT
WATER STERILE IRR 1000ML POUR (IV SOLUTION) ×2 IMPLANT

## 2015-03-19 NOTE — Op Note (Signed)
[name] [birth year]  PROCEDURE PERFORMED: Procedure(s): LAPAROSCOPIC GASTRIC SLEEVE RESECTION (N/A) PRE-OP DIAGNOSIS: MORBID OBESITY POST-OP DIAGNOSIS: morbid obesity ESTIMATED BLOOD LOSS: Minimal SURGEON: Everette Rank  ASSISTANT:Don Drinkwater, PA   PROCEDURE IN DETAIL:The patient was brought to the operating room and placed in supine position. General anesthesia obtained with orotracheal intubation. The patient had TED hose and Thro mboguards applied. A footboard applied at the end of the operative bed. The chest and abdomen were sterilely prepped and draped. A 5 mm Optiview trocar was introduced in the left upper quadrant of the abdomen under direct visualization. Pneumoperitoneum obtained. Three additional trocars introduced across the upper abdomen and a Nathanson liver retractor introduced through a subxiphoid defect. On elevation of the left lobe of the liver, no evidence of hiatal hernia noted.The pylorus was then marked and approximately 4 cm proximal to this, there was division of the vascular pedicles along the greater curvature of the stomach with the dissection extended superiorly over a distance of approximately 8 cm. Posterior attachments of the stomach to the pancreas were taken down by use of the Harmonic scalpel. This was then followed by a series of GIA staple firings to create a medially based gastric tube effect. First firing was a green load staple, placed in relative transverse direction in an effort to avoid narrowing of the incisura. A second gold load staple was also placed in a similar fashion. This was followed by introduction of a 1 [place] ViSiGi device into the stapled aspect of the antrum and the remaining portion of the stomach decompressed. The patient had additional firings of gold load staples brought out parallel to the lesser curvature of the stomach to create a medially-based gastric tube effect. As this was being performed, the ViSiGi device was  gradually withdrawn to create a consistent tubular effect. The stomach was occluded distally and with the ViSiGi device, insufflation of the stomach tube was performed. With a saline bath, no air leak identified. The ViSiGi device was withdrawn. The residual vascular pedicles of the greater curvature of the stomach divided, freeing both the gastrosplenic and gastrocolic ligaments. The lateral stomach then retrieved through the right upper quadrant 12 mm trocar site. After confirming hemostasis along the staple line, the apex of the stomach was affixed to the left crus with 0 Ethibond suture to prevent proximal migration of the stomach into the lower mediastinum. The fascia and peritoneum of the 12 mm trocar site closed with 0 Vicryl suture delivered with needle suture passing system and used to close this site.The pneumoperitoneum  relieved, the trocars removed, the wounds  injected with 0.25% Marcaine and closed with 4-0 Monocryl in the dermis followed by Dermabond. Patient  allowed to recover at this point having tolerated the procedure well.

## 2015-03-19 NOTE — Anesthesia Preprocedure Evaluation (Signed)
Anesthesia Evaluation  Patient identified by MRN, date of birth, ID band Patient awake    Reviewed: Allergy & Precautions, H&P , NPO status , Patient's Chart, lab work & pertinent test results, reviewed documented beta blocker date and time   History of Anesthesia Complications Negative for: history of anesthetic complications  Airway Mallampati: III  TM Distance: >3 FB Neck ROM: full    Dental no notable dental hx.    Pulmonary neg pulmonary ROS,    Pulmonary exam normal breath sounds clear to auscultation       Cardiovascular Exercise Tolerance: Good hypertension, On Medications (-) angina(-) CAD, (-) Past MI, (-) Cardiac Stents and (-) CABG Normal cardiovascular exam(-) dysrhythmias (-) Valvular Problems/Murmurs Rhythm:regular Rate:Normal     Neuro/Psych negative neurological ROS  negative psych ROS   GI/Hepatic negative GI ROS, Neg liver ROS,   Endo/Other  diabetes, Insulin DependentMorbid obesity  Renal/GU negative Renal ROS  negative genitourinary   Musculoskeletal   Abdominal   Peds  Hematology negative hematology ROS (+)   Anesthesia Other Findings Past Medical History:   Hypertension                                                 Diabetes mellitus without complication (HCC)                 Headache                                                     Reproductive/Obstetrics negative OB ROS                             Anesthesia Physical Anesthesia Plan  ASA: III  Anesthesia Plan: General   Post-op Pain Management:    Induction:   Airway Management Planned:   Additional Equipment:   Intra-op Plan:   Post-operative Plan:   Informed Consent: I have reviewed the patients History and Physical, chart, labs and discussed the procedure including the risks, benefits and alternatives for the proposed anesthesia with the patient or authorized representative who has indicated  his/her understanding and acceptance.   Dental Advisory Given  Plan Discussed with: Anesthesiologist, CRNA and Surgeon  Anesthesia Plan Comments:         Anesthesia Quick Evaluation

## 2015-03-19 NOTE — Anesthesia Procedure Notes (Signed)
Procedure Name: Intubation Date/Time: 03/19/2015 2:47 PM Performed by: Henrietta Hoover Pre-anesthesia Checklist: Patient identified, Emergency Drugs available, Suction available, Patient being monitored and Timeout performed Patient Re-evaluated:Patient Re-evaluated prior to inductionOxygen Delivery Method: Circle system utilized Preoxygenation: Pre-oxygenation with 100% oxygen Intubation Type: IV induction Ventilation: Oral airway inserted - appropriate to patient size Laryngoscope Size: Mac and 3 Grade View: Grade I Number of attempts: 1 Airway Equipment and Method: Stylet Placement Confirmation: ETT inserted through vocal cords under direct vision,  positive ETCO2 and breath sounds checked- equal and bilateral Secured at: 21 cm Tube secured with: Tape Dental Injury: Teeth and Oropharynx as per pre-operative assessment

## 2015-03-19 NOTE — Transfer of Care (Signed)
Immediate Anesthesia Transfer of Care Note  [name]  Procedure(s) Performed: Procedure(s): LAPAROSCOPIC GASTRIC SLEEVE RESECTION (N/A)  Patient Location: PACU  Anesthesia Type:General  Level of Consciousness: sedated  Airway & Oxygen Therapy: Patient Spontanous Breathing and Patient connected to face mask oxygen  Post-op Assessment: Report given to RN and Post -op Vital signs reviewed and stable  Post vital signs: Reviewed and stable  Last Vitals:  Filed Vitals:   03/19/15 1546  BP: 168/84  Pulse: 85  Temp: 37.2 C  Resp: 30    Complications: No apparent anesthesia complications

## 2015-03-19 NOTE — Interval H&P Note (Signed)
History and Physical Interval Note:  03/19/2015 1:59 PM  [name]  has presented today for surgery, with the diagnosis of MORBID OBESITY  The various methods of treatment have been discussed with the patient and family. After consideration of risks, benefits and other options for treatment, the patient has consented to  Procedure(s): LAPAROSCOPIC GASTRIC SLEEVE RESECTION (N/A) as a surgical intervention .  The patient's history has been reviewed, patient examined, no change in status, stable for surgery.  I have reviewed the patient's chart and labs.  Questions were answered to the patient's satisfaction.     Everette Rank

## 2015-03-19 NOTE — Brief Op Note (Signed)
03/19/2015  3:44 PM  PATIENT:  [name]  29 y.o. female  PRE-OPERATIVE DIAGNOSIS:  MORBID OBESITY  POST-OPERATIVE DIAGNOSIS:  morbid obesity  PROCEDURE:  Procedure(s): LAPAROSCOPIC GASTRIC SLEEVE RESECTION (N/A)  SURGEON:  Surgeon(s) and Role:    * Everette Rank, MD - Primary  PHYSICIAN ASSISTANT: Macaria Bias, PA-C  ANESTHESIA:   general  EBL:  Total I/O In: 2200 [I.V.:2200] Out: -   BLOOD ADMINISTERED:none  DRAINS: none   LOCAL MEDICATIONS USED:  MARCAINE    and LIDOCAINE   SPECIMEN:  Source of Specimen:  Stomach, resected  DISPOSITION OF SPECIMEN:  PATHOLOGY  COUNTS:  YES  TOURNIQUET:  * No tourniquets in log *  DICTATION: .Dragon Dictation  PLAN OF CARE: Admit to inpatient   PATIENT DISPOSITION:  PACU - hemodynamically stable.   Delay start of Pharmacological VTE agent (>24hrs) due to surgical blood loss or risk of bleeding: no

## 2015-03-19 NOTE — H&P (Signed)
History and physical on paper chart, no change in history and physical.

## 2015-03-20 ENCOUNTER — Encounter: Payer: Self-pay | Admitting: Bariatrics

## 2015-03-20 LAB — COMPREHENSIVE METABOLIC PANEL
ALK PHOS: 55 U/L (ref 38–126)
ALT: 36 U/L (ref 14–54)
AST: 26 U/L (ref 15–41)
Albumin: 3.6 g/dL (ref 3.5–5.0)
Anion gap: 6 (ref 5–15)
CHLORIDE: 107 mmol/L (ref 101–111)
CO2: 25 mmol/L (ref 22–32)
CREATININE: 0.46 mg/dL (ref 0.44–1.00)
Calcium: 8.8 mg/dL — ABNORMAL LOW (ref 8.9–10.3)
GFR calc Af Amer: >60 mL/min (ref >60)
Glucose, Bld: 188 mg/dL — ABNORMAL HIGH (ref 65–99)
Potassium: 4.8 mmol/L (ref 3.5–5.1)
Sodium: 138 mmol/L (ref 135–145)
Total Bilirubin: 0.2 mg/dL — ABNORMAL LOW (ref 0.3–1.2)
Total Protein: 7.2 g/dL (ref 6.5–8.1)

## 2015-03-20 LAB — CBC WITH DIFFERENTIAL/PLATELET
BASOS ABS: 0 10*3/uL (ref 0–0.1)
Basophils Relative: 0 %
EOS PCT: 0 %
Eosinophils Absolute: 0 10*3/uL (ref 0–0.7)
HEMATOCRIT: 38.3 % (ref 35.0–47.0)
HEMOGLOBIN: 12.6 g/dL (ref 12.0–16.0)
LYMPHS PCT: 18 %
Lymphs Abs: 1.9 10*3/uL (ref 1.0–3.6)
MCH: 25 pg — ABNORMAL LOW (ref 26.0–34.0)
MCHC: 32.8 g/dL (ref 32.0–36.0)
MCV: 76.2 fL — AB (ref 80.0–100.0)
Monocytes Absolute: 0.4 10*3/uL (ref 0.2–0.9)
Monocytes Relative: 4 %
NEUTROS ABS: 8.2 10*3/uL — AB (ref 1.4–6.5)
NEUTROS PCT: 78 %
PLATELETS: 293 10*3/uL (ref 150–440)
RBC: 5.02 MIL/uL (ref 3.80–5.20)
RDW: 14.9 % — ABNORMAL HIGH (ref 11.5–14.5)
WBC: 10.5 10*3/uL (ref 3.6–11.0)

## 2015-03-20 MED ORDER — HYDROCODONE-ACETAMINOPHEN 7.5-325 MG/15ML PO SOLN: 5.0000 mL | ORAL | Status: DC | PRN

## 2015-03-20 MED ORDER — ENOXAPARIN SODIUM 30 MG/0.3ML ~~LOC~~ SOLN: 30.0000 mg | Freq: Two times a day (BID) | SUBCUTANEOUS | Status: DC

## 2015-03-20 MED ORDER — UNJURY CHICKEN SOUP POWDER
2.0000 [oz_av] | Freq: Three times a day (TID) | ORAL | Status: DC
  Administered 2015-03-20: 2 [oz_av] via ORAL

## 2015-03-20 MED ORDER — INSULIN GLARGINE 100 UNIT/ML ~~LOC~~ SOLN
20.0000 [IU] | Freq: Two times a day (BID) | SUBCUTANEOUS | Status: DC
  Administered 2015-03-20: 20 [IU] via SUBCUTANEOUS
  Filled 2015-03-20 (×2): qty 0.2

## 2015-03-20 NOTE — H&P (Signed)
Patient Demographics  [name], is a 29 y.o. female   MRN: [id number]   DOB - 02-09-1986  Admit Date - 03/19/2015    Outpatient Primary MD for the patient is Kerby Nora, MD  Consult requested in the Hospital by Everette Rank, MD, On 03/20/2015    Reason for consult; diabetes management,  29 year old morbidly obese female is admitted for a gastric  Sleeve surgery . we were consulted the management of diabetes, post discharge medications. Patient had a gastric sleeve operation by Dr. Alva Garnet  yesterday and patient plan to discharge home today. She is right now on clear liquid diet and she will be on clear liquids for  2 weeks. She denies any other complaints. Has diabetes for last 4 years.  Past Medical History  Diagnosis Date  . Hypertension   . Diabetes mellitus without complication (HCC)   . Headache       Past Surgical History  Procedure Laterality Date  . Cesarean section  04/30/2009    decreased fetal heart tones due to umbilical cord wrapped  . Cholecystectomy    . Cesarean section    . Laparoscopic gastric sleeve resection N/A 03/19/2015    Procedure: LAPAROSCOPIC GASTRIC SLEEVE RESECTION;  Surgeon: Everette Rank, MD;  Location: [facility] ORS;  Service: General;  Laterality: N/A;    in for   No chief complaint on file.    HPI  [name]  is a 29 y.o. female admitted to Dr. Alva Garnet service. Patient had a gastric sleeve operation yesterday. We are consulted for management of diabetes. Patient is on clear liquids. She is taking clear liquids and she will be on clear liquids for the next 2 weeks.  Denies abdominal pain, nausea, vomiting.  Review of Systems    In addition to the HPI above,  No Fever-chills, No Headache, No changes with Vision or hearing, No problems swallowing food or Liquids, No  Chest pain, Cough or Shortness of Breath, No Abdominal pain, No Nausea or Vommitting, Bowel movements are regular, No Blood in stool or Urine, No dysuria, No new skin rashes or bruises, No new joints pains-aches,  No new weakness, tingling, numbness in any extremity, No recent weight gain or loss, No polyuria, polydypsia or polyphagia, No significant Mental Stressors.  A full 10 point Review of Systems was done, except as stated above, all other Review of Systems were negative.   Social History Social History  Substance Use Topics  . Smoking status: Never Smoker   . Smokeless tobacco: Never Used  . Alcohol Use: No     Comment: occassionally , rare     Family History Family History  Problem Relation Age of Onset  . Diabetes Father   . Hypertension Father   . Hyperlipidemia Father   . Hypertension Sister   . Birth defects Sister     fluid on brain cause blindness in B eye     Prior to Admission medications   Medication Sig Start Date End Date Taking?  Authorizing Provider  exenatide (BYETTA) 10 MCG/0.04ML SOPN injection Inject 0.04 mLs (10 mcg total) into the skin 2 (two) times daily with a meal. 01/29/15  Yes Amy E Bedsole, MD  Insulin Glargine (LANTUS SOLOSTAR) 100 UNIT/ML Solostar Pen INJECT 45 UNITS INTO THE SKIN 2 TIMES DAILY. 01/29/15  Yes Amy E Bedsole, MD  lisinopril (PRINIVIL,ZESTRIL) 10 MG tablet TAKE 1 TABLET BY MOUTH ONCE DAILY 07/23/14  Yes Amy Michelle Nasuti, MD  UNIFINE PENTIPS 31G X 5 MM MISC USE 1 PENTIP AS DIRECTED. USE AS DIRECTED WITH LANTUS SOLOSTAR. 12/07/14  Yes Amy E Bedsole, MD  enoxaparin (LOVENOX) 30 MG/0.3ML injection Inject 0.3 mLs (30 mg total) into the skin every 12 (twelve) hours. 03/20/15   Everette Rank, MD  HYDROcodone-acetaminophen (HYCET) 7.5-325 mg/15 ml solution Take 5-10 mLs by mouth every 4 (four) hours as needed for moderate pain or severe pain. 03/20/15   Everette Rank, MD    Anti-infectives    Start     Dose/Rate Route Frequency Ordered  Stop   03/19/15 1245  ceFAZolin (ANCEF) IVPB 2 g/50 mL premix     2 g 100 mL/hr over 30 Minutes Intravenous  Once 03/19/15 1234 03/19/15 1446      Scheduled Meds: . enoxaparin (LOVENOX) injection  30 mg Subcutaneous Q12H  . insulin glargine  20 Units Subcutaneous BID  . lisinopril  10 mg Oral Daily  . pantoprazole (PROTONIX) IV  40 mg Intravenous QHS  . protein supplement  2 oz Oral TID  . scopolamine  1 patch Transdermal Q72H   Continuous Infusions: . 0.45 % NaCl with KCl 20 mEq / L 125 mL/hr at 03/20/15 0559   PRN Meds:.acetaminophen (TYLENOL) oral liquid 160 mg/5 mL, HYDROcodone-acetaminophen, HYDROmorphone (DILAUDID) injection, ondansetron (ZOFRAN) IV  Allergies  Allergen Reactions  . Tomato Rash    Physical Exam  Vitals  Blood pressure 155/102, pulse 72, temperature 98.2 F (36.8 C), temperature source Oral, resp. rate 16, height 5\' 6"  (1.676 m), weight 120.203 kg (265 lb), SpO2 100 %.   1. General alert, oriented, in bed in NAD,  * 2. Normal affect and insight, Not Suicidal or Homicidal, Awake Alert, Oriented X 3.  3. No F.N deficits, ALL C.Nerves Intact, Strength 5/5 all 4 extremities, Sensation intact all 4 extremities, Plantars down going.  4. Ears and Eyes appear Normal, Conjunctivae clear, PERRLA. Moist Oral Mucosa.  5. Supple Neck, No JVD, No cervical lymphadenopathy appriciated, No Carotid Bruits.  6. Symmetrical Chest wall movement, Good air movement bilaterally, CTAB.  7. RRR, No Gallops, Rubs or Murmurs, No Parasternal Heave.  8. Positive Bowel Sounds, Abdomen Soft, No tenderness, No organomegaly appriciated,No rebound -guarding or rigidity.  9.  No Cyanosis, Normal Skin Turgor, No Skin Rash or Bruise.  10. Good muscle tone,  joints appear normal , no effusions, Normal ROM.  11. No Palpable Lymph Nodes in Neck or Axillae  Data Review  CBC  Recent Labs Lab 03/19/15 1859 03/20/15 0543  WBC 17.0* 10.5  HGB 13.2 12.6  HCT 40.1 38.3  PLT 307  293  MCV 75.6* 76.2*  MCH 24.9* 25.0*  MCHC 33.0 32.8  RDW 15.0* 14.9*  LYMPHSABS  --  1.9  MONOABS  --  0.4  EOSABS  --  0.0  BASOSABS  --  0.0   ------------------------------------------------------------------------------------------------------------------  Chemistries   Recent Labs Lab 03/19/15 1859 03/20/15 0543  NA  --  138  K  --  4.8  CL  --  107  CO2  --  25  GLUCOSE  --  188*  BUN  --  <5*  CREATININE 0.56 0.46  CALCIUM  --  8.8*  AST  --  26  ALT  --  36  ALKPHOS  --  55  BILITOT  --  0.2*   ------------------------------------------------------------------------------------------------------------------ estimated creatinine clearance is 140.8 mL/min (by C-G formula based on Cr of 0.46). ------------------------------------------------------------------------------------------------------------------ No results for input(s): TSH, T4TOTAL, T3FREE, THYROIDAB in the last 72 hours.  Invalid input(s): FREET3   Coagulation profile No results for input(s): INR, PROTIME in the last 168 hours. ------------------------------------------------------------------------------------------------------------------- No results for input(s): DDIMER in the last 72 hours. -------------------------------------------------------------------------------------------------------------------  Cardiac Enzymes No results for input(s): CKMB, TROPONINI, MYOGLOBIN in the last 168 hours.  Invalid input(s): CK ------------------------------------------------------------------------------------------------------------------ Invalid input(s): POCBNP   ---------------------------------------------------------------------------------------------------------------  Urinalysis    Component Value Date/Time   COLORURINE COLORLESS* 11/26/2014 1740   COLORURINE Straw 06/11/2014 1155   APPEARANCEUR CLEAR* 11/26/2014 1740   APPEARANCEUR Clear 06/11/2014 1155   LABSPEC 1.026 11/26/2014  1740   LABSPEC 1.010 06/11/2014 1155   PHURINE 6.0 11/26/2014 1740   PHURINE 5.0 06/11/2014 1155   GLUCOSEU >500* 11/26/2014 1740   GLUCOSEU >=500 06/11/2014 1155   HGBUR NEGATIVE 11/26/2014 1740   HGBUR Negative 06/11/2014 1155   BILIRUBINUR NEGATIVE 11/26/2014 1740   BILIRUBINUR Negative 06/11/2014 1155   KETONESUR NEGATIVE 11/26/2014 1740   KETONESUR Negative 06/11/2014 1155   PROTEINUR NEGATIVE 11/26/2014 1740   PROTEINUR Negative 06/11/2014 1155   NITRITE NEGATIVE 11/26/2014 1740   NITRITE Negative 06/11/2014 1155   LEUKOCYTESUR TRACE* 11/26/2014 1740   LEUKOCYTESUR 1+ 06/11/2014 1155     Imaging results:   No results found.    Assessment & Plan  Active Problems:   Bariatric surgery status    1. #1 bariatric surgery for moderate obesity: Dr. Sedalia Muta is following, patient is on clear liquids only. Diabetes mellitus type 2 H&P metformin is stopped I recommend stopping the metformin, changing the Lantus to 20 units twice a day. Patient understands that. The patient to is advised to have a glucometer and check the blood glucose every day.am.  #2 hypertension patient likes and lisinopril, HCTZ: Advised to continue that. #3 patient can go home, resume Lantus at half the dose 20 units twice a day. And hold metformin till seen by DR.Tyner  DVT Prophylaxis Heparin -  Lovenox - SCDs   AM Labs Ordered, also please review Full Orders  Family Communication: Plan discussed with patient and **   Thank you for the consult, we will follow the patient with you in the Hospital.   [facility] M.D on 03/20/2015 at 11:54 AM

## 2015-03-20 NOTE — Progress Notes (Signed)
Pt ambulating around nurses station X3 times during 2000, 0000, 0600. Tolerated very well. Pt adheres to Bariatric liquid diet, and tolerated well. Only one episode of nausea, pt declined to take antinausea med and decided to wait. Pt stated " if the nausea returns I will call you". Pt noted with surgical abd pain, 9/10 and 8/10. Prn pain med given, see mar. Pt with pain relief rated 4/10/ Will continue to monitor. Bariatric handout given.

## 2015-03-20 NOTE — Progress Notes (Signed)
INTERVENTION:  RD consulted for nutrition education regarding inpatient bariatric surgery.   RD provided "The Liquid Diet" handout from the Bariatric Surgery Guide from the [facility]. This handout previously provided to patient prior to surgery is a duplicate copy. Discussed what foods/liquids are consistent with a Clear Liquid Diet and reinforced Key Concepts such as no carbonation, no caffeine, or sugar containing beverages. Provided methods to prevent dehydration and promote protein intake, using clock and sample fluid schedule. RD encouraged follow-up with outpatient dietitian after discharge.  Teach back method used.  Expect good compliance.  NUTRITION DIAGNOSIS:  Food and nutrition knowledge related deficit related to recent bariatric surgery as evidenced by dietitian consult for nutrition education   GOAL:  Patient will be able to sip and tolerate CL within 24-48 hours  MONITOR:  Energy intake Digestive system  ASSESSMENT:  Pt s/p gastric sleeve   Body mass index is 42.79 kg/(m^2).   Current diet order is bariatric clear liquids, patient is consuming approximately 30ml at this time.   Labs and medications reviewed.   LOW Care Level  Arien Morine B. Freida Busman, RD, LDN [phone] (pager)

## 2015-03-20 NOTE — Progress Notes (Signed)
Pt d/c to home today.  IV removed intact.  Sister at bedside for escort home.  Volunteer called for [employer].  Rx's given to pt and all questions and concerns addressed.

## 2015-03-21 LAB — SURGICAL PATHOLOGY

## 2015-03-21 NOTE — Anesthesia Postprocedure Evaluation (Signed)
  Anesthesia Post-op Note  [name]  Procedure(s) Performed: Procedure(s): LAPAROSCOPIC GASTRIC SLEEVE RESECTION (N/A)  Anesthesia type:General  Patient location: PACU  Post pain: Pain level controlled  Post assessment: Post-op Vital signs reviewed, Patient's Cardiovascular Status Stable, Respiratory Function Stable, Patent Airway and No signs of Nausea or vomiting  Post vital signs: Reviewed and stable  Last Vitals:  Filed Vitals:   03/20/15 0837  BP: 155/102  Pulse: 72  Temp: 36.8 C  Resp: 16    Level of consciousness: awake, alert  and patient cooperative  Complications: No apparent anesthesia complications

## 2015-03-24 NOTE — Discharge Summary (Signed)
Physician Discharge Summary  Patient ID: [name] MRN: [id number] DOB/AGE: Jan 21, 1986 28 y.o.  Admit date: 03/19/2015 Discharge date: 03/20/15 Admission Diagnoses:morbid obesity with diabetes  Discharge Diagnoses: same Active Problems:   Bariatric surgery status    Procedure(s): LAPAROSCOPIC GASTRIC SLEEVE RESECTION (N/A)  Discharged Condition: good  Hospital Course: Tolerated operative procedure well. Began ambulation early and tolerated po well. Stable on POD #1and d/c to home. Consults: hospitalist  Significant Diagnostic Studies: none  Treatments: IV hydration and procedures: sleeve gastrectomy  Discharge Exam: Blood pressure 155/102, pulse 72, temperature 98.2 F (36.8 C), temperature source Oral, resp. rate 16, height  (1.676 m), weight 120.203 kg (265 lb), SpO2 100 %. General appearance: alert and no distress Chest wall: no tenderness, clear bilat GI: soft, non-tender; bowel sounds normal; no masses,  no organomegaly  Disposition: 01-Home or Self Care  Discharge Instructions    Ambulate hourly while awake    Complete by:  As directed      Call MD for:  difficulty breathing, headache or visual disturbances    Complete by:  As directed      Call MD for:  persistant dizziness or light-headedness    Complete by:  As directed      Call MD for:  persistant nausea and vomiting    Complete by:  As directed      Call MD for:  redness, tenderness, or signs of infection (pain, swelling, redness, odor or green/yellow discharge around incision site)    Complete by:  As directed      Call MD for:  severe uncontrolled pain    Complete by:  As directed      Call MD for:  temperature >101 F    Complete by:  As directed      Diet bariatric full liquid    Complete by:  As directed      Discharge instructions    Complete by:  As directed   Patient to use one half of prior lantus dosing. Continue accu checks at home q 6 hours. Reduce lantus by 5 units for glucose  less than 110     Incentive spirometry    Complete by:  As directed   Perform hourly while awake            Medication List    STOP taking these medications        exenatide 10 MCG/0.04ML Sopn injection  Commonly known as:  BYETTA      TAKE these medications        enoxaparin 30 MG/0.3ML injection  Commonly known as:  LOVENOX  Inject 0.3 mLs (30 mg total) into the skin every 12 (twelve) hours.     HYDROcodone-acetaminophen 7.5-325 mg/15 ml solution  Commonly known as:  HYCET  Take 5-10 mLs by mouth every 4 (four) hours as needed for moderate pain or severe pain.     Insulin Glargine 100 UNIT/ML Solostar Pen  Commonly known as:  LANTUS SOLOSTAR  INJECT 45 UNITS INTO THE SKIN 2 TIMES DAILY.     lisinopril 10 MG tablet  Commonly known as:  PRINIVIL,ZESTRIL  TAKE 1 TABLET BY MOUTH ONCE DAILY     UNIFINE PENTIPS 31G X 5 MM Misc  Generic drug:  Insulin Pen Needle  USE 1 PENTIP AS DIRECTED. USE AS DIRECTED WITH LANTUS SOLOSTAR.         Signed: Everette Rank 03/24/2015, 8:50 PM

## 2015-04-05 ENCOUNTER — Emergency Department
Admission: EM | Admit: 2015-04-05 | Discharge: 2015-04-05 | Disposition: A | Discharge status: Home / Self Care | Payer: 59 | Attending: Emergency Medicine | Admitting: Emergency Medicine

## 2015-04-05 ENCOUNTER — Emergency Department: Payer: 59

## 2015-04-05 ENCOUNTER — Encounter: Payer: Self-pay | Admitting: Emergency Medicine

## 2015-04-05 DIAGNOSIS — N390 Urinary tract infection, site not specified: Secondary | ICD-10-CM | POA: Diagnosis not present

## 2015-04-05 DIAGNOSIS — R319 Hematuria, unspecified: Secondary | ICD-10-CM

## 2015-04-05 DIAGNOSIS — R1011 Right upper quadrant pain: Secondary | ICD-10-CM | POA: Diagnosis present

## 2015-04-05 DIAGNOSIS — G8918 Other acute postprocedural pain: Secondary | ICD-10-CM | POA: Diagnosis not present

## 2015-04-05 DIAGNOSIS — E119 Type 2 diabetes mellitus without complications: Secondary | ICD-10-CM | POA: Insufficient documentation

## 2015-04-05 DIAGNOSIS — Z794 Long term (current) use of insulin: Secondary | ICD-10-CM | POA: Insufficient documentation

## 2015-04-05 DIAGNOSIS — Z79899 Other long term (current) drug therapy: Secondary | ICD-10-CM | POA: Insufficient documentation

## 2015-04-05 DIAGNOSIS — R112 Nausea with vomiting, unspecified: Secondary | ICD-10-CM | POA: Diagnosis not present

## 2015-04-05 DIAGNOSIS — Z3202 Encounter for pregnancy test, result negative: Secondary | ICD-10-CM | POA: Insufficient documentation

## 2015-04-05 DIAGNOSIS — I10 Essential (primary) hypertension: Secondary | ICD-10-CM | POA: Insufficient documentation

## 2015-04-05 DIAGNOSIS — E876 Hypokalemia: Secondary | ICD-10-CM | POA: Insufficient documentation

## 2015-04-05 LAB — CBC WITH DIFFERENTIAL/PLATELET
Basophils Absolute: 0.1 10*3/uL (ref 0–0.1)
Basophils Relative: 1 %
Eosinophils Absolute: 0.3 10*3/uL (ref 0–0.7)
Eosinophils Relative: 4 %
HCT: 38.6 % (ref 35.0–47.0)
Hemoglobin: 12.6 g/dL (ref 12.0–16.0)
Lymphocytes Relative: 33 %
Lymphs Abs: 2.1 10*3/uL (ref 1.0–3.6)
MCH: 24.7 pg — ABNORMAL LOW (ref 26.0–34.0)
MCHC: 32.6 g/dL (ref 32.0–36.0)
MCV: 75.6 fL — ABNORMAL LOW (ref 80.0–100.0)
Monocytes Absolute: 0.7 10*3/uL (ref 0.2–0.9)
Monocytes Relative: 10 %
Neutro Abs: 3.2 10*3/uL (ref 1.4–6.5)
Neutrophils Relative %: 52 %
Platelets: 292 10*3/uL (ref 150–440)
RBC: 5.11 MIL/uL (ref 3.80–5.20)
RDW: 14.6 % — ABNORMAL HIGH (ref 11.5–14.5)
WBC: 6.3 10*3/uL (ref 3.6–11.0)

## 2015-04-05 LAB — URINALYSIS COMPLETE WITH MICROSCOPIC (ARMC ONLY)
Bilirubin Urine: NEGATIVE
Glucose, UA: NEGATIVE mg/dL
Nitrite: NEGATIVE
Protein, ur: 100 mg/dL — AB
Specific Gravity, Urine: 1.021 (ref 1.005–1.030)
pH: 5 (ref 5.0–8.0)

## 2015-04-05 LAB — COMPREHENSIVE METABOLIC PANEL WITH GFR
ALT: 18 U/L (ref 14–54)
AST: 15 U/L (ref 15–41)
Albumin: 3.4 g/dL — ABNORMAL LOW (ref 3.5–5.0)
Alkaline Phosphatase: 51 U/L (ref 38–126)
Anion gap: 7 (ref 5–15)
BUN: <5 mg/dL — ABNORMAL LOW (ref 6–20)
CO2: 25 mmol/L (ref 22–32)
Calcium: 8.4 mg/dL — ABNORMAL LOW (ref 8.9–10.3)
Chloride: 109 mmol/L (ref 101–111)
Creatinine, Ser: 0.59 mg/dL (ref 0.44–1.00)
GFR calc Af Amer: >60 mL/min
GFR calc non Af Amer: >60 mL/min
Glucose, Bld: 121 mg/dL — ABNORMAL HIGH (ref 65–99)
Potassium: 3 mmol/L — ABNORMAL LOW (ref 3.5–5.1)
Sodium: 141 mmol/L (ref 135–145)
Total Bilirubin: 0.3 mg/dL (ref 0.3–1.2)
Total Protein: 6.5 g/dL (ref 6.5–8.1)

## 2015-04-05 LAB — LIPASE, BLOOD: Lipase: 21 U/L (ref 11–51)

## 2015-04-05 LAB — POCT PREGNANCY, URINE: Preg Test, Ur: NEGATIVE

## 2015-04-05 MED ORDER — IOHEXOL 240 MG/ML SOLN
25.0000 mL | Freq: Once | INTRAMUSCULAR | Status: AC | PRN
  Administered 2015-04-05: 25 mL via ORAL

## 2015-04-05 MED ORDER — IOHEXOL 350 MG/ML SOLN
125.0000 mL | Freq: Once | INTRAVENOUS | Status: AC | PRN
  Administered 2015-04-05: 125 mL via INTRAVENOUS

## 2015-04-05 MED ORDER — ONDANSETRON HCL 4 MG/2ML IJ SOLN
INTRAMUSCULAR | Status: AC
Start: 2015-04-05 — End: 2015-04-05
  Administered 2015-04-05: 4 mg via INTRAVENOUS
  Filled 2015-04-05: qty 2

## 2015-04-05 MED ORDER — DEXTROSE 5 % IV SOLN
1.0000 g | Freq: Once | INTRAVENOUS | Status: AC
  Administered 2015-04-05: 1 g via INTRAVENOUS
  Filled 2015-04-05: qty 10

## 2015-04-05 MED ORDER — ONDANSETRON HCL 4 MG/2ML IJ SOLN
4.0000 mg | Freq: Once | INTRAMUSCULAR | Status: AC
  Administered 2015-04-05: 4 mg via INTRAVENOUS

## 2015-04-05 MED ORDER — ALUM & MAG HYDROXIDE-SIMETH 200-200-20 MG/5ML PO SUSP
15.0000 mL | Freq: Once | ORAL | Status: AC
  Administered 2015-04-05: 15 mL via ORAL
  Filled 2015-04-05: qty 30

## 2015-04-05 MED ORDER — POTASSIUM CHLORIDE CRYS ER 20 MEQ PO TBCR
40.0000 meq | EXTENDED_RELEASE_TABLET | Freq: Once | ORAL | Status: AC
  Administered 2015-04-05: 40 meq via ORAL
  Filled 2015-04-05: qty 2

## 2015-04-05 MED ORDER — CEPHALEXIN 500 MG PO CAPS: 500.0000 mg | ORAL_CAPSULE | Freq: Two times a day (BID) | ORAL | Status: DC

## 2015-04-05 NOTE — Discharge Instructions (Signed)
You were evaluated for nausea and vomiting for several weeks after your gastric sleeve, and your exam and evaluation are reassuring in the emergency department. You had slightly low potassium were given potassium supplement. Her CAT scan showed no sign of emergency condition or postoperative complication.  You are being treated for a urinary tract infection. You're given a 24-hour dose of Rocephin and topotecan emergency department. You may start your Keflex pills tomorrow.  Return to the emergency department for any new or worsening condition including new or worsening abdominal pain, vomiting yellow or bile, or any concern for dehydration, fever, or any other symptoms concerning to you.   Urinary Tract Infection Urinary tract infections (UTIs) can develop anywhere along your urinary tract. Your urinary tract is your body's drainage system for removing wastes and extra water. Your urinary tract includes two kidneys, two ureters, a bladder, and a urethra. Your kidneys are a pair of bean-shaped organs. Each kidney is about the size of your fist. They are located below your ribs, one on each side of your spine. CAUSES Infections are caused by microbes, which are microscopic organisms, including fungi, viruses, and bacteria. These organisms are so small that they can only be seen through a microscope. Bacteria are the microbes that most commonly cause UTIs. SYMPTOMS  Symptoms of UTIs may vary by age and gender of the patient and by the location of the infection. Symptoms in young women typically include a frequent and intense urge to urinate and a painful, burning feeling in the bladder or urethra during urination. Older women and men are more likely to be tired, shaky, and weak and have muscle aches and abdominal pain. A fever may mean the infection is in your kidneys. Other symptoms of a kidney infection include pain in your back or sides below the ribs, nausea, and vomiting. DIAGNOSIS To diagnose a  UTI, your caregiver will ask you about your symptoms. Your caregiver will also ask you to provide a urine sample. The urine sample will be tested for bacteria and white blood cells. White blood cells are made by your body to help fight infection. TREATMENT  Typically, UTIs can be treated with medication. Because most UTIs are caused by a bacterial infection, they usually can be treated with the use of antibiotics. The choice of antibiotic and length of treatment depend on your symptoms and the type of bacteria causing your infection. HOME CARE INSTRUCTIONS  If you were prescribed antibiotics, take them exactly as your caregiver instructs you. Finish the medication even if you feel better after you have only taken some of the medication.  Drink enough water and fluids to keep your urine clear or pale yellow.  Avoid caffeine, tea, and carbonated beverages. They tend to irritate your bladder.  Empty your bladder often. Avoid holding urine for long periods of time.  Empty your bladder before and after sexual intercourse.  After a bowel movement, women should cleanse from front to back. Use each tissue only once. SEEK MEDICAL CARE IF:   You have back pain.  You develop a fever.  Your symptoms do not begin to resolve within 3 days. SEEK IMMEDIATE MEDICAL CARE IF:   You have severe back pain or lower abdominal pain.  You develop chills.  You have nausea or vomiting.  You have continued burning or discomfort with urination. MAKE SURE YOU:   Understand these instructions.  Will watch your condition.  Will get help right away if you are not doing well or get  worse.   This information is not intended to replace advice given to you by your health care provider. Make sure you discuss any questions you have with your health care provider.   Document Released: 03/11/2005 Document Revised: 02/20/2015 Document Reviewed: 07/10/2011 Elsevier Interactive Patient Education [employer].

## 2015-04-05 NOTE — ED Notes (Addendum)
Pt reports she can't drink PO contrast; MD informed, told to send her to CT. CT called and informed.

## 2015-04-05 NOTE — ED Notes (Signed)
Pt vomiting in CT 

## 2015-04-05 NOTE — ED Notes (Signed)
Pt post gastric sleeve 10/04, now having abd pain and feels like having heartburn and unable to keep anything down. Been going on for two weeks.

## 2015-04-05 NOTE — ED Notes (Signed)
Pt pending d/c, no d/c papers printed at this time.

## 2015-04-05 NOTE — ED Notes (Signed)
MD at bedside. 

## 2015-04-05 NOTE — ED Provider Notes (Addendum)
[facility] Emergency Department Provider Note   ____________________________________________  Time seen: On arrival to ED room I have reviewed the triage vital signs and the triage nursing note.  HISTORY  Chief Complaint Abdominal Pain and Post-op Problem   Historian Patient  HPI [name] is a 29 y.o. female who is here for evaluation of upper abdominal pain and right upper abdominal pain which is been going on now since a few days after her gastric sleeve was placed. Gastric tube was placed 03/19/15 by Dr. Alva Garnetyner here at the hospital. She did okay for a few days, then reports trouble eating due to nausea and abdominal pain, followed by vomiting. She states anything she eats comes right back up. She has been on a liquid diet. Pain is moderate 5 or 6 out of 10. No fever. She has had some dysuria.    Past Medical History  Diagnosis Date  . Hypertension   . Diabetes mellitus without complication (HCC)   . Headache     Patient Active Problem List   Diagnosis Date Noted  . Bariatric surgery status 03/19/2015  . Morbid obesity (HCC) 05/16/2013  . Diabetes mellitus without complication poor control 09/22/2012  . HTN (hypertension) 09/22/2012    Past Surgical History  Procedure Laterality Date  . Cesarean section  04/30/2009    decreased fetal heart tones due to umbilical cord wrapped  . Cholecystectomy    . Cesarean section    . Laparoscopic gastric sleeve resection N/A 03/19/2015    Procedure: LAPAROSCOPIC GASTRIC SLEEVE RESECTION;  Surgeon: Everette RankMichael A Tyner, MD;  Location: [facility] ORS;  Service: General;  Laterality: N/A;    Current Outpatient Rx  Name  Route  Sig  Dispense  Refill  . enoxaparin (LOVENOX) 30 MG/0.3ML injection   Subcutaneous   Inject 0.3 mLs (30 mg total) into the skin every 12 (twelve) hours.   0 Syringe      . HYDROcodone-acetaminophen (HYCET) 7.5-325 mg/15 ml solution   Oral   Take 5-10 mLs by mouth every 4 (four) hours as  needed for moderate pain or severe pain.   120 mL   0   . Insulin Glargine (LANTUS SOLOSTAR) 100 UNIT/ML Solostar Pen      INJECT 45 UNITS INTO THE SKIN 2 TIMES DAILY.   30 mL   1   . lisinopril (PRINIVIL,ZESTRIL) 10 MG tablet      TAKE 1 TABLET BY MOUTH ONCE DAILY   30 tablet   5   . cephALEXin (KEFLEX) 500 MG capsule   Oral   Take 1 capsule (500 mg total) by mouth 2 (two) times daily.   14 capsule   0     Allergies Tomato  Family History  Problem Relation Age of Onset  . Diabetes Father   . Hypertension Father   . Hyperlipidemia Father   . Hypertension Sister   . Birth defects Sister     fluid on brain cause blindness in B eye    Social History Social History  Substance Use Topics  . Smoking status: Never Smoker   . Smokeless tobacco: Never Used  . Alcohol Use: No     Comment: occassionally , rare    Review of Systems  Constitutional: Negative for fever. Eyes: Negative for visual changes. ENT: Negative for sore throat. Cardiovascular: Negative for chest pain. Respiratory: Negative for shortness of breath. Gastrointestinal: Negative for diarrhea. Genitourinary: Positive for dysuria. Musculoskeletal: Negative for back pain. Skin: Negative for rash. Neurological:  Negative for headache. 10 point Review of Systems otherwise negative ____________________________________________   PHYSICAL EXAM:  VITAL SIGNS: ED Triage Vitals  Enc Vitals Group     BP 04/05/15 0850 137/90 mmHg     Pulse Rate 04/05/15 0850 88     Resp 04/05/15 0850 18     Temp 04/05/15 0850 98.3 F (36.8 C)     Temp Source 04/05/15 0850 Oral     SpO2 04/05/15 0850 98 %     Weight 04/05/15 0850 248 lb (112.492 kg)     Height 04/05/15 0850  (1.676 m)     Head Cir --      Peak Flow --      Pain Score 04/05/15 0853 7     Pain Loc --      Pain Edu? --      Excl. in GC? --      Constitutional: Alert and oriented. Well appearing and in no distress. Eyes: Conjunctivae are  normal. PERRL. Normal extraocular movements. ENT   Head: Normocephalic and atraumatic.   Nose: No congestion/rhinnorhea.   Mouth/Throat: Mucous membranes are moist.   Neck: No stridor. Cardiovascular/Chest: Normal rate, regular rhythm.  No murmurs, rubs, or gallops. Respiratory: Normal respiratory effort without tachypnea nor retractions. Breath sounds are clear and equal bilaterally. No wheezes/rales/rhonchi. Gastrointestinal: Soft. No distention, no guarding, no rebound. Moderate tenderness palpation in epigastrium and right upper quadrant.  Genitourinary/rectal:Deferred Musculoskeletal: Nontender with normal range of motion in all extremities. No joint effusions.  No lower extremity tenderness.  No edema. Neurologic:  Normal speech and language. No gross or focal neurologic deficits are appreciated. Skin:  Skin is warm, dry and intact. No rash noted. Psychiatric: Mood and affect are normal. Speech and behavior are normal. Patient exhibits appropriate insight and judgment.  ____________________________________________   EKG I, Governor Rooks, MD, the attending physician have personally viewed and interpreted all ECGs.  70 bpm. Normal sinus rhythm. Narrow QRS. Normal axis. Nonspecific ST and T-wave. ____________________________________________  LABS (pertinent positives/negatives)  Urine pregnancy test negative Confidence of metabolic panel without significant abnormality other than potassium 3.0 CBC shows a normal white blood cell count with no left shift of 6.3. Hemoglobin 12.6 and platelet count 292 Lipase 21 Urinalysis positive ketones, 2+ leukocytes, 6-30 red blood cells, too numerous to count white blood cells and rare bacteria.  ____________________________________________  RADIOLOGY All Xrays were viewed by me. Imaging interpreted by Radiologist.  CT and pelvis contrast:  IMPRESSION: 1. No evidence bowel obstruction. 2. Postsurgical changes from gastric  sleeve procedure. No adjacent focal fluid collection to suggest an abscess. No evidence of bowel perforation. 3. Hepatic steatosis. __________________________________________  PROCEDURES  Procedure(s) performed: None  Critical Care performed: None  ____________________________________________   ED COURSE / ASSESSMENT AND PLAN  CONSULTATIONS: None  Pertinent labs & imaging results that were available during my care of the patient were reviewed by me and considered in my medical decision making (see chart for details).  I'm somewhat concerned about possibly for complication after the recent gastric sleeve. I discussed with patient obtaining CT scan for further evaluation.  She does describe dysuria, or urine sample is consistent with urinary tract infection. However I don't think that her symptoms are simply due to how nephritis clinically, and so I think a CT scan is necessary to rule out complication postsurgically.  Patient was treated for the urinary tract infection with Rocephin here emergently. She'll be discharged with Keflex. 8 culture was sent.  CT scan  showed no intra-abdominal emergency including no evidence of postsurgical complication such as perforation or abscess.  The patient had been in contact with the nurse for Dr. Alva Garnet recently who had suggested that she should continue on with the current management including liquid diet and frequent protein. I did attempt to contact Dr. Alva Garnet, however did not receive a call back. Possibly the patient is stable, I will go ahead and discharge her and she can attempt to get in touch with the office for any additional symptomatic treatment of the nausea.  Patient / Family / Caregiver informed of clinical course, medical decision-making process, and agree with plan.   I discussed return precautions, follow-up instructions, and discharged instructions with patient and/or  family.  ___________________________________________   FINAL CLINICAL IMPRESSION(S) / ED DIAGNOSES   Final diagnoses:  Urinary tract infection with hematuria, site unspecified  Non-intractable vomiting with nausea, vomiting of unspecified type  Hypokalemia       Governor Rooks, MD 04/05/15 1331  Addendum: Dr. Alva Garnet called back at 2:10 PM, and I discussed this case with him. I read the CT report to him. He will follow up with the patient.  Governor Rooks, MD 04/05/15 [phone]

## 2015-04-06 LAB — URINE CULTURE

## 2015-04-22 ENCOUNTER — Telehealth: Payer: Self-pay | Admitting: Family Medicine

## 2015-04-22 NOTE — Telephone Encounter (Signed)
Pt called to confirm her appt time on 05/07/15. She also wanted to let Dr. Ermalene SearingBedsole know that she got the gastric sleeve on her stomach and has not been on any of the bp meds since the operation. You can reach her at [phone] if needed.

## 2015-04-23 NOTE — Telephone Encounter (Signed)
Noted  

## 2015-05-07 ENCOUNTER — Encounter: Payer: Self-pay | Admitting: Family Medicine

## 2015-05-07 ENCOUNTER — Ambulatory Visit (INDEPENDENT_AMBULATORY_CARE_PROVIDER_SITE_OTHER): Payer: 59 | Admitting: Family Medicine

## 2015-05-07 VITALS — BP 120/80 | HR 67 | Temp 97.9°F | Ht 66.75 in | Wt 242.5 lb

## 2015-05-07 DIAGNOSIS — K219 Gastro-esophageal reflux disease without esophagitis: Secondary | ICD-10-CM | POA: Insufficient documentation

## 2015-05-07 DIAGNOSIS — E119 Type 2 diabetes mellitus without complications: Secondary | ICD-10-CM

## 2015-05-07 DIAGNOSIS — I10 Essential (primary) hypertension: Secondary | ICD-10-CM

## 2015-05-07 DIAGNOSIS — Z9884 Bariatric surgery status: Secondary | ICD-10-CM

## 2015-05-07 LAB — LIPID PANEL
CHOL/HDL RATIO: 4
CHOLESTEROL: 124 mg/dL (ref 0–200)
HDL: 30.2 mg/dL — ABNORMAL LOW (ref >39.00)
LDL CALC: 78 mg/dL (ref 0–99)
NonHDL: 93.47
Triglycerides: 77 mg/dL (ref 0.0–149.0)
VLDL: 15.4 mg/dL (ref 0.0–40.0)

## 2015-05-07 LAB — COMPREHENSIVE METABOLIC PANEL
ALBUMIN: 4 g/dL (ref 3.5–5.2)
ALT: 21 U/L (ref 0–35)
AST: 18 U/L (ref 0–37)
Alkaline Phosphatase: 51 U/L (ref 39–117)
BUN: 5 mg/dL — ABNORMAL LOW (ref 6–23)
CALCIUM: 9.5 mg/dL (ref 8.4–10.5)
CHLORIDE: 106 meq/L (ref 96–112)
CO2: 27 mEq/L (ref 19–32)
CREATININE: 0.64 mg/dL (ref 0.40–1.20)
GFR: 143.08 mL/min (ref >60.00)
Glucose, Bld: 113 mg/dL — ABNORMAL HIGH (ref 70–99)
Potassium: 3.4 mEq/L — ABNORMAL LOW (ref 3.5–5.1)
Sodium: 141 mEq/L (ref 135–145)
Total Bilirubin: 0.4 mg/dL (ref 0.2–1.2)
Total Protein: 7.1 g/dL (ref 6.0–8.3)

## 2015-05-07 LAB — HEMOGLOBIN A1C: Hgb A1c MFr Bld: 8 % — ABNORMAL HIGH (ref 4.6–6.5)

## 2015-05-07 LAB — HM DIABETES FOOT EXAM

## 2015-05-07 NOTE — Assessment & Plan Note (Signed)
Encouraged exercise, weight loss, healthy eating habits. ? ?

## 2015-05-07 NOTE — Addendum Note (Signed)
Addended by: Alvina ChouWALSH, Stanlee Roehrig J on: 05/07/2015 08:56 AM   Modules accepted: Orders

## 2015-05-07 NOTE — Assessment & Plan Note (Signed)
Wonderful improvement off insulin  After bariatric surgery. Due for A1C check today.

## 2015-05-07 NOTE — Assessment & Plan Note (Signed)
Frequent episodes.[name]. cahnge to PPI daily. If not controlled call for change to different PPI.

## 2015-05-07 NOTE — Progress Notes (Signed)
Subjective:    Patient ID: [name], female    DOB: 05-15-1986, 29 y.o.   MRN: [id number]  HPI 29 year old female presents for DM follow up 2  month out from bariatric surgery.   She is doing well over all except has had a lot of heartburn. She is using zantac every few days.  Per pt..she has lost 46 lbs so far in last 2 month. Wt Readings from Last 3 Encounters:  05/07/15 242 lb 8 oz (109.997 kg)  04/05/15 248 lb (112.492 kg)  03/19/15 265 lb (120.203 kg)  Body mass index is 38.29 kg/(m^2).    Diabetes:   She is due for repeat A1C now off any meds since surgery. Lab Results  Component Value Date   HGBA1C 14.3* 11/26/2014  Using medications without difficulties: None Hypoglycemic episodes: None  Hyperglycemic episodes:None Feet problems: none Blood Sugars averaging: FBS 85-121 eye exam within last year:  She has advanced to soft foods   Hypertension:  Well controlled on  Lisinopril 10 mg   BP Readings from Last 3 Encounters:  05/07/15 120/80  04/05/15 124/86  03/20/15 155/102  Using medication without problems or lightheadedness: None Chest pain with exertion:None Edema:None Short of breath:None Average home BPs: not checking Other issues:   Due for recheck cholesterol.    Review of Systems  Constitutional: Negative for fever and fatigue.  HENT: Negative for ear pain.   Eyes: Negative for pain.  Respiratory: Negative for chest tightness and shortness of breath.   Cardiovascular: Negative for chest pain, palpitations and leg swelling.  Gastrointestinal: Negative for abdominal pain.  Genitourinary: Negative for dysuria.       Objective:   Physical Exam  Constitutional: Vital signs are normal. She appears well-developed and well-nourished. She is cooperative.  Non-toxic appearance. She does not appear ill. No distress.  HENT:  Head: Normocephalic.  Right Ear: Hearing, tympanic membrane, external ear and ear canal normal. Tympanic membrane is not  erythematous, not retracted and not bulging.  Left Ear: Hearing, tympanic membrane, external ear and ear canal normal. Tympanic membrane is not erythematous, not retracted and not bulging.  Nose: No mucosal edema or rhinorrhea. Right sinus exhibits no maxillary sinus tenderness and no frontal sinus tenderness. Left sinus exhibits no maxillary sinus tenderness and no frontal sinus tenderness.  Mouth/Throat: Uvula is midline, oropharynx is clear and moist and mucous membranes are normal.  Eyes: Conjunctivae, EOM and lids are normal. Pupils are equal, round, and reactive to light. Lids are everted and swept, no foreign bodies found.  Neck: Trachea normal and normal range of motion. Neck supple. Carotid bruit is not present. No thyroid mass and no thyromegaly present.  Cardiovascular: Normal rate, regular rhythm, S1 normal, S2 normal, normal heart sounds, intact distal pulses and normal pulses.  Exam reveals no gallop and no friction rub.   No murmur heard. Pulmonary/Chest: Effort normal and breath sounds normal. No tachypnea. No respiratory distress. She has no decreased breath sounds. She has no wheezes. She has no rhonchi. She has no rales.  Abdominal: Soft. Normal appearance and bowel sounds are normal. There is no tenderness.  Neurological: She is alert.  Skin: Skin is warm, dry and intact. No rash noted.  Psychiatric: Her speech is normal and behavior is normal. Judgment and thought content normal. Her mood appears not anxious. Cognition and memory are normal. She does not exhibit a depressed mood.    Diabetic foot exam: Normal inspection except dry skin  Bilaterally.  No skin breakdown No calluses  Normal DP pulses Normal sensation to light touch and monofilament Nails normal      Assessment & Plan:

## 2015-05-07 NOTE — Patient Instructions (Addendum)
Take omeprazole 40 mg daily to prevent heartburn x 2-4 weeks. Call if not controlling symptoms.  Stop at lab on way out.  Can use Cetaphil cream on feet as moisturizer.  Great work on weight loss and sugar control!!

## 2015-05-07 NOTE — Assessment & Plan Note (Signed)
Counseled pt on being cognizant of sugar as she adds back things to her diet.

## 2015-05-07 NOTE — Progress Notes (Signed)
Pre visit review using our clinic review tool, if applicable. No additional management support is needed unless otherwise documented below in the visit note. 

## 2015-05-13 ENCOUNTER — Encounter: Payer: Self-pay | Admitting: *Deleted

## 2015-05-27 ENCOUNTER — Ambulatory Visit: Payer: Self-pay | Admitting: Physician Assistant

## 2015-05-27 ENCOUNTER — Encounter: Payer: Self-pay | Admitting: Physician Assistant

## 2015-05-27 VITALS — BP 138/100 | HR 76 | Temp 98.1°F

## 2015-05-27 DIAGNOSIS — M62838 Other muscle spasm: Secondary | ICD-10-CM

## 2015-05-27 MED ORDER — METHYLPREDNISOLONE 4 MG PO TBPK: ORAL_TABLET | ORAL | Status: DC

## 2015-05-27 MED ORDER — ETONOGESTREL 68 MG ~~LOC~~ IMPL: 1.0000 | DRUG_IMPLANT | Freq: Once | SUBCUTANEOUS | Status: DC

## 2015-05-27 MED ORDER — CYCLOBENZAPRINE HCL 10 MG PO TABS
10.0000 mg | ORAL_TABLET | Freq: Three times a day (TID) | ORAL | Status: DC | PRN
Start: 2015-05-27 — End: 2015-10-22

## 2015-05-27 NOTE — Progress Notes (Signed)
S: c/o neck pain for 4 days, no fever/chills, increased pain with movement, can't move forward and back, no numbness or tingling, no known injury just awoke with pain  O: vitals wnl, nad, cspine nontender, trap muscles spasmed b/l, decreased rom with forward flexion and hyperextension, grips = b/l, n/v intact, lungs c t a, cv rrr  A: muscle spasms, neck pain  P: medrol dose pack, flexeril 10mg , wet heat followed by ice, cautioned pt on use of nsaids after gastric bypass surgery as had been taking asa, ibuprofen, and aleve

## 2015-06-19 DIAGNOSIS — K912 Postsurgical malabsorption, not elsewhere classified: Secondary | ICD-10-CM | POA: Diagnosis not present

## 2015-06-19 DIAGNOSIS — Z9884 Bariatric surgery status: Secondary | ICD-10-CM | POA: Diagnosis not present

## 2015-08-15 ENCOUNTER — Encounter: Payer: Self-pay | Admitting: Physician Assistant

## 2015-08-15 ENCOUNTER — Ambulatory Visit: Payer: Self-pay | Admitting: Registered Nurse

## 2015-08-15 VITALS — BP 130/90 | HR 76 | Temp 98.2°F

## 2015-08-15 DIAGNOSIS — H6593 Unspecified nonsuppurative otitis media, bilateral: Secondary | ICD-10-CM

## 2015-08-15 DIAGNOSIS — J0100 Acute maxillary sinusitis, unspecified: Secondary | ICD-10-CM

## 2015-08-15 DIAGNOSIS — J029 Acute pharyngitis, unspecified: Secondary | ICD-10-CM

## 2015-08-15 MED ORDER — ACETAMINOPHEN 500 MG PO TABS: 1000.0000 mg | ORAL_TABLET | Freq: Four times a day (QID) | ORAL | Status: AC | PRN

## 2015-08-15 MED ORDER — DOXYCYCLINE HYCLATE 100 MG PO TABS: 100.0000 mg | ORAL_TABLET | Freq: Two times a day (BID) | ORAL | Status: DC

## 2015-08-15 MED ORDER — SALINE SPRAY 0.65 % NA SOLN: 2.0000 | NASAL | Status: DC | PRN

## 2015-08-15 MED ORDER — FLUTICASONE PROPIONATE 50 MCG/ACT NA SUSP: 1.0000 | Freq: Two times a day (BID) | NASAL | Status: DC

## 2015-08-15 NOTE — Progress Notes (Signed)
Subjective:    Patient ID: [name], female    DOB: August 08, 1985, 30 y.o.   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  Denied fever.  Has had cough, runny nose for one week.  Hoarse voice, facial pressure, headache, rhinitis yellow, sore throat x 2 days.  Denied seasonal allergies.  Sore Throat  This is a new problem. The current episode started in the past 7 days. The problem has been unchanged. Neither side of throat is experiencing more pain than the other. There has been no fever. The pain is at a severity of 4/10. The pain is mild. Associated symptoms include congestion, coughing and headaches. Pertinent negatives include no abdominal pain, diarrhea, drooling, ear discharge, ear pain, hoarse voice, plugged ear sensation, neck pain, shortness of breath, stridor, swollen glands, trouble swallowing or vomiting. She has had exposure to strep. She has had no exposure to mono. She has tried acetaminophen and cool liquids for the symptoms. The treatment provided no relief.      Review of Systems  Constitutional: Negative for fever, chills, diaphoresis, activity change, appetite change, fatigue and unexpected weight change.  HENT: Positive for congestion, postnasal drip, rhinorrhea, sinus pressure, sore throat and voice change. Negative for dental problem, drooling, ear discharge, ear pain, facial swelling, hearing loss, hoarse voice, mouth sores, nosebleeds, sneezing, tinnitus and trouble swallowing.   Eyes: Negative for photophobia, pain, discharge, redness, itching and visual disturbance.  Respiratory: Positive for cough. Negative for choking, chest tightness, shortness of breath, wheezing and stridor.   Cardiovascular: Negative for chest pain, palpitations and leg swelling.  Gastrointestinal: Negative for nausea, vomiting, abdominal pain, diarrhea, constipation, blood in stool and abdominal distention.  Endocrine:  Negative for cold intolerance and heat intolerance.  Genitourinary: Negative for dysuria, hematuria and difficulty urinating.  Musculoskeletal: Negative for myalgias, back pain, joint swelling, arthralgias, gait problem, neck pain and neck stiffness.  Skin: Negative for color change, pallor, rash and wound.  Allergic/Immunologic: Positive for environmental allergies. Negative for food allergies.  Neurological: Positive for headaches. Negative for dizziness, tremors, seizures, syncope, facial asymmetry, speech difficulty, weakness, light-headedness and numbness.  Hematological: Negative for adenopathy. Does not bruise/bleed easily.  Psychiatric/Behavioral: Negative for behavioral problems, confusion, sleep disturbance and agitation.       Objective:   Physical Exam  Constitutional: She is oriented to person, place, and time. She appears well-developed and well-nourished. She is active and cooperative.  Non-toxic appearance. She does not have a sickly appearance. She appears ill. No distress.  HENT:  Head: Normocephalic and atraumatic.  Right Ear: Hearing, external ear and ear canal normal. A middle ear effusion is present.  Left Ear: Hearing, external ear and ear canal normal. A middle ear effusion is present.  Nose: Mucosal edema and rhinorrhea present. No nose lacerations, sinus tenderness, nasal deformity, septal deviation or nasal septal hematoma. No epistaxis.  No foreign bodies. Right sinus exhibits maxillary sinus tenderness and frontal sinus tenderness. Left sinus exhibits maxillary sinus tenderness and frontal sinus tenderness.  Mouth/Throat: Uvula is midline and mucous membranes are normal. Mucous membranes are not pale, not dry and not cyanotic. She does not have dentures. No oral lesions. No trismus in the jaw. Normal dentition. No dental abscesses, uvula swelling, lacerations or dental caries. Posterior oropharyngeal edema and posterior oropharyngeal erythema present. No oropharyngeal  exudate or tonsillar abscesses.  Bilateral nasal turbinates with edema/erythema yellow discharge; cobblestoning posterior pharynx; bilateral TMs with air  fluid level left opacity right clear; nasal congestion maxillary greater than frontal sinus TTP  Eyes: Conjunctivae, EOM and lids are normal. Pupils are equal, round, and reactive to light. Right eye exhibits no chemosis, no discharge, no exudate and no hordeolum. No foreign body present in the right eye. Left eye exhibits no chemosis, no discharge, no exudate and no hordeolum. No foreign body present in the left eye. Right conjunctiva is not injected. Right conjunctiva has no hemorrhage. Left conjunctiva is not injected. Left conjunctiva has no hemorrhage. No scleral icterus. Right eye exhibits normal extraocular motion and no nystagmus. Left eye exhibits normal extraocular motion and no nystagmus. Right pupil is round and reactive. Left pupil is round and reactive. Pupils are equal.  Neck: Trachea normal and normal range of motion. Neck supple. No tracheal tenderness, no spinous process tenderness and no muscular tenderness present. No rigidity. No tracheal deviation, no edema, no erythema and normal range of motion present. No thyroid mass and no thyromegaly present.  Cardiovascular: Normal rate, regular rhythm, S1 normal, S2 normal, normal heart sounds and intact distal pulses.  PMI is not displaced.  Exam reveals no gallop and no friction rub.   No murmur heard. Pulmonary/Chest: Effort normal and breath sounds normal. No accessory muscle usage or stridor. No respiratory distress. She has no decreased breath sounds. She has no wheezes. She has no rhonchi. She has no rales. She exhibits no tenderness.  Abdominal: Soft. She exhibits no distension.  Musculoskeletal: Normal range of motion. She exhibits no edema or tenderness.       Right shoulder: Normal.       Left shoulder: Normal.       Right hip: Normal.       Left hip: Normal.       Right knee:  Normal.       Left knee: Normal.       Cervical back: Normal.       Right hand: Normal.       Left hand: Normal.  Lymphadenopathy:       Head (right side): No submental, no submandibular, no tonsillar, no preauricular, no posterior auricular and no occipital adenopathy present.       Head (left side): No submental, no submandibular, no tonsillar, no preauricular, no posterior auricular and no occipital adenopathy present.    She has no cervical adenopathy.       Right cervical: No superficial cervical, no deep cervical and no posterior cervical adenopathy present.      Left cervical: No superficial cervical, no deep cervical and no posterior cervical adenopathy present.  Neurological: She is alert and oriented to person, place, and time. She has normal strength. She is not disoriented. She displays no atrophy and no tremor. No cranial nerve deficit or sensory deficit. She exhibits normal muscle tone. She displays no seizure activity. Coordination and gait normal. GCS eye subscore is 4. GCS verbal subscore is 5. GCS motor subscore is 6.  Skin: Skin is warm, dry and intact. No abrasion, no bruising, no burn, no ecchymosis, no laceration, no lesion, no petechiae and no rash noted. She is not diaphoretic. No cyanosis or erythema. No pallor. Nails show no clubbing.  Psychiatric: She has a normal mood and affect. Her speech is normal and behavior is normal. Judgment and thought content normal. Cognition and memory are normal.  Nursing note and vitals reviewed.         Assessment & Plan:  A-maxillary sinusitis acute, bilateral otitis media with  effusion, acute pharyngitis P-Supportive treatment.   No evidence of invasive bacterial infection, non toxic and well hydrated.  This is most likely self limiting viral infection.  I do not see where any further testing or imaging is necessary at this time.   I will suggest supportive care, rest, good hygiene and encourage the patient to take adequate fluids.   The patient is to return to clinic or EMERGENCY ROOM if symptoms worsen or change significantly e.g. ear pain, fever, purulent discharge from ears or bleeding.  Patient verbalized agreement and understanding of treatment plan.    Work excuse x 24 hours due to facial pain/fatigue/cough.  Start flonase 1 spray each nostril BID, saline 2 sprays each nostril q2h prn congestion.  If no improvement with 48 hours of saline and flonase use start doxycycline  po BID x 10 days.  Rx given. Augmentin and ceftin not prescribed due to interactions with nexplanon and prilosec.  Tylenol  po QID prn pain/fever. No evidence of systemic bacterial infection, non toxic and well hydrated.  I do not see where any further testing or imaging is necessary at this time.   I will suggest supportive care, rest, good hygiene and encourage the patient to take adequate fluids.  The patient is to return to clinic or EMERGENCY ROOM if symptoms worsen or change significantly.  Patient verbalized agreement and understanding of treatment plan and had no further questions at this time.   P2:  Hand washing and cover cough  School/work excuse note given to patient for 24 hours.  Usually no specific medical treatment is needed if a virus is causing the sore throat.  The throat most often gets better on its own within 5 to 7 days.  Antibiotic medicine does not cure viral pharyngitis.   For acute pharyngitis caused by bacteria, your healthcare provider will prescribe an antibiotic.  Marland Kitchen Do not smoke.  Marland Kitchen Avoid secondhand smoke and other air pollutants.  . Use a cool mist humidifier to add moisture to the air.  . Get plenty of rest.  . You may want to rest your throat by talking less and eating a diet that is mostly liquid or soft for a day or two.   Marland Kitchen Nonprescription throat lozenges and mouthwashes should help relieve the soreness.   . Gargling with warm saltwater and drinking warm liquids may help.  (You can make a saltwater solution by  adding 1/4 teaspoon of salt to 8 ounces, or 240 mL, of warm water.)  . A nonprescription pain reliever such as aspirin, acetaminophen, or ibuprofen may ease general aches and pains.   FOLLOW UP with clinic provider if no improvements in the next 7-10 days.  Patient verbalized understanding of instructions and agreed with plan of care. P2:  Hand washing and diet.

## 2015-08-28 DIAGNOSIS — Z01419 Encounter for gynecological examination (general) (routine) without abnormal findings: Secondary | ICD-10-CM | POA: Diagnosis not present

## 2015-08-28 DIAGNOSIS — I10 Essential (primary) hypertension: Secondary | ICD-10-CM | POA: Diagnosis not present

## 2015-08-28 DIAGNOSIS — Z6836 Body mass index (BMI) 36.0-36.9, adult: Secondary | ICD-10-CM | POA: Diagnosis not present

## 2015-08-28 DIAGNOSIS — Z01411 Encounter for gynecological examination (general) (routine) with abnormal findings: Secondary | ICD-10-CM | POA: Diagnosis not present

## 2015-08-28 DIAGNOSIS — Z9889 Other specified postprocedural states: Secondary | ICD-10-CM | POA: Diagnosis not present

## 2015-08-28 DIAGNOSIS — E119 Type 2 diabetes mellitus without complications: Secondary | ICD-10-CM | POA: Diagnosis not present

## 2015-08-28 LAB — HM PAP SMEAR: HM Pap smear: NEGATIVE

## 2015-08-30 ENCOUNTER — Ambulatory Visit (INDEPENDENT_AMBULATORY_CARE_PROVIDER_SITE_OTHER): Payer: 59 | Admitting: Obstetrics & Gynecology

## 2015-08-30 ENCOUNTER — Encounter: Payer: Self-pay | Admitting: Obstetrics & Gynecology

## 2015-08-30 VITALS — BP 128/88 | HR 82 | Ht 66.0 in | Wt 224.0 lb

## 2015-08-30 DIAGNOSIS — Z30017 Encounter for initial prescription of implantable subdermal contraceptive: Secondary | ICD-10-CM | POA: Diagnosis not present

## 2015-08-30 DIAGNOSIS — Z3046 Encounter for surveillance of implantable subdermal contraceptive: Secondary | ICD-10-CM | POA: Diagnosis not present

## 2015-08-30 DIAGNOSIS — Z01812 Encounter for preprocedural laboratory examination: Secondary | ICD-10-CM

## 2015-08-30 LAB — POCT URINE PREGNANCY: Preg Test, Ur: NEGATIVE

## 2015-08-30 MED ORDER — ETONOGESTREL 68 MG ~~LOC~~ IMPL
68.0000 mg | DRUG_IMPLANT | Freq: Once | SUBCUTANEOUS | Status: AC
  Administered 2015-08-30: 68 mg via SUBCUTANEOUS

## 2015-08-30 NOTE — Progress Notes (Signed)
     GYNECOLOGY CLINIC PROCEDURE NOTE  [name] is a 30 y.o. F here for Nexplanon removal and re-insertion.  [facility] was in 2016 [facility] and was normal.  No other gynecologic concerns.   Nexplanon Removal and Insertion  Patient identified, informed consent performed, consent signed.   Patient does understand that irregular bleeding is a very common side effect of this medication. She was advised to have backup contraception for one week after replacement of the implant. Pregnancy test in clinic today was negative.  Appropriate time out taken. Implanon site identified. Area prepped in usual sterile fashon. One ml of 1% lidocaine was used to anesthetize the area at the distal end of the implant. A small stab incision was made right beside the implant on the distal portion. The Nexplanon rod was grasped using hemostats and removed without difficulty. There was minimal blood loss. There were no complications. Area was then injected with 3 ml of 1 % lidocaine. She was re-prepped with betadine, Nexplanon removed from packaging, Device confirmed in needle, then inserted full length of needle and withdrawn per handbook instructions. Nexplanon was able to palpated in the patient's arm; patient palpated the insert herself.  There was minimal blood loss. Patient insertion site covered with guaze and a pressure bandage to reduce any bruising. The patient tolerated the procedure well and was given post procedure instructions.  She was advised to have backup contraception for one week.     [name]  Cassidy Varone, MD, FACOG Attending Obstetrician & Gynecologist, [facility]

## 2015-08-30 NOTE — Patient Instructions (Signed)
Return to clinic for any scheduled appointments or for any gynecologic concerns as needed.   

## 2015-08-30 NOTE — Progress Notes (Signed)
Here today for Nexplanon replacement.  Her current Nexplanon expired in January.

## 2015-09-02 ENCOUNTER — Encounter: Payer: Self-pay | Admitting: *Deleted

## 2015-09-18 DIAGNOSIS — Z9884 Bariatric surgery status: Secondary | ICD-10-CM | POA: Diagnosis not present

## 2015-09-18 DIAGNOSIS — K219 Gastro-esophageal reflux disease without esophagitis: Secondary | ICD-10-CM | POA: Diagnosis not present

## 2015-09-30 ENCOUNTER — Other Ambulatory Visit: Payer: Self-pay | Admitting: Family Medicine

## 2015-09-30 NOTE — Telephone Encounter (Signed)
Pt request status of lisinopril refill; advised pt already ref[facility] to [facility] pt pharmacy; pt voiced understanding.

## 2015-10-08 ENCOUNTER — Telehealth: Payer: Self-pay | Admitting: Family Medicine

## 2015-10-08 DIAGNOSIS — E119 Type 2 diabetes mellitus without complications: Secondary | ICD-10-CM

## 2015-10-08 NOTE — Telephone Encounter (Signed)
-----   Message from Baldomero LamyNatasha C Chavers sent at 10/03/2015 10:46 AM EDT ----- Regarding: Cpx labs Wed 4/26, need orders. Thanks! :-) Please order  future cpx labs for pt's upcoming lab appt. Thanks Rodney Boozeasha

## 2015-10-09 ENCOUNTER — Other Ambulatory Visit (INDEPENDENT_AMBULATORY_CARE_PROVIDER_SITE_OTHER): Payer: 59

## 2015-10-09 DIAGNOSIS — E119 Type 2 diabetes mellitus without complications: Secondary | ICD-10-CM | POA: Diagnosis not present

## 2015-10-09 LAB — LIPID PANEL
Cholesterol: 132 mg/dL (ref 0–200)
HDL: 34 mg/dL — AB (ref >39.00)
LDL Cholesterol: 81 mg/dL (ref 0–99)
NONHDL: 97.75
TRIGLYCERIDES: 85 mg/dL (ref 0.0–149.0)
Total CHOL/HDL Ratio: 4
VLDL: 17 mg/dL (ref 0.0–40.0)

## 2015-10-09 LAB — COMPREHENSIVE METABOLIC PANEL
ALBUMIN: 3.9 g/dL (ref 3.5–5.2)
ALK PHOS: 55 U/L (ref 39–117)
ALT: 10 U/L (ref 0–35)
AST: 10 U/L (ref 0–37)
BILIRUBIN TOTAL: 0.4 mg/dL (ref 0.2–1.2)
BUN: 7 mg/dL (ref 6–23)
CHLORIDE: 106 meq/L (ref 96–112)
CO2: 28 meq/L (ref 19–32)
Calcium: 9.3 mg/dL (ref 8.4–10.5)
Creatinine, Ser: 0.74 mg/dL (ref 0.40–1.20)
GFR: 120.63 mL/min (ref >60.00)
Glucose, Bld: 114 mg/dL — ABNORMAL HIGH (ref 70–99)
Potassium: 3.9 mEq/L (ref 3.5–5.1)
SODIUM: 140 meq/L (ref 135–145)
Total Protein: 7.1 g/dL (ref 6.0–8.3)

## 2015-10-09 LAB — HEMOGLOBIN A1C: HEMOGLOBIN A1C: 6.1 % (ref 4.6–6.5)

## 2015-10-15 ENCOUNTER — Encounter: Payer: 59 | Admitting: Family Medicine

## 2015-10-16 ENCOUNTER — Encounter: Payer: 59 | Admitting: Family Medicine

## 2015-10-21 ENCOUNTER — Encounter: Payer: Self-pay | Admitting: Gastroenterology

## 2015-10-21 ENCOUNTER — Encounter (INDEPENDENT_AMBULATORY_CARE_PROVIDER_SITE_OTHER): Payer: Self-pay

## 2015-10-21 ENCOUNTER — Ambulatory Visit (INDEPENDENT_AMBULATORY_CARE_PROVIDER_SITE_OTHER): Payer: 59 | Admitting: Gastroenterology

## 2015-10-21 ENCOUNTER — Other Ambulatory Visit: Payer: Self-pay

## 2015-10-21 VITALS — BP 146/79 | Temp 98.8°F | Ht 66.75 in | Wt 221.0 lb

## 2015-10-21 DIAGNOSIS — R1115 Cyclical vomiting syndrome unrelated to migraine: Secondary | ICD-10-CM

## 2015-10-21 DIAGNOSIS — K219 Gastro-esophageal reflux disease without esophagitis: Secondary | ICD-10-CM

## 2015-10-21 DIAGNOSIS — G43A0 Cyclical vomiting, not intractable: Secondary | ICD-10-CM | POA: Diagnosis not present

## 2015-10-21 NOTE — Progress Notes (Signed)
Gastroenterology Consultation  Referring Provider:     Excell Seltzer, MD Primary Care Physician:  Kerby Nora, MD Primary Gastroenterologist:  Dr. Servando Snare     Reason for Consultation:     Heartburn        HPI:   [name] is a 30 y.o. y/o female referred for consultation & management of Heartburn by Dr. Kerby Nora, MD.  This patient comes today with a report of heartburn that started shortly after she had a gastric sleeve for weight reduction. The patient has lost 60 pounds with this procedure. The patient states that she has symptoms of regurgitation and heartburn. The patient has tried Tums and omeprazole without any relief of her symptoms for more than a few minutes to an hour. The patient denies any black stools or bloody stools or change in bowel habits but she does report that she has been having some nausea and vomiting. There is no report of any vomiting of any black material or coffee ground emesis. The patient also denies any abdominal pain associated with her heartburn. The patient does report that she has some symptoms of regurgitation when she lays down at night.   Past Medical History  Diagnosis Date  . Hypertension   . Diabetes mellitus without complication (HCC)   . Headache     Past Surgical History  Procedure Laterality Date  . Cesarean section  04/30/2009    decreased fetal heart tones due to umbilical cord wrapped  . Cholecystectomy    . Cesarean section    . Laparoscopic gastric sleeve resection N/A 03/19/2015    Procedure: LAPAROSCOPIC GASTRIC SLEEVE RESECTION;  Surgeon: Everette Rank, MD;  Location: [facility] ORS;  Service: General;  Laterality: N/A;    Prior to Admission medications   Medication Sig Start Date End Date Taking? Authorizing Provider  lisinopril (PRINIVIL,ZESTRIL) 10 MG tablet TAKE 1 TABLET BY MOUTH ONCE DAILY 09/30/15  Yes Amy Michelle Nasuti, MD  omeprazole (PRILOSEC) 40 MG capsule Reported on 08/15/2015 08/08/15  Yes Historical Provider, MD    cyclobenzaprine (FLEXERIL) 10 MG tablet Take 1 tablet (10 mg total) by mouth 3 (three) times daily as needed for muscle spasms. Patient not taking: Reported on 10/21/2015 05/27/15   Faythe Ghee, PA-C  fluticasone [facility]) 50 MCG/ACT nasal spray Place 1 spray into both nostrils 2 (two) times daily. 08/15/15 09/12/15  Barbaraann Barthel, NP  omeprazole (PRILOSEC) 40 MG capsule Take 40 mg by mouth daily.  04/25/15 07/24/15  Historical Provider, MD    Family History  Problem Relation Age of Onset  . Diabetes Father   . Hypertension Father   . Hyperlipidemia Father   . Hypertension Sister   . Birth defects Sister     fluid on brain cause blindness in B eye     Social History  Substance Use Topics  . Smoking status: Never Smoker   . Smokeless tobacco: Never Used  . Alcohol Use: No     Comment: occassionally , rare    Allergies as of 10/21/2015 - Review Complete 10/21/2015  Allergen Reaction Noted  . Tomato Rash 03/13/2015    Review of Systems:    All systems reviewed and negative except where noted in HPI.   Physical Exam:  BP 146/79 mmHg  Temp(Src) 98.8 F (37.1 C) (Oral)  Ht 5' 6.75" (1.695 m)  Wt 221 lb (100.245 kg)  BMI 34.89 kg/m2 No LMP recorded. Psych:  Alert and cooperative. Normal mood and affect. General:  Alert,  Well-developed, well-nourished, pleasant and cooperative in NAD Head:  Normocephalic and atraumatic. Eyes:  Sclera clear, no icterus.   Conjunctiva pink. Ears:  Normal auditory acuity. Nose:  No deformity, discharge, or lesions. Mouth:  No deformity or lesions,oropharynx pink & moist. Neck:  Supple; no masses or thyromegaly. Lungs:  Respirations even and unlabored.  Clear throughout to auscultation.   No wheezes, crackles, or rhonchi. No acute distress. Heart:  Regular rate and rhythm; no murmurs, clicks, rubs, or gallops. Abdomen:  Normal bowel sounds.  No bruits.  Soft, non-tender and non-distended without masses, hepatosplenomegaly or hernias noted.   No guarding or rebound tenderness.  Negative Carnett sign.   Rectal:  Deferred.  Msk:  Symmetrical without gross deformities.  Good, equal movement & strength bilaterally. Pulses:  Normal pulses noted. Extremities:  No clubbing or edema.  No cyanosis. Neurologic:  Alert and oriented x3;  grossly normal neurologically. Skin:  Intact without significant lesions or rashes.  No jaundice. Lymph Nodes:  No significant cervical adenopathy. Psych:  Alert and cooperative. Normal mood and affect.  Imaging Studies: No results found.  Assessment and Plan:   [name] is a 30 y.o. y/o female Who comes in today with a new onset of heartburn that is not responsive to omeprazole or Tums. The patient has also tried Zantac without relief. The patient will be given samples of Dexilant to be taken once a day. She will also be set up for an upper endoscopy.I have discussed risks & benefits which include, but are not limited to, bleeding, infection, perforation & drug reaction.  The patient agrees with this plan & written consent will be obtained.     Note: This dictation was prepared with Dragon dictation along with smaller phrase technology. Any transcriptional errors that result from this process are unintentional.

## 2015-10-22 ENCOUNTER — Encounter: Payer: Self-pay | Admitting: *Deleted

## 2015-10-22 NOTE — Discharge Instructions (Signed)

## 2015-10-24 ENCOUNTER — Ambulatory Visit: Payer: 59 | Admitting: Anesthesiology

## 2015-10-24 ENCOUNTER — Encounter
Admission: RE | Disposition: A | Discharge status: Home / Self Care | Payer: Self-pay | Source: Ambulatory Visit | Attending: Gastroenterology

## 2015-10-24 ENCOUNTER — Ambulatory Visit
Admission: RE | Admit: 2015-10-24 | Discharge: 2015-10-24 | Disposition: A | Discharge status: Home / Self Care | Payer: 59 | Source: Ambulatory Visit | Attending: Gastroenterology | Admitting: Gastroenterology

## 2015-10-24 DIAGNOSIS — Z8249 Family history of ischemic heart disease and other diseases of the circulatory system: Secondary | ICD-10-CM | POA: Diagnosis not present

## 2015-10-24 DIAGNOSIS — R1115 Cyclical vomiting syndrome unrelated to migraine: Secondary | ICD-10-CM | POA: Insufficient documentation

## 2015-10-24 DIAGNOSIS — Z833 Family history of diabetes mellitus: Secondary | ICD-10-CM | POA: Insufficient documentation

## 2015-10-24 DIAGNOSIS — I10 Essential (primary) hypertension: Secondary | ICD-10-CM | POA: Diagnosis not present

## 2015-10-24 DIAGNOSIS — K2100 Gastro-esophageal reflux disease with esophagitis, without bleeding: Secondary | ICD-10-CM | POA: Insufficient documentation

## 2015-10-24 DIAGNOSIS — R112 Nausea with vomiting, unspecified: Secondary | ICD-10-CM | POA: Diagnosis not present

## 2015-10-24 DIAGNOSIS — K2950 Unspecified chronic gastritis without bleeding: Secondary | ICD-10-CM | POA: Insufficient documentation

## 2015-10-24 DIAGNOSIS — K449 Diaphragmatic hernia without obstruction or gangrene: Secondary | ICD-10-CM | POA: Insufficient documentation

## 2015-10-24 DIAGNOSIS — E119 Type 2 diabetes mellitus without complications: Secondary | ICD-10-CM | POA: Diagnosis not present

## 2015-10-24 DIAGNOSIS — G43A0 Cyclical vomiting, not intractable: Secondary | ICD-10-CM | POA: Diagnosis not present

## 2015-10-24 DIAGNOSIS — Z9884 Bariatric surgery status: Secondary | ICD-10-CM | POA: Diagnosis not present

## 2015-10-24 DIAGNOSIS — Z79899 Other long term (current) drug therapy: Secondary | ICD-10-CM | POA: Diagnosis not present

## 2015-10-24 DIAGNOSIS — K2930 Chronic superficial gastritis without bleeding: Secondary | ICD-10-CM | POA: Diagnosis not present

## 2015-10-24 DIAGNOSIS — Z9049 Acquired absence of other specified parts of digestive tract: Secondary | ICD-10-CM | POA: Diagnosis not present

## 2015-10-24 DIAGNOSIS — Z8489 Family history of other specified conditions: Secondary | ICD-10-CM | POA: Diagnosis not present

## 2015-10-24 DIAGNOSIS — R1013 Epigastric pain: Secondary | ICD-10-CM | POA: Diagnosis not present

## 2015-10-24 DIAGNOSIS — K219 Gastro-esophageal reflux disease without esophagitis: Secondary | ICD-10-CM | POA: Diagnosis not present

## 2015-10-24 DIAGNOSIS — R0789 Other chest pain: Secondary | ICD-10-CM | POA: Diagnosis not present

## 2015-10-24 HISTORY — DX: Gastro-esophageal reflux disease without esophagitis: K21.9

## 2015-10-24 HISTORY — PX: ESOPHAGOGASTRODUODENOSCOPY (EGD) WITH PROPOFOL: SHX5813

## 2015-10-24 HISTORY — DX: Presence of spectacles and contact lenses: Z97.3

## 2015-10-24 SURGERY — ESOPHAGOGASTRODUODENOSCOPY (EGD) WITH PROPOFOL
Anesthesia: Monitor Anesthesia Care | Wound class: Clean Contaminated

## 2015-10-24 MED ORDER — ACETAMINOPHEN 160 MG/5ML PO SOLN: 325.0000 mg | ORAL | Status: DC | PRN

## 2015-10-24 MED ORDER — ACETAMINOPHEN 325 MG PO TABS: 325.0000 mg | ORAL_TABLET | ORAL | Status: DC | PRN

## 2015-10-24 MED ORDER — ONDANSETRON HCL 4 MG/2ML IJ SOLN
4.0000 mg | Freq: Once | INTRAMUSCULAR | Status: AC
  Administered 2015-10-24: 4 mg via INTRAVENOUS

## 2015-10-24 MED ORDER — PROPOFOL 10 MG/ML IV BOLUS
INTRAVENOUS | Status: DC | PRN
  Administered 2015-10-24 (×4): 20 mg via INTRAVENOUS

## 2015-10-24 MED ORDER — SODIUM CHLORIDE 0.9 % IV SOLN: INTRAVENOUS | Status: DC

## 2015-10-24 MED ORDER — LACTATED RINGERS IV SOLN
INTRAVENOUS | Status: DC
  Administered 2015-10-24 (×2): via INTRAVENOUS

## 2015-10-24 SURGICAL SUPPLY — 31 items
BALLN DILATOR 10-12 8 (BALLOONS)
BALLN DILATOR 12-15 8 (BALLOONS)
BALLN DILATOR 15-18 8 (BALLOONS)
BALLN DILATOR CRE 0-12 8 (BALLOONS)
BALLN DILATOR ESOPH 8 10 CRE (MISCELLANEOUS) IMPLANT
BALLOON DILATOR 12-15 8 (BALLOONS) IMPLANT
BALLOON DILATOR 15-18 8 (BALLOONS) IMPLANT
BALLOON DILATOR CRE 0-12 8 (BALLOONS) IMPLANT
BLOCK BITE 60FR ADLT L/F GRN (MISCELLANEOUS) ×2 IMPLANT
CANISTER SUCT 1200ML W/VALVE (MISCELLANEOUS) ×2 IMPLANT
CLIP HMST 235XBRD CATH ROT (MISCELLANEOUS) IMPLANT
CLIP RESOLUTION 360 11X235 (MISCELLANEOUS)
FCP ESCP3.2XJMB 240X2.8X (MISCELLANEOUS)
FORCEPS BIOP RAD 4 LRG CAP 4 (CUTTING FORCEPS) ×2 IMPLANT
FORCEPS BIOP RJ4 240 W/NDL (MISCELLANEOUS)
FORCEPS ESCP3.2XJMB 240X2.8X (MISCELLANEOUS) IMPLANT
GOWN CVR UNV OPN BCK APRN NK (MISCELLANEOUS) ×2 IMPLANT
GOWN ISOL THUMB LOOP REG UNIV (MISCELLANEOUS) ×2
INJECTOR VARIJECT VIN23 (MISCELLANEOUS) IMPLANT
KIT DEFENDO VALVE AND CONN (KITS) IMPLANT
KIT ENDO PROCEDURE OLY (KITS) ×2 IMPLANT
MARKER SPOT ENDO TATTOO 5ML (MISCELLANEOUS) IMPLANT
PAD GROUND ADULT SPLIT (MISCELLANEOUS) IMPLANT
SNARE SHORT THROW 13M SML OVAL (MISCELLANEOUS) IMPLANT
SNARE SHORT THROW 30M LRG OVAL (MISCELLANEOUS) IMPLANT
SPOT EX ENDOSCOPIC TATTOO (MISCELLANEOUS)
SYR INFLATION 60ML (SYRINGE) IMPLANT
TRAP ETRAP POLY (MISCELLANEOUS) IMPLANT
VARIJECT INJECTOR VIN23 (MISCELLANEOUS)
WATER STERILE IRR 250ML POUR (IV SOLUTION) ×2 IMPLANT
WIRE CRE 18-20MM 8CM F G (MISCELLANEOUS) IMPLANT

## 2015-10-24 NOTE — Anesthesia Preprocedure Evaluation (Signed)
Anesthesia Evaluation  Patient identified by MRN, date of birth, ID band  Reviewed: Allergy & Precautions, H&P , NPO status , Patient's Chart, lab work & pertinent test results  Airway Mallampati: II  TM Distance: >3 FB Neck ROM: full    Dental no notable dental hx.    Pulmonary    Pulmonary exam normal       Cardiovascular hypertension, Rhythm:regular Rate:Normal     Neuro/Psych    GI/Hepatic GERD-  ,  Endo/Other  diabetes  Renal/GU      Musculoskeletal   Abdominal   Peds  Hematology   Anesthesia Other Findings   Reproductive/Obstetrics                             Anesthesia Physical Anesthesia Plan  ASA: II  Anesthesia Plan: MAC   Post-op Pain Management:    Induction:   Airway Management Planned:   Additional Equipment:   Intra-op Plan:   Post-operative Plan:   Informed Consent: I have reviewed the patients History and Physical, chart, labs and discussed the procedure including the risks, benefits and alternatives for the proposed anesthesia with the patient or authorized representative who has indicated his/her understanding and acceptance.     Plan Discussed with: CRNA  Anesthesia Plan Comments:         Anesthesia Quick Evaluation  

## 2015-10-24 NOTE — Anesthesia Postprocedure Evaluation (Signed)
Anesthesia Post Note  [name]  Procedure(s) Performed: Procedure(s) (LRB): ESOPHAGOGASTRODUODENOSCOPY (EGD) WITH PROPOFOL (N/A)  Patient location during evaluation: PACU Anesthesia Type: MAC Level of consciousness: awake and alert and oriented Pain management: satisfactory to patient Vital Signs Assessment: post-procedure vital signs reviewed and stable Respiratory status: spontaneous breathing, nonlabored ventilation and respiratory function stable Cardiovascular status: blood pressure returned to baseline and stable Postop Assessment: Adequate PO intake and No signs of nausea or vomiting Anesthetic complications: no    [name], [name]

## 2015-10-24 NOTE — Transfer of Care (Signed)
Immediate Anesthesia Transfer of Care Note  [name]  Procedure(s) Performed: Procedure(s): ESOPHAGOGASTRODUODENOSCOPY (EGD) WITH PROPOFOL (N/A)  Patient Location: PACU  Anesthesia Type: MAC  Level of Consciousness: awake, alert  and patient cooperative  Airway and Oxygen Therapy: Patient Spontanous Breathing and Patient connected to supplemental oxygen  Post-op Assessment: Post-op Vital signs reviewed, Patient's Cardiovascular Status Stable, Respiratory Function Stable, Patent Airway and No signs of Nausea or vomiting  Post-op Vital Signs: Reviewed and stable  Complications: No apparent anesthesia complications

## 2015-10-24 NOTE — Op Note (Signed)
[facility] Patient Name: [name] Procedure Date: 10/24/2015 9:40 AM MRN: [id number] Account #: [id number] Date of Birth: 1985/07/13 Admit Type: Outpatient Age: 30 Room: [facility] OR ROOM 01 Gender: Female Note Status: Finalized Procedure:            Upper GI endoscopy Indications:          Dyspepsia, Heartburn, Nausea with vomiting Providers:            Midge Minium, MD Referring MD:         Excell Seltzer, MD (Referring MD) Medicines:            Propofol per Anesthesia Complications:        No immediate complications. Procedure:            Pre-Anesthesia Assessment:                       - Prior to the procedure, a History and Physical was                        performed, and patient medications and allergies were                        reviewed. The patient's tolerance of previous                        anesthesia was also reviewed. The risks and benefits of                        the procedure and the sedation options and risks were                        discussed with the patient. All questions were                        answered, and informed consent was obtained. Prior                        Anticoagulants: The patient has taken no previous                        anticoagulant or antiplatelet agents. ASA Grade                        Assessment: II - A patient with mild systemic disease.                        After reviewing the risks and benefits, the patient was                        deemed in satisfactory condition to undergo the                        procedure.                       After obtaining informed consent, the endoscope was                        passed under direct vision. Throughout the procedure,  the patient's blood pressure, pulse, and oxygen                        saturations were monitored continuously. The Olympus                        GIF H180J colonscope (Z#:[id number](S#:[id number]) was introduced                    through the mouth, and advanced to the second part of                        duodenum. The upper GI endoscopy was accomplished                        without difficulty. The patient tolerated the procedure                        well. Findings:      A medium-sized hiatal hernia was present.      LA Grade C (one or more mucosal breaks continuous between tops of 2 or       more mucosal folds, less than 75% circumference) esophagitis with no       bleeding was found in the lower third of the esophagus.      A deformity was found in the gastric body. Biopsies were taken with a       cold forceps for histology.      The examined duodenum was normal. Impression:           - Medium-sized hiatal hernia.                       - LA Grade C reflux esophagitis.                       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 Biopsied.                       - Normal examined duodenum.                       - Gastric sleeve surgery. Recommendation:       - Await pathology results.                       - Use a proton pump inhibitor PO daily. Procedure Code(s):    --- Professional ---                       [phone], Esophagogastroduodenoscopy, flexible, transoral;                        with biopsy, single or multiple Diagnosis Code(s):    --- Professional ---                       R11.2, Nausea with vomiting, unspecified                       K21.0, Gastro-esophageal reflux disease with esophagitis                       R10.13, Epigastric pain CPT copyright 2016 American Medical  Association. All rights reserved. The codes documented in this report are preliminary and upon coder review may  be revised to meet current compliance requirements. Midge Minium, MD 10/24/2015 9:52:00 AM This report has been signed electronically. Number of Addenda: 0 Note Initiated On: 10/24/2015 9:40 AM Total Procedure Duration: 0 hours 2 minutes 51 seconds       [facility]

## 2015-10-24 NOTE — Anesthesia Procedure Notes (Signed)
Procedure Name: MAC Performed by: Raequon Catanzaro Pre-anesthesia Checklist: Patient identified, Emergency Drugs available, Suction available, Timeout performed and Patient being monitored Patient Re-evaluated:Patient Re-evaluated prior to inductionOxygen Delivery Method: Nasal cannula Placement Confirmation: positive ETCO2     

## 2015-10-24 NOTE — H&P (Signed)
  [facility]  [address] [place], [place] [address] Phone: [phone] Fax : [phone]  Primary Care Physician:  Kerby NoraAmy Bedsole, MD Primary Gastroenterologist:  Dr. Servando SnareWohl  Pre-Procedure History & Physical: HPI:  Cassidy ProudLetitia D [name] is a 30 y.o. female is here for an endoscopy.   Past Medical History  Diagnosis Date  . Hypertension   . Diabetes mellitus without complication (HCC)     before gastric sleeve procedure  . GERD (gastroesophageal reflux disease)   . Headache     when BP is up  . Wears contact lenses     Past Surgical History  Procedure Laterality Date  . Cesarean section  04/30/2009    decreased fetal heart tones due to umbilical cord wrapped  . Cholecystectomy    . Cesarean section    . Laparoscopic gastric sleeve resection N/A 03/19/2015    Procedure: LAPAROSCOPIC GASTRIC SLEEVE RESECTION;  Surgeon: Everette RankMichael A Tyner, MD;  Location: [facility] ORS;  Service: General;  Laterality: N/A;    Prior to Admission medications   Medication Sig Start Date End Date Taking? Authorizing Provider  dexlansoprazole (DEXILANT) 60 MG capsule Take 60 mg by mouth daily.   Yes Historical Provider, MD  etonogestrel (NEXPLANON) 68 MG IMPL implant 1 each by Subdermal route once.   Yes Historical Provider, MD  lisinopril (PRINIVIL,ZESTRIL) 10 MG tablet TAKE 1 TABLET BY MOUTH ONCE DAILY 09/30/15  Yes Amy Michelle NasutiE Bedsole, MD    Allergies as of 10/21/2015 - Review Complete 10/21/2015  Allergen Reaction Noted  . Tomato Rash 03/13/2015    Family History  Problem Relation Age of Onset  . Diabetes Father   . Hypertension Father   . Hyperlipidemia Father   . Hypertension Sister   . Birth defects Sister     fluid on brain cause blindness in B eye    Social History   Social History  . Marital Status: Single    Spouse Name: N/A  . Number of Children: N/A  . Years of Education: N/A   Occupational History  . Not on file.   Social History Main Topics  . Smoking status:  Never Smoker   . Smokeless tobacco: Never Used  . Alcohol Use: No     Comment: 1 drink/mo  . Drug Use: No  . Sexual Activity:    Partners: Male    Birth Control/ Protection: Implant     Comment: Nutraplanada   Other Topics Concern  . Not on file   Social History Narrative   Single, on child age 653.    Review of Systems: See HPI, otherwise negative ROS  Physical Exam: Ht 5' 6.75" (1.695 m)  Wt 221 lb (100.245 kg)  BMI 34.89 kg/m2  LMP  General:   Alert,  pleasant and cooperative in NAD Head:  Normocephalic and atraumatic. Neck:  Supple; no masses or thyromegaly. Lungs:  Clear throughout to auscultation.    Heart:  Regular rate and rhythm. Abdomen:  Soft, nontender and nondistended. Normal bowel sounds, without guarding, and without rebound.   Neurologic:  Alert and  oriented x4;  grossly normal neurologically.  Impression/Plan: [name] is here for an endoscopy to be performed for GERD  Risks, benefits, limitations, and alternatives regarding  endoscopy have been reviewed with the patient.  Questions have been answered.  All parties agreeable.   Midge Miniumarren Keyvin Rison, MD  10/24/2015, 9:00 AM

## 2015-10-25 ENCOUNTER — Encounter: Payer: Self-pay | Admitting: Gastroenterology

## 2015-10-25 ENCOUNTER — Encounter: Payer: 59 | Admitting: Family Medicine

## 2015-10-25 DIAGNOSIS — Z0289 Encounter for other administrative examinations: Secondary | ICD-10-CM

## 2015-10-28 ENCOUNTER — Telehealth: Payer: Self-pay | Admitting: Family Medicine

## 2015-10-28 NOTE — Telephone Encounter (Signed)
Patient did not come for their scheduled appointment 5/12 for cpx.  Please let me know if the patient needs to be contacted immediately for follow up or if no follow up is necessary.

## 2015-10-29 NOTE — Telephone Encounter (Signed)
reschedule

## 2015-10-29 NOTE — Telephone Encounter (Signed)
APPOINTMENT 6/1 °

## 2015-10-31 ENCOUNTER — Encounter: Payer: Self-pay | Admitting: Gastroenterology

## 2015-11-14 ENCOUNTER — Ambulatory Visit (INDEPENDENT_AMBULATORY_CARE_PROVIDER_SITE_OTHER): Payer: 59 | Admitting: Family Medicine

## 2015-11-14 DIAGNOSIS — I10 Essential (primary) hypertension: Secondary | ICD-10-CM

## 2015-12-09 ENCOUNTER — Other Ambulatory Visit: Payer: Self-pay

## 2015-12-09 MED ORDER — DEXLANSOPRAZOLE 60 MG PO CPDR: 60.0000 mg | DELAYED_RELEASE_CAPSULE | Freq: Every day | ORAL | Status: DC

## 2016-01-24 NOTE — Progress Notes (Signed)
error 

## 2016-02-26 DIAGNOSIS — R300 Dysuria: Secondary | ICD-10-CM | POA: Diagnosis not present

## 2016-02-26 DIAGNOSIS — N3001 Acute cystitis with hematuria: Secondary | ICD-10-CM | POA: Diagnosis not present

## 2016-02-26 DIAGNOSIS — R350 Frequency of micturition: Secondary | ICD-10-CM | POA: Diagnosis not present

## 2016-03-16 ENCOUNTER — Telehealth: Payer: Self-pay | Admitting: Family Medicine

## 2016-03-16 ENCOUNTER — Other Ambulatory Visit (INDEPENDENT_AMBULATORY_CARE_PROVIDER_SITE_OTHER): Payer: 59

## 2016-03-16 DIAGNOSIS — E119 Type 2 diabetes mellitus without complications: Secondary | ICD-10-CM

## 2016-03-16 NOTE — Telephone Encounter (Signed)
-----   Message from Alvina Chouerri J Walsh sent at 03/16/2016 10:04 AM EDT ----- Regarding: Lab orders needed Pt coming in today for labs

## 2016-03-17 LAB — COMPREHENSIVE METABOLIC PANEL
ALT: 10 U/L (ref 0–35)
AST: 12 U/L (ref 0–37)
Albumin: 3.8 g/dL (ref 3.5–5.2)
Alkaline Phosphatase: 59 U/L (ref 39–117)
BUN: 12 mg/dL (ref 6–23)
CALCIUM: 8.9 mg/dL (ref 8.4–10.5)
CHLORIDE: 104 meq/L (ref 96–112)
CO2: 28 meq/L (ref 19–32)
Creatinine, Ser: 0.79 mg/dL (ref 0.40–1.20)
GFR: 111.5 mL/min (ref >60.00)
GLUCOSE: 78 mg/dL (ref 70–99)
POTASSIUM: 3.9 meq/L (ref 3.5–5.1)
Sodium: 138 mEq/L (ref 135–145)
Total Bilirubin: 0.2 mg/dL (ref 0.2–1.2)
Total Protein: 7.1 g/dL (ref 6.0–8.3)

## 2016-03-17 LAB — HEMOGLOBIN A1C: Hgb A1c MFr Bld: 6.1 % (ref 4.6–6.5)

## 2016-03-17 LAB — LDL CHOLESTEROL, DIRECT: Direct LDL: 95 mg/dL

## 2016-03-17 LAB — LIPID PANEL
CHOL/HDL RATIO: 4
CHOLESTEROL: 147 mg/dL (ref 0–200)
HDL: 38.4 mg/dL — AB (ref >39.00)
NONHDL: 108.8
TRIGLYCERIDES: 250 mg/dL — AB (ref 0.0–149.0)
VLDL: 50 mg/dL — ABNORMAL HIGH (ref 0.0–40.0)

## 2016-03-19 ENCOUNTER — Ambulatory Visit (INDEPENDENT_AMBULATORY_CARE_PROVIDER_SITE_OTHER): Payer: 59 | Admitting: Family Medicine

## 2016-03-19 VITALS — BP 132/88 | HR 79 | Temp 98.4°F | Ht 66.75 in | Wt 225.2 lb

## 2016-03-19 DIAGNOSIS — R5383 Other fatigue: Secondary | ICD-10-CM | POA: Insufficient documentation

## 2016-03-19 DIAGNOSIS — Z8679 Personal history of other diseases of the circulatory system: Secondary | ICD-10-CM | POA: Diagnosis not present

## 2016-03-19 DIAGNOSIS — E119 Type 2 diabetes mellitus without complications: Secondary | ICD-10-CM | POA: Diagnosis not present

## 2016-03-19 DIAGNOSIS — E162 Hypoglycemia, unspecified: Secondary | ICD-10-CM

## 2016-03-19 LAB — HM DIABETES FOOT EXAM

## 2016-03-19 NOTE — Progress Notes (Signed)
Pre visit review using our clinic review tool, if applicable. No additional management support is needed unless otherwise documented below in the visit note. 

## 2016-03-19 NOTE — Assessment & Plan Note (Signed)
Well controlled on no med. 

## 2016-03-19 NOTE — Assessment & Plan Note (Signed)
As well as hair loss and feeling cold    . Heavy bleeding with menses.   Eval with labs including cbc thyroid and vit levels.

## 2016-03-19 NOTE — Patient Instructions (Addendum)
Stop at lab on way out Start daily vitamin. Consider moving meals closer together, consider snack between meals.. Has to healthy like low fat cheese or apples/pears. Check your blood sugar when you feel il.  Have yearly eye exam done.

## 2016-03-19 NOTE — Progress Notes (Signed)
30 year old female presents for DM follow up 2  month out from bariatric surgery.   She has lost 35 lbs in last 6 months! Wt Readings from Last 3 Encounters:  03/19/16 225 lb 4 oz (102.2 kg)  10/24/15 260 lb (117.9 kg)  10/21/15 221 lb (100.2 kg)   Body mass index is 35.54 kg/m.  She has been feeling weak and shaky lately. Occ lightheadedness.  Recent UTI dx Gyn. Treated.   Diabetes:    Stable control on no meds but possibly low.. High protein diet, goes 6 hours without eating. Lab Results  Component Value Date   HGBA1C 6.1 03/16/2016  Using medications without difficulties: None Hypoglycemic episodes: None  Hyperglycemic episodes:None Feet problems: none Blood Sugars averaging: FBS 85-121 eye exam within last year: done     Hypertension:  No longer on lisinopril after weight loss.   BP Readings from Last 3 Encounters:  03/19/16 132/88  10/24/15 (!) 138/94  10/21/15 (!) 146/79   Using medication without problems or lightheadedness: None Chest pain with exertion:None Edema:None Short of breath:None Average home BPs: not checking Other issues:   LDL at goal < 100.    Lab Results  Component Value Date   CHOL 147 03/16/2016   HDL 38.40 (L) 03/16/2016   LDLCALC 81 10/09/2015   LDLDIRECT 95.0 03/16/2016   TRIG 250.0 (H) 03/16/2016   CHOLHDL 4 03/16/2016       Review of Systems  Constitutional: Negative for fever and fatigue.  HENT: Negative for ear pain.   Eyes: Negative for pain.  Respiratory: Negative for chest tightness and shortness of breath.   Cardiovascular: Negative for chest pain, palpitations and leg swelling.  Gastrointestinal: Negative for abdominal pain.  Genitourinary: Negative for dysuria.       Objective:   Physical Exam  Constitutional: Vital signs are normal. She appears well-developed and well-nourished. She is cooperative.  Non-toxic appearance. She does not appear ill. No distress.  HENT:  Head: Normocephalic.  Right Ear:  Hearing, tympanic membrane, external ear and ear canal normal. Tympanic membrane is not erythematous, not retracted and not bulging.  Left Ear: Hearing, tympanic membrane, external ear and ear canal normal. Tympanic membrane is not erythematous, not retracted and not bulging.  Nose: No mucosal edema or rhinorrhea. Right sinus exhibits no maxillary sinus tenderness and no frontal sinus tenderness. Left sinus exhibits no maxillary sinus tenderness and no frontal sinus tenderness.  Mouth/Throat: Uvula is midline, oropharynx is clear and moist and mucous membranes are normal.  Eyes: Conjunctivae, EOM and lids are normal. Pupils are equal, round, and reactive to light. Lids are everted and swept, no foreign bodies found.  Neck: Trachea normal and normal range of motion. Neck supple. Carotid bruit is not present. No thyroid mass and no thyromegaly present.  Cardiovascular: Normal rate, regular rhythm, S1 normal, S2 normal, normal heart sounds, intact distal pulses and normal pulses.  Exam reveals no gallop and no friction rub.   No murmur heard. Pulmonary/Chest: Effort normal and breath sounds normal. No tachypnea. No respiratory distress. She has no decreased breath sounds. She has no wheezes. She has no rhonchi. She has no rales.  Abdominal: Soft. Normal appearance and bowel sounds are normal. There is no tenderness.  Neurological: She is alert.  Skin: Skin is warm, dry and intact. No rash noted.  Psychiatric: Her speech is normal and behavior is normal. Judgment and thought content normal. Her mood appears not anxious. Cognition and memory are normal. She does  not exhibit a depressed mood.    Diabetic foot exam: Normal inspection except dry skin  Bilaterally. No skin breakdown No calluses  Normal DP pulses Normal sensation to light touch and monofilament Nails normal

## 2016-03-19 NOTE — Assessment & Plan Note (Signed)
Well controlled on no med. Great work with weight loss.  Likely symptoms are los CBGs.. Check CBG when feeling ill.  Avoid > 5 hr btw meals. Try healthy snack.

## 2016-03-20 ENCOUNTER — Encounter: Payer: Self-pay | Admitting: *Deleted

## 2016-03-20 LAB — CBC WITH DIFFERENTIAL/PLATELET
BASOS ABS: 0.1 10*3/uL (ref 0.0–0.1)
Basophils Relative: 1 % (ref 0.0–3.0)
EOS PCT: 2.1 % (ref 0.0–5.0)
Eosinophils Absolute: 0.2 10*3/uL (ref 0.0–0.7)
HCT: 36.1 % (ref 36.0–46.0)
HEMOGLOBIN: 11.9 g/dL — AB (ref 12.0–15.0)
LYMPHS ABS: 3.2 10*3/uL (ref 0.7–4.0)
Lymphocytes Relative: 36.6 % (ref 12.0–46.0)
MCHC: 32.8 g/dL (ref 30.0–36.0)
MCV: 78 fl (ref 78.0–100.0)
MONO ABS: 0.8 10*3/uL (ref 0.1–1.0)
MONOS PCT: 9.6 % (ref 3.0–12.0)
NEUTROS PCT: 50.7 % (ref 43.0–77.0)
Neutro Abs: 4.4 10*3/uL (ref 1.4–7.7)
Platelets: 326 10*3/uL (ref 150.0–400.0)
RBC: 4.63 Mil/uL (ref 3.87–5.11)
RDW: 14 % (ref 11.5–15.5)
WBC: 8.7 10*3/uL (ref 4.0–10.5)

## 2016-03-20 LAB — VITAMIN B12: VITAMIN B 12: 144 pg/mL — AB (ref 211–911)

## 2016-03-20 LAB — TSH: TSH: 0.86 u[IU]/mL (ref 0.35–4.50)

## 2016-03-20 LAB — T4, FREE: Free T4: 0.61 ng/dL (ref 0.60–1.60)

## 2016-03-20 LAB — T3, FREE: T3 FREE: 3.5 pg/mL (ref 2.3–4.2)

## 2016-03-30 ENCOUNTER — Encounter: Payer: Self-pay | Admitting: Physician Assistant

## 2016-03-30 ENCOUNTER — Ambulatory Visit: Payer: Self-pay | Admitting: Physician Assistant

## 2016-03-30 VITALS — BP 140/100 | HR 78 | Temp 98.7°F

## 2016-03-30 DIAGNOSIS — K047 Periapical abscess without sinus: Secondary | ICD-10-CM

## 2016-03-30 MED ORDER — AMOXICILLIN 875 MG PO TABS: 875.0000 mg | ORAL_TABLET | Freq: Two times a day (BID) | ORAL | 0 refills | Status: DC

## 2016-03-30 MED ORDER — IBUPROFEN 800 MG PO TABS
800.0000 mg | ORAL_TABLET | Freq: Three times a day (TID) | ORAL | 0 refills | Status: DC | PRN
Start: 2016-03-30 — End: 2016-05-21

## 2016-03-30 NOTE — Progress Notes (Signed)
S: c/o r sided facial and throat pain, no fever/chills, took a lot of ibuprofen without any relief, area has been swollen, no known hx of bad tooth  O: vitals wnl, nad, left side of jaw tender, some swelling, left lower molar + dental caries, no swelling at gum, throat wnl, neck supple no lymph, lungs c t a, cv rrr, 1% xylocaine used for dental block  A: dental abscess  P: amoxil 875mg  bid x 10d, ibuprofen 800mg  tid, f/u with dentist asap

## 2016-04-14 ENCOUNTER — Encounter: Payer: Self-pay | Admitting: Emergency Medicine

## 2016-04-14 ENCOUNTER — Emergency Department
Admission: EM | Admit: 2016-04-14 | Discharge: 2016-04-14 | Disposition: A | Discharge status: Home / Self Care | Payer: 59 | Attending: Emergency Medicine | Admitting: Emergency Medicine

## 2016-04-14 DIAGNOSIS — K0889 Other specified disorders of teeth and supporting structures: Secondary | ICD-10-CM

## 2016-04-14 DIAGNOSIS — Z791 Long term (current) use of non-steroidal anti-inflammatories (NSAID): Secondary | ICD-10-CM | POA: Insufficient documentation

## 2016-04-14 DIAGNOSIS — I10 Essential (primary) hypertension: Secondary | ICD-10-CM | POA: Insufficient documentation

## 2016-04-14 DIAGNOSIS — E119 Type 2 diabetes mellitus without complications: Secondary | ICD-10-CM | POA: Diagnosis not present

## 2016-04-14 DIAGNOSIS — H9202 Otalgia, left ear: Secondary | ICD-10-CM | POA: Diagnosis not present

## 2016-04-14 DIAGNOSIS — Z79899 Other long term (current) drug therapy: Secondary | ICD-10-CM | POA: Insufficient documentation

## 2016-04-14 DIAGNOSIS — K0381 Cracked tooth: Secondary | ICD-10-CM | POA: Insufficient documentation

## 2016-04-14 MED ORDER — AMOXICILLIN 500 MG PO CAPS: 500.0000 mg | ORAL_CAPSULE | Freq: Three times a day (TID) | ORAL | 0 refills | Status: DC

## 2016-04-14 MED ORDER — OXYCODONE-ACETAMINOPHEN 5-325 MG PO TABS: 1.0000 | ORAL_TABLET | Freq: Four times a day (QID) | ORAL | 0 refills | Status: DC | PRN

## 2016-04-14 NOTE — ED Triage Notes (Signed)
Pt comes into the ED via POC c/o toothache and otalgia.  Patient seen a month ago for dental pain and was prescribed amoxicillin.  Patient has received to relief with discomfort and it has progressed to left ear pain.  Patient in NAD at this time with even and unlabored respirations and ambulatory to triage.

## 2016-04-14 NOTE — Discharge Instructions (Signed)
Follow-up list of dental clinics provided; OPTIONS FOR DENTAL FOLLOW UP CARE  [place] Department of Health and Human Services - Local Safety Net Dental Clinics [email or web address]://www.ncdhhs.gov/dph/oralhealth/services/safetynetclinics.htm   [facility] [phone])  [facility] [phone])  [facility] [phone]  [facility] [phone]  [facility] [phone]) This clinic caters to the indigent population and is on a lottery system. Location: [employer] [address], [address], [place] [facility] Hours: Wednesdays from 6pm - 9pm, patients seen by a lottery system. For dates, call or go to [email or web address] Services: Cleanings, fillings and simple extractions. Payment Options: DENTAL WORK IS FREE OF CHARGE. Bring proof of income or support. Best way to get seen: Arrive at 5:15 pm - this is a lottery, NOT first come/first serve, so arriving earlier will not increase your chances of being seen.     [facility] [phone] Select option 1 for emergencies   Location: [facility], [address], [address], [place] [facility] Hours: No walk-ins accepted - call the day before to schedule an appointment. Check in times are 9:30 am and 1:30 pm. Services: Simple extractions, temporary fillings, pulpectomy/pulp debridement, uncomplicated abscess drainage. Payment Options: PAYMENT IS DUE AT THE TIME OF SERVICE.  Fee is usually $100-200, additional surgical procedures (e.g. abscess drainage) may be extra. Cash, checks, Visa/MasterCard accepted.  Can file Medicaid if patient is covered for dental - patient should call case worker to check. No discount for [facility] patients. Best way to get seen: MUST call the day before and get onto the schedule. Can usually be seen the next 1-2 days. No walk-ins accepted.     [facility] [phone]   Location: [facility], [address], [place] Clinic Hours: M, W, Th, F 8am or 1:30pm, Tues 9a or 1:30 - first come/first served. Services: Simple extractions, temporary fillings, uncomplicated abscess drainage.  You do not need to be an [facility] resident. Payment Options: PAYMENT IS DUE AT THE TIME OF SERVICE. Dental insurance, otherwise sliding scale - bring proof of income or support. Depending on income and treatment needed, cost is usually $50-200. Best way to get seen: Arrive early as it is first come/first served.     [facility] [phone]   Location: [address] Clinic Hours: Mon-Thu 8a-5p Services: Most basic dental services including extractions and fillings. Payment Options: PAYMENT IS DUE AT THE TIME OF SERVICE. Sliding scale, up to 50% off - bring proof if income or support. Medicaid with dental option accepted. Best way to get seen: Call to schedule an appointment, can usually be seen within 2 weeks OR they will try to see walk-ins - show up at 8a or 2p (you may have to wait).     [facility] [phone] [place] RESIDENTS ONLY   Location: [facility], [address], [place], [place] [address] Clinic Hours: By appointment only. Monday - Thursday 8am-5pm, Friday 8am-12pm Services: Cleanings, fillings, extractions. Payment Options: PAYMENT IS DUE AT THE TIME OF SERVICE. Cash, Visa or MasterCard. Sliding scale - $30 minimum per service. Best way to get seen: Come in to office, complete packet and make an appointment - need proof of income or support monies for each household member and proof of [facility] residence. Usually takes about a month to get in.     [facility] [phone]  Location: [address], [place] Clinic Hours: Walk-in Urgent Care Dental Services  are offered Monday-Friday mornings only. The numbers of emergencies accepted daily is limited to the number of providers available. Maximum 15 - Mondays, Wednesdays & Thursdays Maximum 10 - Tuesdays & Fridays Services: You do not need to be a [facility] resident to be seen for a dental emergency. Emergencies are defined as pain, swelling, abnormal bleeding, or dental trauma. Walkins will receive x-rays if needed. NOTE: Dental cleaning is not an emergency. Payment Options: PAYMENT IS DUE AT THE TIME OF SERVICE. Minimum co-pay is $40.00 for uninsured patients. Minimum co-pay is $3.00 for Medicaid with dental coverage. Dental Insurance is accepted and must be presented at time of visit. Medicare does not cover dental. Forms of payment: Cash, credit card, checks. Best way to get seen: If not previously registered with the clinic, walk-in dental registration begins at 7:15 am and is on a first come/first serve basis. If previously registered with the clinic, call to make an appointment.     [facility] [place] ONLY   Location: [address], [place], [place] Clinic Hours: Mon-Thu 10a-2p Services: Extractions only! Payment Options: FREE (donations accepted) - bring proof of income or support Best way to get seen: Call and schedule an appointment OR come at 8am on the 1st Monday of every month (except for holidays) when it is first come/first served.     [facility] [phone]   Location: [place], [place] Clinic Hours: Friday mornings Services, Payment Options, Best way to get seen: Call for info

## 2016-04-14 NOTE — ED Provider Notes (Signed)
[facility] Emergency Department Provider Note   ____________________________________________   First MD Initiated Contact with Patient 04/14/16 1940     (approximate)  I have reviewed the triage vital signs and the nursing notes.   HISTORY  Chief Complaint Dental Pain and Otalgia    HPI [name] is a 30 y.o. female patient complaining of dental pain radiating from her left molar to the ear. Patient states she was seen last month for the same complaint and was prescribed amoxicillin. Patient states she has not follow-up with dentist due to the lack of dental insurance. Patient's abdomen shows is not going to affected first year. Patient denies any fever associated this complaint. She denies any swelling of the gingiva. Patient rates the pain as a 10 over 10. Patient described a pain as "sharp". No palliative measures for this complaint.   Past Medical History:  Diagnosis Date  . Diabetes mellitus without complication (HCC)    before gastric sleeve procedure  . GERD (gastroesophageal reflux disease)   . Headache    when BP is up  . Hypertension   . Wears contact lenses     Patient Active Problem List   Diagnosis Date Noted  . Fatigue 03/19/2016  . Non-intractable cyclical vomiting with nausea   . Reflux esophagitis   . Abdominal pain, epigastric   . GERD (gastroesophageal reflux disease) 05/07/2015  . Bariatric surgery status 03/19/2015  . Morbid obesity (HCC) 05/16/2013  . Type II diabetes mellitus, well controlled (HCC) 09/22/2012  . Hx of primary hypertension 09/22/2012    Past Surgical History:  Procedure Laterality Date  . CESAREAN SECTION  04/30/2009   decreased fetal heart tones due to umbilical cord wrapped  . CESAREAN SECTION    . CHOLECYSTECTOMY    . ESOPHAGOGASTRODUODENOSCOPY (EGD) WITH PROPOFOL N/A 10/24/2015   Procedure: ESOPHAGOGASTRODUODENOSCOPY (EGD) WITH PROPOFOL;  Surgeon: Midge Minium, MD;  Location: [facility];  Service: Endoscopy;  Laterality: N/A;  . LAPAROSCOPIC GASTRIC SLEEVE RESECTION N/A 03/19/2015   Procedure: LAPAROSCOPIC GASTRIC SLEEVE RESECTION;  Surgeon: Everette Rank, MD;  Location: [facility] OR[facility];  Service: General;  Laterality: N/A;    Prior to Admission medications   Medication Sig Start Date End Date Taking? Authorizing Provider  dexlansoprazole (DEXILANT) 60 MG capsule Take 1 capsule (60 mg total) by mouth daily. 12/09/15  Yes Midge Minium, MD  etonogestrel (NEXPLANON) 68 MG IMPL implant 1 each by Subdermal route once.   Yes Historical Provider, MD  ibuprofen (ADVIL,MOTRIN) 800 MG tablet Take 1 tablet (800 mg total) by mouth every 8 (eight) hours as needed. 03/30/16  Yes Faythe Ghee, PA-C  amoxicillin (AMOXIL) 500 MG capsule Take 1 capsule (500 mg total) by mouth 3 (three) times daily. 04/14/16   Joni Reining, PA-C  amoxicillin (AMOXIL) 875 MG tablet Take 1 tablet (875 mg total) by mouth 2 (two) times daily. Patient not taking: Reported on 04/14/2016 03/30/16   Faythe Ghee, PA-C  oxyCODONE-acetaminophen (ROXICET) 5-325 MG tablet Take 1 tablet by mouth every 6 (six) hours as needed. 04/14/16 04/14/17  Joni Reining, PA-C    Allergies Tomato  Family History  Problem Relation Age of Onset  . Diabetes Father   . Hypertension Father   . Hyperlipidemia Father   . Hypertension Sister   . Birth defects Sister     fluid on brain cause blindness in B eye    Social History Social History  Substance Use Topics  . Smoking  status: Never Smoker  . Smokeless tobacco: Never Used  . Alcohol use No     Comment: 1 drink/mo    Review of Systems Constitutional: No fever/chills Eyes: No visual changes. ENT: No sore throat.Dental and ear pain Cardiovascular: Denies chest pain. Respiratory: Denies shortness of breath. Gastrointestinal: No abdominal pain.  No nausea, no vomiting.  No diarrhea.  No constipation. Genitourinary: Negative for dysuria. Musculoskeletal: Negative for  back pain. Skin: Negative for rash. Neurological: Negative for headaches, focal weakness or numbness. Endocrine:Diabetes and hypertension. Hematological/Lymphatic: Allergic/Immunilogical: Tomatoes   ____________________________________________   PHYSICAL EXAM:  VITAL SIGNS: ED Triage Vitals  Enc Vitals Group     BP 04/14/16 1912 (!) 153/94     Pulse Rate 04/14/16 1912 83     Resp 04/14/16 1912 16     Temp 04/14/16 1912 98.3 F (36.8 C)     Temp Source 04/14/16 1912 Oral     SpO2 04/14/16 1912 100 %     Weight 04/14/16 1910 206 lb (93.4 kg)     Height 04/14/16 1910 5\' 6"  (1.676 m)     Head Circumference --      Peak Flow --      Pain Score 04/14/16 1910 10     Pain Loc --      Pain Edu? --      Excl. in GC? --     Constitutional: Alert and oriented. Well appearing and in no acute distress. Eyes: Conjunctivae are normal. PERRL. EOMI. Head: Atraumatic. Nose: No congestion/rhinnorhea. Mouth/Throat: Mucous membranes are moist.  Oropharynx non-erythematous.Fractured tooth #18 Neck: No stridor.  No cervical spine tenderness to palpation. Hematological/Lymphatic/Immunilogical: No cervical lymphadenopathy. Cardiovascular: Normal rate, regular rhythm. Grossly normal heart sounds.  Good peripheral circulation. Respiratory: Normal respiratory effort.  No retractions. Lungs CTAB. Gastrointestinal: Soft and nontender. No distention. No abdominal bruits. No CVA tenderness. Musculoskeletal: No lower extremity tenderness nor edema.  No joint effusions. Neurologic:  Normal speech and language. No gross focal neurologic deficits are appreciated. No gait instability. Skin:  Skin is warm, dry and intact. No rash noted. Psychiatric: Mood and affect are normal. Speech and behavior are normal.  ____________________________________________   LABS (all labs ordered are listed, but only abnormal results are displayed)  Labs Reviewed - No data to  display ____________________________________________  EKG   ____________________________________________  RADIOLOGY   ____________________________________________   PROCEDURES  Procedure(s) performed: None  Procedures  Critical Care performed: No  ____________________________________________   INITIAL IMPRESSION / ASSESSMENT AND PLAN / ED COURSE  Pertinent labs & imaging results that were available during my care of the patient were reviewed by me and considered in my medical decision making (see chart for details).  Dental pain secondary fractured tooth. Patient given discharge care instructions. Patient get a prescription for Percocet and amoxicillin. Patient [facility].  Clinical Course     ____________________________________________   FINAL CLINICAL IMPRESSION(S) / ED DIAGNOSES  Final diagnoses:  Pain, dental  Left ear pain      NEW MEDICATIONS STARTED DURING THIS VISIT:  New Prescriptions   AMOXICILLIN (AMOXIL) 500 MG CAPSULE    Take 1 capsule (500 mg total) by mouth 3 (three) times daily.   OXYCODONE-ACETAMINOPHEN (ROXICET) 5-325 MG TABLET    Take 1 tablet by mouth every 6 (six) hours as needed.     Note:  This document was prepared using Dragon voice recognition software and may include unintentional dictation errors.    Windy Fastonald  Kevin FentonK Jenae Tomasello, PA-C 04/14/16 1953    Phineas SemenGraydon Goodman, MD 04/14/16 2029

## 2016-05-21 ENCOUNTER — Inpatient Hospital Stay
Admission: EM | Admit: 2016-05-21 | Discharge: 2016-05-24 | DRG: 872 | Disposition: A | Discharge status: Home / Self Care | Payer: 59 | Attending: Internal Medicine | Admitting: Internal Medicine

## 2016-05-21 ENCOUNTER — Emergency Department: Payer: 59

## 2016-05-21 DIAGNOSIS — Z6837 Body mass index (BMI) 37.0-37.9, adult: Secondary | ICD-10-CM

## 2016-05-21 DIAGNOSIS — E119 Type 2 diabetes mellitus without complications: Secondary | ICD-10-CM | POA: Diagnosis present

## 2016-05-21 DIAGNOSIS — Z91018 Allergy to other foods: Secondary | ICD-10-CM | POA: Diagnosis not present

## 2016-05-21 DIAGNOSIS — B9620 Unspecified Escherichia coli [E. coli] as the cause of diseases classified elsewhere: Secondary | ICD-10-CM | POA: Diagnosis present

## 2016-05-21 DIAGNOSIS — A419 Sepsis, unspecified organism: Secondary | ICD-10-CM | POA: Diagnosis present

## 2016-05-21 DIAGNOSIS — R6889 Other general symptoms and signs: Secondary | ICD-10-CM | POA: Diagnosis not present

## 2016-05-21 DIAGNOSIS — Z9884 Bariatric surgery status: Secondary | ICD-10-CM

## 2016-05-21 DIAGNOSIS — A4151 Sepsis due to Escherichia coli [E. coli]: Secondary | ICD-10-CM | POA: Diagnosis not present

## 2016-05-21 DIAGNOSIS — R509 Fever, unspecified: Secondary | ICD-10-CM | POA: Diagnosis not present

## 2016-05-21 DIAGNOSIS — R51 Headache: Secondary | ICD-10-CM | POA: Diagnosis present

## 2016-05-21 DIAGNOSIS — M545 Low back pain: Secondary | ICD-10-CM | POA: Diagnosis not present

## 2016-05-21 DIAGNOSIS — Z833 Family history of diabetes mellitus: Secondary | ICD-10-CM

## 2016-05-21 DIAGNOSIS — E538 Deficiency of other specified B group vitamins: Secondary | ICD-10-CM | POA: Diagnosis not present

## 2016-05-21 DIAGNOSIS — E876 Hypokalemia: Secondary | ICD-10-CM | POA: Diagnosis present

## 2016-05-21 DIAGNOSIS — Z793 Long term (current) use of hormonal contraceptives: Secondary | ICD-10-CM

## 2016-05-21 DIAGNOSIS — R8271 Bacteriuria: Secondary | ICD-10-CM | POA: Diagnosis present

## 2016-05-21 DIAGNOSIS — Z8249 Family history of ischemic heart disease and other diseases of the circulatory system: Secondary | ICD-10-CM

## 2016-05-21 DIAGNOSIS — N390 Urinary tract infection, site not specified: Secondary | ICD-10-CM | POA: Diagnosis not present

## 2016-05-21 DIAGNOSIS — R109 Unspecified abdominal pain: Secondary | ICD-10-CM | POA: Diagnosis not present

## 2016-05-21 DIAGNOSIS — I10 Essential (primary) hypertension: Secondary | ICD-10-CM | POA: Diagnosis present

## 2016-05-21 DIAGNOSIS — F99 Mental disorder, not otherwise specified: Secondary | ICD-10-CM | POA: Diagnosis not present

## 2016-05-21 LAB — CBC WITH DIFFERENTIAL/PLATELET
Basophils Absolute: 0 10*3/uL (ref 0–0.1)
Basophils Relative: 1 %
Eosinophils Absolute: 0.1 10*3/uL (ref 0–0.7)
Eosinophils Relative: 1 %
HEMATOCRIT: 37.8 % (ref 35.0–47.0)
HEMOGLOBIN: 12.9 g/dL (ref 12.0–16.0)
LYMPHS ABS: 0.9 10*3/uL — AB (ref 1.0–3.6)
LYMPHS PCT: 12 %
MCH: 26 pg (ref 26.0–34.0)
MCHC: 34 g/dL (ref 32.0–36.0)
MCV: 76.6 fL — AB (ref 80.0–100.0)
Monocytes Absolute: 0.2 10*3/uL (ref 0.2–0.9)
Monocytes Relative: 2 %
NEUTROS ABS: 6.1 10*3/uL (ref 1.4–6.5)
NEUTROS PCT: 84 %
Platelets: 224 10*3/uL (ref 150–440)
RBC: 4.94 MIL/uL (ref 3.80–5.20)
RDW: 13.9 % (ref 11.5–14.5)
WBC: 7.2 10*3/uL (ref 3.6–11.0)

## 2016-05-21 LAB — COMPREHENSIVE METABOLIC PANEL
ALT: 18 U/L (ref 14–54)
ANION GAP: 11 (ref 5–15)
AST: 21 U/L (ref 15–41)
Albumin: 4 g/dL (ref 3.5–5.0)
Alkaline Phosphatase: 64 U/L (ref 38–126)
BUN: 9 mg/dL (ref 6–20)
CHLORIDE: 106 mmol/L (ref 101–111)
CO2: 22 mmol/L (ref 22–32)
Calcium: 8.9 mg/dL (ref 8.9–10.3)
Creatinine, Ser: 0.75 mg/dL (ref 0.44–1.00)
Glucose, Bld: 119 mg/dL — ABNORMAL HIGH (ref 65–99)
POTASSIUM: 3.3 mmol/L — AB (ref 3.5–5.1)
Sodium: 139 mmol/L (ref 135–145)
Total Bilirubin: 0.8 mg/dL (ref 0.3–1.2)
Total Protein: 7.8 g/dL (ref 6.5–8.1)

## 2016-05-21 LAB — URINALYSIS, COMPLETE (UACMP) WITH MICROSCOPIC
BILIRUBIN URINE: NEGATIVE
Glucose, UA: NEGATIVE mg/dL
KETONES UR: NEGATIVE mg/dL
NITRITE: NEGATIVE
Protein, ur: NEGATIVE mg/dL
Specific Gravity, Urine: 1.006 (ref 1.005–1.030)
pH: 6 (ref 5.0–8.0)

## 2016-05-21 LAB — INFLUENZA PANEL BY PCR (TYPE A & B)
INFLAPCR: NEGATIVE
Influenza B By PCR: NEGATIVE

## 2016-05-21 LAB — LACTIC ACID, PLASMA: Lactic Acid, Venous: 1.5 mmol/L (ref 0.5–1.9)

## 2016-05-21 LAB — POCT PREGNANCY, URINE: PREG TEST UR: NEGATIVE

## 2016-05-21 MED ORDER — KETOROLAC TROMETHAMINE 30 MG/ML IJ SOLN
15.0000 mg | Freq: Once | INTRAMUSCULAR | Status: AC
  Administered 2016-05-21: 15 mg via INTRAVENOUS
  Filled 2016-05-21: qty 1

## 2016-05-21 MED ORDER — SODIUM CHLORIDE 0.9 % IV BOLUS (SEPSIS)
1000.0000 mL | Freq: Once | INTRAVENOUS | Status: AC
  Administered 2016-05-21: 1000 mL via INTRAVENOUS

## 2016-05-21 MED ORDER — LORAZEPAM 2 MG/ML IJ SOLN
INTRAMUSCULAR | Status: AC
  Administered 2016-05-21: 0.5 mg via INTRAVENOUS
  Filled 2016-05-21: qty 1

## 2016-05-21 MED ORDER — CEFEPIME-DEXTROSE 1 GM/50ML IV SOLR
1.0000 g | Freq: Once | INTRAVENOUS | Status: AC
  Administered 2016-05-21: 1 g via INTRAVENOUS
  Filled 2016-05-21: qty 50

## 2016-05-21 MED ORDER — ACETAMINOPHEN 500 MG PO TABS
1000.0000 mg | ORAL_TABLET | Freq: Once | ORAL | Status: AC
  Administered 2016-05-21: 1000 mg via ORAL
  Filled 2016-05-21: qty 2

## 2016-05-21 MED ORDER — KETOROLAC TROMETHAMINE 30 MG/ML IJ SOLN
30.0000 mg | Freq: Once | INTRAMUSCULAR | Status: AC
  Administered 2016-05-21: 30 mg via INTRAVENOUS
  Filled 2016-05-21: qty 1

## 2016-05-21 MED ORDER — LIDOCAINE HCL (PF) 1 % IJ SOLN
5.0000 mL | Freq: Once | INTRAMUSCULAR | Status: AC
  Administered 2016-05-21: 5 mL

## 2016-05-21 MED ORDER — LORAZEPAM 2 MG/ML IJ SOLN
0.5000 mg | Freq: Once | INTRAMUSCULAR | Status: AC
  Administered 2016-05-21: 0.5 mg via INTRAVENOUS

## 2016-05-21 MED ORDER — LIDOCAINE HCL (PF) 1 % IJ SOLN
INTRAMUSCULAR | Status: AC
  Administered 2016-05-21: 5 mL
  Filled 2016-05-21: qty 5

## 2016-05-21 MED ORDER — SODIUM CHLORIDE 0.9 % IV SOLN
1500.0000 mg | Freq: Once | INTRAVENOUS | Status: AC
  Administered 2016-05-21: 1500 mg via INTRAVENOUS
  Filled 2016-05-21: qty 1500

## 2016-05-21 NOTE — ED Triage Notes (Signed)
Patient presents to the ED via EMS from work with headache and "tingling and warm" sensation today.  Patient reports she feels like she is being shocked.  Patient reports nausea but denies vomiting.  Patient is talking on her cellphone during triage.  No obvious distress at this time.

## 2016-05-21 NOTE — ED Notes (Signed)
Family at bedside denies needs while pt. Out of department for procedure.

## 2016-05-21 NOTE — ED Triage Notes (Signed)
Patient is tearful and reports feeling bad all day. Shaking.  Patient's skin is warm to touch.  Patient states, "I'm so cold and I can't get warm and then I feel really warm.  I feel terrible and I don't know why."

## 2016-05-21 NOTE — ED Provider Notes (Signed)
[facility] Emergency Department Provider Note  ____________________________________________  Time seen: Approximately 3:14 PM  I have reviewed the triage vital signs and the nursing notes.   HISTORY  Chief Complaint Headache   HPI [name] is a 30 y.o. female with history of gastric bypass who presents for evaluation of fever, headache, and upper back pain. Patient reports that she woke up at 3 AM this morning feeling sick with generalized body aches. She went to work and noticed that she had a fever. She came back home, took 4 ibuprofen and went to sleep. When she woke up the fever was worse, she was shivering and had a bad HA. She describes her HA as severe, frontal and occipital, constant and non radiating. She also has had a cough for the last week. 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 Her vaccines are up-to-date including influenza. She does have a 28-year-old at home was not feeling well today as well. Patient denies neck stiffness, rash, changes in vision, chest pain, shortness of breath, abdominal pain, diarrhea, dysuria, vaginal discharge. She has been dry heaving and has nausea. She reports body aches and pain in the upper thoracic back that is constant and non pleuritic in nature.  Past Medical History:  Diagnosis Date  . Diabetes mellitus without complication (HCC)    before gastric sleeve procedure  . GERD (gastroesophageal reflux disease)   . Headache    when BP is up  . Hypertension   . Wears contact lenses     Patient Active Problem List   Diagnosis Date Noted  . Fatigue 03/19/2016  . Non-intractable cyclical vomiting with nausea   . Reflux esophagitis   . Abdominal pain, epigastric   . GERD (gastroesophageal reflux disease) 05/07/2015  . Bariatric surgery status 03/19/2015  . Morbid obesity (HCC) 05/16/2013  . Type II diabetes mellitus, well controlled (HCC) 09/22/2012  . Hx of primary hypertension 09/22/2012      Past Surgical History:  Procedure Laterality Date  . CESAREAN SECTION  04/30/2009   decreased fetal heart tones due to umbilical cord wrapped  . CESAREAN SECTION    . CHOLECYSTECTOMY    . ESOPHAGOGASTRODUODENOSCOPY (EGD) WITH PROPOFOL N/A 10/24/2015   Procedure: ESOPHAGOGASTRODUODENOSCOPY (EGD) WITH PROPOFOL;  Surgeon: Midge Miniumarren Wohl, MD;  [facility];  Service: Endoscopy;  Laterality: N/A;  . LAPAROSCOPIC GASTRIC SLEEVE RESECTION N/A 03/19/2015   Procedure: LAPAROSCOPIC GASTRIC SLEEVE RESECTION;  Surgeon: Everette RankMichael A Tyner, MD;  Location: [facility];  Service: General;  Laterality: N/A;    Prior to Admission medications   Medication Sig Start Date End Date Taking? Authorizing Provider  etonogestrel (NEXPLANON) 68 MG IMPL implant 1 each by Subdermal route once.   Yes Historical Provider, MD    Allergies Tomato  Family History  Problem Relation Age of Onset  . Diabetes Father   . Hypertension Father   . Hyperlipidemia Father   . Hypertension Sister   . Birth defects Sister     fluid on brain cause blindness in B eye    Social History Social History  Substance Use Topics  . Smoking status: Never Smoker  . Smokeless tobacco: Never Used  . Alcohol use No     Comment: 1 drink/mo    Review of Systems  Constitutional: + fever, chills, body aches Eyes: Negative for visual changes. ENT: Negative for sore throat. Neck: No neck pain  Cardiovascular: Negative for chest pain. Respiratory: Negative for shortness of breath. +  cough Gastrointestinal: Negative for abdominal pain, vomiting or diarrhea. + N and dry heaves Genitourinary: Negative for dysuria. Musculoskeletal: + upper back pain. Skin: Negative for rash. Neurological: Negative for  weakness or numbness. + HA Psych: No SI or HI  ____________________________________________   PHYSICAL EXAM:  VITAL SIGNS: ED Triage Vitals  Enc Vitals Group     BP 05/21/16 1453 (!) 149/63     Pulse Rate 05/21/16  1453 (!) 170     Resp 05/21/16 1453 18     Temp 05/21/16 1453 (!) 101.4 F (38.6 C)     Temp Source 05/21/16 1453 Oral     SpO2 05/21/16 1453 97 %     Weight 05/21/16 1454 212 lb (96.2 kg)     Height 05/21/16 1454 5\' 6"  (1.676 m)     Head Circumference --      Peak Flow --      Pain Score --      Pain Loc --      Pain Edu? --      Excl. in GC? --     Constitutional: Alert and oriented. Well appearing and in no apparent distress. HEENT:      Head: Normocephalic and atraumatic.         Eyes: Conjunctivae are normal. Sclera is non-icteric. EOMI. PERRL      Mouth/Throat: Mucous membranes are moist.       Neck: Supple with no signs of meningismus. Negative head jolt accentuating maneuver Cardiovascular: Tachycardic with regular rhythm. No murmurs, gallops, or rubs. 2+ symmetrical distal pulses are present in all extremities. No JVD. Respiratory: Normal respiratory effort. Lungs are clear to auscultation bilaterally. Diminished sounds on right with faint wheezing. Normal on the left Gastrointestinal: Soft, non tender, and non distended with positive bowel sounds. No rebound or guarding. Genitourinary: No CVA tenderness. Musculoskeletal: Nontender with normal range of motion in all extremities. No edema, cyanosis, or erythema of extremities. NO midline c/t/l spine ttp Neurologic: Normal speech and language. Face is symmetric. Moving all extremities. No gross focal neurologic deficits are appreciated. Skin: Skin is warm, dry and intact. No rash noted. Psychiatric: Mood and affect are normal. Speech and behavior are normal.  ____________________________________________   LABS (all labs ordered are listed, but only abnormal results are displayed)  Labs Reviewed  COMPREHENSIVE METABOLIC PANEL - Abnormal; Notable for the following:       Result Value   Potassium 3.3 (*)    Glucose, Bld 119 (*)    All other components within normal limits  CBC WITH DIFFERENTIAL/PLATELET - Abnormal;  Notable for the following:    MCV 76.6 (*)    Lymphs Abs 0.9 (*)    All other components within normal limits  URINALYSIS, COMPLETE (UACMP) WITH MICROSCOPIC - Abnormal; Notable for the following:    Color, Urine STRAW (*)    APPearance CLEAR (*)    Hgb urine dipstick MODERATE (*)    Leukocytes, UA TRACE (*)    Bacteria, UA RARE (*)    Squamous Epithelial / LPF 0-5 (*)    All other components within normal limits  CULTURE, BLOOD (ROUTINE X 2)  CULTURE, BLOOD (ROUTINE X 2)  URINE CULTURE  CSF CULTURE  GRAM STAIN  LACTIC ACID, PLASMA  INFLUENZA PANEL BY PCR (TYPE A & B, H1N1)  CSF CELL COUNT WITH DIFFERENTIAL  PROTEIN AND GLUCOSE, CSF  GLUCOSE, CSF  PROTEIN, CSF  CSF CELL COUNT WITH DIFFERENTIAL  POC URINE PREG, ED  POCT PREGNANCY, URINE  ____________________________________________  EKG  ED ECG REPORT I, Nita Sickle, the attending physician, personally viewed and interpreted this ECG.  Sinus tachycardia, rate of 155, normal intervals, normal axis, no ST elevations or depressions.  ____________________________________________  RADIOLOGY  WUJ:[id number] inspiration. No acute cardiopulmonary disease  CT renal: Mild fullness of the intrarenal collecting systems and ureters bilaterally without identifiable etiology. Features may be related to hydration status. Correlation for urinary tract infection may prove helpful. 2. Status post cholecystectomy. 3. Status post sleeve gastrectomy. ____________________________________________   PROCEDURES  Procedure(s) performed:yes .Lumbar Puncture Date/Time: 05/21/2016 11:23 PM Performed by: Nita Sickle Authorized by: Nita Sickle   Consent:    Consent obtained:  Written   Consent given by:  Patient   Risks discussed:  Bleeding, headache, nerve damage, infection, pain and repeat procedure Pre-procedure details:    Procedure purpose:  Diagnostic Sedation:    Sedation type:  Anxiolysis Anesthesia (see  MAR for exact dosages):    Anesthesia method:  Local infiltration   Local anesthetic:  Lidocaine 1% w/o epi Procedure details:    Lumbar space:  L4-L5 interspace   Patient position:  L lateral decubitus   Needle gauge:  22   Needle type:  Spinal needle - Quincke tip   Number of attempts:  3 (unsuccessful) Comments:     Unsuccesful   Critical Care performed: yes  CRITICAL CARE Performed by: Nita Sickle  ?  Total critical care time: 35 min  Critical care time was exclusive of separately billable procedures and treating other patients.  Critical care was necessary to treat or prevent imminent or life-threatening deterioration.  Critical care was time spent personally by me on the following activities: development of treatment plan with patient and/or surrogate as well as nursing, discussions with consultants, evaluation of patient's response to treatment, examination of patient, obtaining history from patient or surrogate, ordering and performing treatments and interventions, ordering and review of laboratory studies, ordering and review of radiographic studies, pulse oximetry and re-evaluation of patient's condition.  ____________________________________________   INITIAL IMPRESSION / ASSESSMENT AND PLAN / ED COURSE   30 y.o. female with history of gastric bypass who presents for evaluation of fever, body aches, nausea, vomiting, headache, and upper back pain. Patient meets sepsis criteria and sepsis protocol initiated. 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 Will get flu, cxr, labs, blood cx, ua, labs.  Clinical Course as of May 22 2323  Thu May 21, 2016  1716 Patient feeling improved. WBC, lactate are WNL. UA with blood, trace leuks, and rare bacteria. Patient currently not on her menstrual period. She is now complaining that the pain is her R flank and  she has mild R CVA tenderness. Will get CT renal to rule out kidney stone.  [CV]  1828 CT renal with no evidence of kidney stone. Patient has been consented for a spinal tap.  [CV]    Clinical Course User Index [CV] Nita Sickle, MD    Pertinent labs & imaging results that were available during my care of the patient were reviewed by me and considered in my medical decision making (see chart for details).    ____________________________________________   FINAL CLINICAL IMPRESSION(S) / ED DIAGNOSES  Final diagnoses:  Sepsis, due to unspecified organism [facility])      NEW MEDICATIONS STARTED DURING THIS VISIT:  New Prescriptions   No medications on file  Note:  This document was prepared using Dragon voice recognition software and may include unintentional dictation errors.    Nita Sickle, MD 05/21/16 [phone]

## 2016-05-22 LAB — BASIC METABOLIC PANEL
Anion gap: 5 (ref 5–15)
BUN: 6 mg/dL (ref 6–20)
CALCIUM: 7.5 mg/dL — AB (ref 8.9–10.3)
CO2: 23 mmol/L (ref 22–32)
CREATININE: 0.5 mg/dL (ref 0.44–1.00)
Chloride: 111 mmol/L (ref 101–111)
GFR calc non Af Amer: >60 mL/min (ref >60)
Glucose, Bld: 131 mg/dL — ABNORMAL HIGH (ref 65–99)
Potassium: 3.4 mmol/L — ABNORMAL LOW (ref 3.5–5.1)
Sodium: 139 mmol/L (ref 135–145)

## 2016-05-22 LAB — BLOOD CULTURE ID PANEL (REFLEXED)
Acinetobacter baumannii: NOT DETECTED
CANDIDA TROPICALIS: NOT DETECTED
CARBAPENEM RESISTANCE: NOT DETECTED
Candida albicans: NOT DETECTED
Candida glabrata: NOT DETECTED
Candida krusei: NOT DETECTED
Candida parapsilosis: NOT DETECTED
ENTEROCOCCUS SPECIES: NOT DETECTED
ESCHERICHIA COLI: DETECTED — AB
Enterobacter cloacae complex: NOT DETECTED
Enterobacteriaceae species: DETECTED — AB
HAEMOPHILUS INFLUENZAE: NOT DETECTED
Klebsiella oxytoca: NOT DETECTED
Klebsiella pneumoniae: NOT DETECTED
LISTERIA MONOCYTOGENES: NOT DETECTED
NEISSERIA MENINGITIDIS: NOT DETECTED
PROTEUS SPECIES: NOT DETECTED
Pseudomonas aeruginosa: NOT DETECTED
SERRATIA MARCESCENS: NOT DETECTED
STAPHYLOCOCCUS AUREUS BCID: NOT DETECTED
STAPHYLOCOCCUS SPECIES: NOT DETECTED
STREPTOCOCCUS AGALACTIAE: NOT DETECTED
STREPTOCOCCUS PNEUMONIAE: NOT DETECTED
STREPTOCOCCUS SPECIES: NOT DETECTED
Streptococcus pyogenes: NOT DETECTED

## 2016-05-22 LAB — CSF CELL COUNT WITH DIFFERENTIAL
Eosinophils, CSF: 0 %
LYMPHS CSF: 100 %
Monocyte-Macrophage-Spinal Fluid: 0 %
Other Cells, CSF: 0
RBC Count, CSF: 23 /mm3 — ABNORMAL HIGH (ref 0–3)
Segmented Neutrophils-CSF: 0 %
TUBE #: 1
WBC, CSF: 1 /mm3 (ref 0–5)

## 2016-05-22 LAB — CBC
HCT: 32 % — ABNORMAL LOW (ref 35.0–47.0)
Hemoglobin: 10.6 g/dL — ABNORMAL LOW (ref 12.0–16.0)
MCH: 25.8 pg — AB (ref 26.0–34.0)
MCHC: 33.1 g/dL (ref 32.0–36.0)
MCV: 78 fL — ABNORMAL LOW (ref 80.0–100.0)
PLATELETS: 211 10*3/uL (ref 150–440)
RBC: 4.1 MIL/uL (ref 3.80–5.20)
RDW: 14 % (ref 11.5–14.5)
WBC: 11.2 10*3/uL — ABNORMAL HIGH (ref 3.6–11.0)

## 2016-05-22 LAB — PROTEIN AND GLUCOSE, CSF
GLUCOSE CSF: 77 mg/dL — AB (ref 40–70)
Total  Protein, CSF: 19 mg/dL (ref 15–45)

## 2016-05-22 MED ORDER — VANCOMYCIN HCL IN DEXTROSE 1-5 GM/200ML-% IV SOLN
1000.0000 mg | Freq: Three times a day (TID) | INTRAVENOUS | Status: DC
  Administered 2016-05-22: 1000 mg via INTRAVENOUS
  Filled 2016-05-22 (×3): qty 200

## 2016-05-22 MED ORDER — ACETAMINOPHEN 650 MG RE SUPP: 650.0000 mg | Freq: Four times a day (QID) | RECTAL | Status: DC | PRN

## 2016-05-22 MED ORDER — ZOLPIDEM TARTRATE 5 MG PO TABS
5.0000 mg | ORAL_TABLET | Freq: Every evening | ORAL | Status: DC | PRN
Start: 2016-05-22 — End: 2016-05-24
  Administered 2016-05-22: 01:00:00 5 mg via ORAL
  Filled 2016-05-22: qty 1

## 2016-05-22 MED ORDER — SODIUM CHLORIDE 0.9% FLUSH
3.0000 mL | Freq: Two times a day (BID) | INTRAVENOUS | Status: DC
  Administered 2016-05-22 – 2016-05-23 (×5): 3 mL via INTRAVENOUS

## 2016-05-22 MED ORDER — HYDROCODONE-ACETAMINOPHEN 5-325 MG PO TABS
1.0000 | ORAL_TABLET | ORAL | Status: DC | PRN
  Administered 2016-05-22 – 2016-05-23 (×3): 1 via ORAL
  Filled 2016-05-22 (×3): qty 1

## 2016-05-22 MED ORDER — MEROPENEM 1 G IV SOLR
2.0000 g | Freq: Three times a day (TID) | INTRAVENOUS | Status: DC
  Administered 2016-05-22 – 2016-05-23 (×4): 2 g via INTRAVENOUS
  Filled 2016-05-22 (×6): qty 2

## 2016-05-22 MED ORDER — SENNOSIDES-DOCUSATE SODIUM 8.6-50 MG PO TABS: 1.0000 | ORAL_TABLET | Freq: Every evening | ORAL | Status: DC | PRN

## 2016-05-22 MED ORDER — ONDANSETRON HCL 4 MG PO TABS: 4.0000 mg | ORAL_TABLET | Freq: Four times a day (QID) | ORAL | Status: DC | PRN

## 2016-05-22 MED ORDER — BISACODYL 5 MG PO TBEC: 5.0000 mg | DELAYED_RELEASE_TABLET | Freq: Every day | ORAL | Status: DC | PRN

## 2016-05-22 MED ORDER — POTASSIUM CHLORIDE CRYS ER 20 MEQ PO TBCR
40.0000 meq | EXTENDED_RELEASE_TABLET | Freq: Once | ORAL | Status: AC
  Administered 2016-05-22: 02:00:00 40 meq via ORAL
  Filled 2016-05-22: qty 2

## 2016-05-22 MED ORDER — ONDANSETRON HCL 4 MG/2ML IJ SOLN: 4.0000 mg | Freq: Four times a day (QID) | INTRAMUSCULAR | Status: DC | PRN

## 2016-05-22 MED ORDER — POTASSIUM CHLORIDE CRYS ER 20 MEQ PO TBCR
40.0000 meq | EXTENDED_RELEASE_TABLET | Freq: Once | ORAL | Status: AC
  Administered 2016-05-22: 14:00:00 40 meq via ORAL
  Filled 2016-05-22: qty 2

## 2016-05-22 MED ORDER — SODIUM CHLORIDE 0.9 % IV SOLN
INTRAVENOUS | Status: DC
  Administered 2016-05-22 – 2016-05-24 (×6): via INTRAVENOUS

## 2016-05-22 MED ORDER — MAGNESIUM CITRATE PO SOLN: 1.0000 | Freq: Once | ORAL | Status: DC | PRN

## 2016-05-22 MED ORDER — ALUM & MAG HYDROXIDE-SIMETH 200-200-20 MG/5ML PO SUSP
30.0000 mL | Freq: Four times a day (QID) | ORAL | Status: DC | PRN
  Filled 2016-05-22: qty 30

## 2016-05-22 MED ORDER — ACETAMINOPHEN 325 MG PO TABS
650.0000 mg | ORAL_TABLET | Freq: Four times a day (QID) | ORAL | Status: DC | PRN
  Administered 2016-05-23 (×2): 650 mg via ORAL
  Filled 2016-05-22 (×2): qty 2

## 2016-05-22 NOTE — Progress Notes (Signed)
Assumed care of patient at 1615. Bo McclintockBrewer,Kasarah Sitts S, RN

## 2016-05-22 NOTE — Progress Notes (Signed)
Pharmacy Antibiotic Note  [name] is a 30 y.o. female admitted on 05/21/2016 with sepsis.  Pharmacy has been consulted for meropenem and vancomycin dosing.  Plan: 1. Meropenem 2 gm IV Q8H 2. Vancomycin 1.5 gm IV x 1 in ED followed by vancomycin 1 gm IV Q8H, predicted trough 15 mcg/ml. Pharmacy will continue to follow and adjust as needed to maintain trough 15 to 20 mcg/ml.  Vd 54.2 L, ke 0.111 hr-1, T1/2 6.3 hr  Height: 5\' 6"  (167.6 cm) Weight: 232 lb 9.6 oz (105.5 kg) IBW/kg (Calculated) : 59.3  Temp (24hrs), Avg:100.3 F (37.9 C), Min:98 F (36.7 C), Max:103.1 F (39.5 C)   Recent Labs Lab 05/21/16 1453 05/21/16 1533  WBC 7.2  --   CREATININE 0.75  --   LATICACIDVEN  --  1.5    Estimated Creatinine Clearance: 128.6 mL/min (by C-G formula based on SCr of 0.75 mg/dL).    Allergies  Allergen Reactions  . Tomato Rash    Thank you for allowing pharmacy to be a part of this patient's care.  Carola FrostNathan A Shakeel Disney, Pharm.D., BCPS Clinical Pharmacist 05/22/2016 5:00 AM

## 2016-05-22 NOTE — ED Notes (Signed)
Transporting patient to 1C-124

## 2016-05-22 NOTE — Consult Note (Signed)
[facility] Infectious Disease     Reason for Consult:  Fever sepsis   Referring Physician: Ara Kussmaul, A Date of Admission:  05/21/2016   Active Problems:   Sepsis ([place])   HPI: [name] is a 30 y.o. female admitted with HA and fevers. On admit Temp 103.  BCx now + e coli. UCX with GNR. Had LP done with wbc 1, rbc 23, glucose 77, prot 19.   Flu PCR neg CT stone protocol done for R flank pain suggested mild fullness of collecting systems and ureters..   Past Medical History:  Diagnosis Date  . Diabetes mellitus without complication ([place])    before gastric sleeve procedure  . GERD (gastroesophageal reflux disease)   . Headache    when BP is up  . Hypertension   . Wears contact lenses    Past Surgical History:  Procedure Laterality Date  . CESAREAN SECTION  04/30/2009   decreased fetal heart tones due to umbilical cord wrapped  . CESAREAN SECTION    . CHOLECYSTECTOMY    . ESOPHAGOGASTRODUODENOSCOPY (EGD) WITH PROPOFOL N/A 10/24/2015   Procedure: ESOPHAGOGASTRODUODENOSCOPY (EGD) WITH PROPOFOL;  Surgeon: Lucilla Lame, MD;  Location: [facility];  Service: Endoscopy;  Laterality: N/A;  . LAPAROSCOPIC GASTRIC SLEEVE RESECTION N/A 03/19/2015   Procedure: LAPAROSCOPIC GASTRIC SLEEVE RESECTION;  Surgeon: Ladora Daniel, MD;  Location: [facility];  Service: General;  Laterality: N/A;   Social History  Substance Use Topics  . Smoking status: Never Smoker  . Smokeless tobacco: Never Used  . Alcohol use No     Comment: 1 drink/mo   Family History  Problem Relation Age of Onset  . Diabetes Father   . Hypertension Father   . Hyperlipidemia Father   . Hypertension Sister   . Birth defects Sister     fluid on brain cause blindness in B eye    Allergies:  Allergies  Allergen Reactions  . Tomato Rash    Current antibiotics: Antibiotics Given (last 72 hours)    Date/Time Action Medication Dose Rate   05/22/16 0545 Given   meropenem (MERREM) 2 g in sodium  chloride 0.9 % 100 mL IVPB 2 g 200 mL/hr   05/22/16 8119 Given   vancomycin (VANCOCIN) IVPB 1000 mg/200 mL premix 1,000 mg 200 mL/hr   05/22/16 1337 Given   meropenem (MERREM) 2 g in sodium chloride 0.9 % 100 mL IVPB 2 g 200 mL/hr      MEDICATIONS: . meropenem (MERREM) IV  2 g Intravenous Q8H  . sodium chloride flush  3 mL Intravenous Q12H    Review of Systems - 11 systems reviewed and negative per HPI   OBJECTIVE: Temp:  [98 F (36.7 C)-100.9 F (38.3 C)] 98.9 F (37.2 C) (12/08 1249) Pulse Rate:  [88-129] 95 (12/08 1249) Resp:  [16-45] 16 (12/08 0125) BP: (118-163)/(63-104) 155/101 (12/08 1249) SpO2:  [96 %-100 %] 100 % (12/08 1249) Weight:  [105.5 kg (232 lb 9.6 oz)] 105.5 kg (232 lb 9.6 oz) (12/08 0125) Physical Exam  Constitutional:  oriented to person, place, and time. Appears obese HENT: [place]/AT, PERRLA, no scleral icterus Mouth/Throat: Oropharynx is clear and moist. No oropharyngeal exudate.  Cardiovascular: Normal rate, regular rhythm and normal heart sounds. Exam reveals no gallop and no friction rub.  No murmur heard.  Pulmonary/Chest: Effort normal and breath sounds normal. No respiratory distress.  has no wheezes.  Neck = supple, no nuchal rigidity Abdominal: Soft. Bowel sounds are normal.  exhibits no distension.  There is no tenderness.  Lymphadenopathy: no cervical adenopathy. No axillary adenopathy Neurological: alert and oriented to person, place, and time.  Skin: Skin is warm and dry. No rash noted. No erythema.  Psychiatric: a normal mood and affect.  behavior is normal.    LABS: Results for orders placed or performed during the hospital encounter of 05/21/16 (from the past 48 hour(s))  Comprehensive metabolic panel     Status: Abnormal   Collection Time: 05/21/16  2:53 PM  Result Value Ref Range   Sodium 139 135 - 145 mmol/L   Potassium 3.3 (L) 3.5 - 5.1 mmol/L   Chloride 106 101 - 111 mmol/L   CO2 22 22 - 32 mmol/L   Glucose, Bld 119 (H) 65 - 99  mg/dL   BUN 9 6 - 20 mg/dL   Creatinine, Ser 0.75 0.44 - 1.00 mg/dL   Calcium 8.9 8.9 - 10.3 mg/dL   Total Protein 7.8 6.5 - 8.1 g/dL   Albumin 4.0 3.5 - 5.0 g/dL   AST 21 15 - 41 U/L   ALT 18 14 - 54 U/L   Alkaline Phosphatase 64 38 - 126 U/L   Total Bilirubin 0.8 0.3 - 1.2 mg/dL   GFR calc non Af Amer >60 >60 mL/min   GFR calc Af Amer >60 >60 mL/min    Comment: (NOTE) The eGFR has been calculated using the CKD EPI equation. This calculation has not been validated in all clinical situations. eGFR's persistently <60 mL/min signify possible Chronic Kidney Disease.    Anion gap 11 5 - 15  CBC WITH DIFFERENTIAL     Status: Abnormal   Collection Time: 05/21/16  2:53 PM  Result Value Ref Range   WBC 7.2 3.6 - 11.0 K/uL   RBC 4.94 3.80 - 5.20 MIL/uL   Hemoglobin 12.9 12.0 - 16.0 g/dL   HCT 37.8 35.0 - 47.0 %   MCV 76.6 (L) 80.0 - 100.0 fL   MCH 26.0 26.0 - 34.0 pg   MCHC 34.0 32.0 - 36.0 g/dL   RDW 13.9 11.5 - 14.5 %   Platelets 224 150 - 440 K/uL   Neutrophils Relative % 84 %   Neutro Abs 6.1 1.4 - 6.5 K/uL   Lymphocytes Relative 12 %   Lymphs Abs 0.9 (L) 1.0 - 3.6 K/uL   Monocytes Relative 2 %   Monocytes Absolute 0.2 0.2 - 0.9 K/uL   Eosinophils Relative 1 %   Eosinophils Absolute 0.1 0 - 0.7 K/uL   Basophils Relative 1 %   Basophils Absolute 0.0 0 - 0.1 K/uL  Blood Culture (routine x 2)     Status: None (Preliminary result)   Collection Time: 05/21/16  3:33 PM  Result Value Ref Range   Specimen Description BLOOD RIGHT ANTECUBITAL    Special Requests BOTTLES DRAWN AEROBIC AND ANAEROBIC AER8CC,ANA11CC    Culture  Setup Time      GRAM NEGATIVE RODS IN BOTH AEROBIC AND ANAEROBIC BOTTLES CRITICAL VALUE NOTED.  VALUE IS CONSISTENT WITH PREVIOUSLY REPORTED AND CALLED VALUE.    Culture GRAM NEGATIVE RODS    Report Status PENDING   Urine culture     Status: Abnormal (Preliminary result)   Collection Time: 05/21/16  3:33 PM  Result Value Ref Range   Specimen Description  URINE, RANDOM    Special Requests NONE    Culture 20,000 COLONIES/mL GRAM NEGATIVE RODS (A)    Report Status PENDING   Lactic acid, plasma     Status: None  Collection Time: 05/21/16  3:33 PM  Result Value Ref Range   Lactic Acid, Venous 1.5 0.5 - 1.9 mmol/L  Urinalysis, Complete w Microscopic     Status: Abnormal   Collection Time: 05/21/16  3:33 PM  Result Value Ref Range   Color, Urine STRAW (A) YELLOW   APPearance CLEAR (A) CLEAR   Specific Gravity, Urine 1.006 1.005 - 1.030   pH 6.0 5.0 - 8.0   Glucose, UA NEGATIVE NEGATIVE mg/dL   Hgb urine dipstick MODERATE (A) NEGATIVE   Bilirubin Urine NEGATIVE NEGATIVE   Ketones, ur NEGATIVE NEGATIVE mg/dL   Protein, ur NEGATIVE NEGATIVE mg/dL   Nitrite NEGATIVE NEGATIVE   Leukocytes, UA TRACE (A) NEGATIVE   RBC / HPF 6-30 0 - 5 RBC/hpf   WBC, UA 6-30 0 - 5 WBC/hpf   Bacteria, UA RARE (A) NONE SEEN   Squamous Epithelial / LPF 0-5 (A) NONE SEEN   Mucous PRESENT   Blood Culture (routine x 2)     Status: None (Preliminary result)   Collection Time: 05/21/16  3:34 PM  Result Value Ref Range   Specimen Description BLOOD LEFT ANTECUBITAL    Special Requests      BOTTLES DRAWN AEROBIC AND ANAEROBIC AER 12CC,ANA11CC   Culture  Setup Time      Organism ID to follow GRAM NEGATIVE RODS IN BOTH AEROBIC AND ANAEROBIC BOTTLES CRITICAL RESULT CALLED TO, READ BACK BY AND VERIFIED WITH: NATE COOKSON AT [place] ON 05/22/16 [place].    Culture GRAM NEGATIVE RODS    Report Status PENDING   Influenza panel by PCR (type A & B, H1N1)     Status: None   Collection Time: 05/21/16  3:34 PM  Result Value Ref Range   Influenza A By PCR NEGATIVE NEGATIVE   Influenza B By PCR NEGATIVE NEGATIVE    Comment: (NOTE) The Xpert Xpress Flu assay is intended as an aid in the diagnosis of  influenza and should not be used as a sole basis for treatment.  This  assay is FDA approved for nasopharyngeal swab specimens only. Nasal  washings and aspirates are unacceptable  for Xpert Xpress Flu testing.   Blood Culture ID Panel (Reflexed)     Status: Abnormal   Collection Time: 05/21/16  3:34 PM  Result Value Ref Range   Enterococcus species NOT DETECTED NOT DETECTED   Listeria monocytogenes NOT DETECTED NOT DETECTED   Staphylococcus species NOT DETECTED NOT DETECTED   Staphylococcus aureus NOT DETECTED NOT DETECTED   Streptococcus species NOT DETECTED NOT DETECTED   Streptococcus agalactiae NOT DETECTED NOT DETECTED   Streptococcus pneumoniae NOT DETECTED NOT DETECTED   Streptococcus pyogenes NOT DETECTED NOT DETECTED   Acinetobacter baumannii NOT DETECTED NOT DETECTED   Enterobacteriaceae species DETECTED (A) NOT DETECTED    Comment: CRITICAL RESULT CALLED TO, READ BACK BY AND VERIFIED WITH: NATE COOKSON AT 0448 ON 05/22/16 [place].    Enterobacter cloacae complex NOT DETECTED NOT DETECTED   Escherichia coli DETECTED (A) NOT DETECTED    Comment: CRITICAL RESULT CALLED TO, READ BACK BY AND VERIFIED WITH: NATE COOKSON AT 0448 ON 05/22/16 [place].    Klebsiella oxytoca NOT DETECTED NOT DETECTED   Klebsiella pneumoniae NOT DETECTED NOT DETECTED   Proteus species NOT DETECTED NOT DETECTED   Serratia marcescens NOT DETECTED NOT DETECTED   Carbapenem resistance NOT DETECTED NOT DETECTED   Haemophilus influenzae NOT DETECTED NOT DETECTED   Neisseria meningitidis NOT DETECTED NOT DETECTED   Pseudomonas aeruginosa NOT DETECTED  NOT DETECTED   Candida albicans NOT DETECTED NOT DETECTED   Candida glabrata NOT DETECTED NOT DETECTED   Candida krusei NOT DETECTED NOT DETECTED   Candida parapsilosis NOT DETECTED NOT DETECTED   Candida tropicalis NOT DETECTED NOT DETECTED  Pregnancy, urine POC     Status: None   Collection Time: 05/21/16  4:19 PM  Result Value Ref Range   Preg Test, Ur NEGATIVE NEGATIVE    Comment:        THE SENSITIVITY OF THIS METHODOLOGY IS >24 mIU/mL   CSF cell count with differential collection tube #: 1     Status: Abnormal   Collection Time:  05/21/16  9:40 PM  Result Value Ref Range   Tube # 1    Color, CSF COLORLESS COLORLESS   Appearance, CSF CLEAR CLEAR   RBC Count, CSF 23 (H) 0 - 3 /cu mm   WBC, CSF 1 0 - 5 /cu mm   Segmented Neutrophils-CSF 0 %   Lymphs, CSF 100 %   Monocyte-Macrophage-Spinal Fluid 0 %   Eosinophils, CSF 0 %   Other Cells, CSF 0   Gram stain     Status: None (Preliminary result)   Collection Time: 05/21/16  9:40 PM  Result Value Ref Range   Specimen Description CSF    Special Requests NONE    Gram Stain      NO ORGANISMS SEEN RARE WBCS CRITICAL RESULT CALLED TO, READ BACK BY AND VERIFIED WITH: ALLISON PATE ON 05/22/16 AT 0048 BY TLB    Report Status PENDING   Protein and glucose, CSF     Status: Abnormal   Collection Time: 05/21/16  9:40 PM  Result Value Ref Range   Glucose, CSF 77 (H) 40 - 70 mg/dL   Total  Protein, CSF 19 15 - 45 mg/dL  Basic metabolic panel     Status: Abnormal   Collection Time: 05/22/16  4:09 AM  Result Value Ref Range   Sodium 139 135 - 145 mmol/L   Potassium 3.4 (L) 3.5 - 5.1 mmol/L   Chloride 111 101 - 111 mmol/L   CO2 23 22 - 32 mmol/L   Glucose, Bld 131 (H) 65 - 99 mg/dL   BUN 6 6 - 20 mg/dL   Creatinine, Ser 0.50 0.44 - 1.00 mg/dL   Calcium 7.5 (L) 8.9 - 10.3 mg/dL   GFR calc non Af Amer >60 >60 mL/min   GFR calc Af Amer >60 >60 mL/min    Comment: (NOTE) The eGFR has been calculated using the CKD EPI equation. This calculation has not been validated in all clinical situations. eGFR's persistently <60 mL/min signify possible Chronic Kidney Disease.    Anion gap 5 5 - 15  CBC     Status: Abnormal   Collection Time: 05/22/16  4:09 AM  Result Value Ref Range   WBC 11.2 (H) 3.6 - 11.0 K/uL   RBC 4.10 3.80 - 5.20 MIL/uL   Hemoglobin 10.6 (L) 12.0 - 16.0 g/dL   HCT 32.0 (L) 35.0 - 47.0 %   MCV 78.0 (L) 80.0 - 100.0 fL   MCH 25.8 (L) 26.0 - 34.0 pg   MCHC 33.1 32.0 - 36.0 g/dL   RDW 14.0 11.5 - 14.5 %   Platelets 211 150 - 440 K/uL   No components  found for: ESR, C REACTIVE PROTEIN MICRO: Recent Results (from the past 720 hour(s))  Blood Culture (routine x 2)     Status: None (Preliminary result)  Collection Time: 05/21/16  3:33 PM  Result Value Ref Range Status   Specimen Description BLOOD RIGHT ANTECUBITAL  Final   Special Requests BOTTLES DRAWN AEROBIC AND ANAEROBIC AER8CC,ANA11CC  Final   Culture  Setup Time   Final    GRAM NEGATIVE RODS IN BOTH AEROBIC AND ANAEROBIC BOTTLES CRITICAL VALUE NOTED.  VALUE IS CONSISTENT WITH PREVIOUSLY REPORTED AND CALLED VALUE.    Culture GRAM NEGATIVE RODS  Final   Report Status PENDING  Incomplete  Urine culture     Status: Abnormal (Preliminary result)   Collection Time: 05/21/16  3:33 PM  Result Value Ref Range Status   Specimen Description URINE, RANDOM  Final   Special Requests NONE  Final   Culture 20,000 COLONIES/mL GRAM NEGATIVE RODS (A)  Final   Report Status PENDING  Incomplete  Blood Culture (routine x 2)     Status: None (Preliminary result)   Collection Time: 05/21/16  3:34 PM  Result Value Ref Range Status   Specimen Description BLOOD LEFT ANTECUBITAL  Final   Special Requests   Final    BOTTLES DRAWN AEROBIC AND ANAEROBIC AER 12CC,ANA11CC   Culture  Setup Time   Final    Organism ID to follow GRAM NEGATIVE RODS IN BOTH AEROBIC AND ANAEROBIC BOTTLES CRITICAL RESULT CALLED TO, READ BACK BY AND VERIFIED WITH: NATE COOKSON AT [place] ON 05/22/16 [place].    Culture GRAM NEGATIVE RODS  Final   Report Status PENDING  Incomplete  Blood Culture ID Panel (Reflexed)     Status: Abnormal   Collection Time: 05/21/16  3:34 PM  Result Value Ref Range Status   Enterococcus species NOT DETECTED NOT DETECTED Final   Listeria monocytogenes NOT DETECTED NOT DETECTED Final   Staphylococcus species NOT DETECTED NOT DETECTED Final   Staphylococcus aureus NOT DETECTED NOT DETECTED Final   Streptococcus species NOT DETECTED NOT DETECTED Final   Streptococcus agalactiae NOT DETECTED NOT DETECTED  Final   Streptococcus pneumoniae NOT DETECTED NOT DETECTED Final   Streptococcus pyogenes NOT DETECTED NOT DETECTED Final   Acinetobacter baumannii NOT DETECTED NOT DETECTED Final   Enterobacteriaceae species DETECTED (A) NOT DETECTED Final    Comment: CRITICAL RESULT CALLED TO, READ BACK BY AND VERIFIED WITH: NATE COOKSON AT 0448 ON 05/22/16 [place].    Enterobacter cloacae complex NOT DETECTED NOT DETECTED Final   Escherichia coli DETECTED (A) NOT DETECTED Final    Comment: CRITICAL RESULT CALLED TO, READ BACK BY AND VERIFIED WITH: NATE COOKSON AT 0448 ON 05/22/16 [place].    Klebsiella oxytoca NOT DETECTED NOT DETECTED Final   Klebsiella pneumoniae NOT DETECTED NOT DETECTED Final   Proteus species NOT DETECTED NOT DETECTED Final   Serratia marcescens NOT DETECTED NOT DETECTED Final   Carbapenem resistance NOT DETECTED NOT DETECTED Final   Haemophilus influenzae NOT DETECTED NOT DETECTED Final   Neisseria meningitidis NOT DETECTED NOT DETECTED Final   Pseudomonas aeruginosa NOT DETECTED NOT DETECTED Final   Candida albicans NOT DETECTED NOT DETECTED Final   Candida glabrata NOT DETECTED NOT DETECTED Final   Candida krusei NOT DETECTED NOT DETECTED Final   Candida parapsilosis NOT DETECTED NOT DETECTED Final   Candida tropicalis NOT DETECTED NOT DETECTED Final  Gram stain     Status: None (Preliminary result)   Collection Time: 05/21/16  9:40 PM  Result Value Ref Range Status   Specimen Description CSF  Final   Special Requests NONE  Final   Gram Stain   Final    NO  ORGANISMS SEEN RARE WBCS CRITICAL RESULT CALLED TO, READ BACK BY AND VERIFIED WITH: ALLISON PATE ON 05/22/16 AT 0048 BY TLB    Report Status PENDING  Incomplete    IMAGING: Dg Chest Port 1 View  Result Date: 05/21/2016 CLINICAL DATA:  30 year old presenting with fever and nausea. EXAM: PORTABLE CHEST 1 VIEW COMPARISON:  12/25/2014, 04/22/2013 and earlier. FINDINGS: Suboptimal inspiration accounts for crowded  bronchovascular markings, especially in the bases, and accentuates the cardiac silhouette. Taking this into account, cardiomediastinal silhouette normal and unchanged. Lungs clear. Bronchovascular markings normal. Pulmonary vascularity normal. No visible pleural effusions. No pneumothorax. IMPRESSION: Suboptimal inspiration.  No acute cardiopulmonary disease. Electronically Signed   By: Evangeline Dakin M.D.   On: 05/21/2016 15:27   Ct Renal Stone Study  Result Date: 05/21/2016 CLINICAL DATA:  Right flank pain starting today. EXAM: CT ABDOMEN AND PELVIS WITHOUT CONTRAST TECHNIQUE: Multidetector CT imaging of the abdomen and pelvis was performed following the standard protocol without IV contrast. COMPARISON:  04/05/2015 FINDINGS: Lower chest:  Unremarkable Hepatobiliary: No focal abnormality in the liver on this study without intravenous contrast. Gallbladder surgically absent. No intrahepatic or extrahepatic biliary dilation. Pancreas: No focal mass lesion. No dilatation of the main duct. No intraparenchymal cyst. No peripancreatic edema. Spleen: No splenomegaly. No focal mass lesion. Adrenals/Urinary Tract: No adrenal nodule or mass. No stones are seen in either kidney. There is mild fullness of both intrarenal collecting systems. Mild ureteral fullness is noted bilaterally. The urinary bladder appears normal for the degree of distention. Stomach/Bowel: Patient is status post gastric sleeve resection. Duodenum is normally positioned as is the ligament of Treitz. No small bowel wall thickening. No small bowel dilatation. The terminal ileum is normal. The appendix is normal. No gross colonic mass. No colonic wall thickening. No substantial diverticular change. Vascular/Lymphatic: No abdominal aortic aneurysm. No abdominal aortic atherosclerotic calcification. There is no gastrohepatic or hepatoduodenal ligament lymphadenopathy. No intraperitoneal or retroperitoneal lymphadenopathy. No pelvic sidewall  lymphadenopathy. Reproductive: The uterus has normal CT imaging appearance. There is no adnexal mass. Other: No intraperitoneal free fluid. Musculoskeletal: Bone windows reveal no worrisome lytic or sclerotic osseous lesions. IMPRESSION: 1. Mild fullness of the intrarenal collecting systems and ureters bilaterally without identifiable etiology. Features may be related to hydration status. Correlation for urinary tract infection may prove helpful. 2. Status post cholecystectomy. 3. Status post sleeve gastrectomy. Electronically Signed   By: Misty Stanley M.D.   On: 05/21/2016 17:48   Dg Lumbar Puncture Fluoro Guide  Result Date: 05/21/2016 CLINICAL DATA:  Fever, headache, sepsis EXAM: DIAGNOSTIC LUMBAR PUNCTURE UNDER FLUOROSCOPIC GUIDANCE FLUOROSCOPY TIME:  Fluoroscopy Time:  0.4 minute Radiation Exposure Index (if provided by the fluoroscopic device): 8.1 mGy Number of Acquired Spot Images: 1 PROCEDURE: Informed consent was obtained from the patient prior to the procedure, including potential complications of headache, allergy, and pain. With the patient prone, the lower back was prepped with Betadine. 1% Lidocaine was used for local anesthesia. Lumbar puncture was performed at the L3-4 level using a 22 gauge needle with return of clear CSF. 10 ml of CSF were obtained for laboratory studies and transported to the laboratory. The patient tolerated the procedure well, hemostasis was achieved and there were no apparent complications. IMPRESSION: Successful fluoroscopic guided lumbar puncture. Electronically Signed   By: Kathreen Devoid   On: 05/21/2016 22:14    Assessment:   [name] is a 30 y.o. female with E coli bacteremia likely from urinary source with possible collecting system fullness  bil on CT and UA with 6-30 wbc. Flu PCR neg, CSF neg.  I suspect she has pyelo as a source. She is clincially improving and can be dced once sensitivity available and clinically stable  Recommendations Dc droplet  precautions Continue meropenem until the  E coli sensitivity is back  Should be able to dc on an oral regimen for 14 days total abx based on sensitivity - usually could use batrim or ciprofloxacin. Please call me over weekend if help needed with seleting an oral option  Thank you very much for allowing me to participate in the care of this patient. Please call with questions.   Cheral Marker. Ola Spurr, MD

## 2016-05-22 NOTE — H&P (Signed)
[facility] - [place] [facility] Admission History and Physical [employer] Amed Datta, D.O.  ---------------------------------------------------------------------------------------------------------------------   PATIENT NAME: [name] MR#: [id number] DATE OF BIRTH: 1986/04/26 DATE OF ADMISSION: 05/21/2016 PRIMARY CARE PHYSICIAN: Kerby NoraAmy Bedsole, MD  REQUESTING/REFERRING PHYSICIAN: ED Dr. Don PerkingVeronese  CHIEF COMPLAINT: Chief Complaint  Patient presents with  . Headache    HISTORY OF PRESENT ILLNESS: [name] is a 30 y.o. female with a known history of Diabetes and hypertension prior to gastric bypass surgery 1 year ago now with no medical problems presents to the emergency department complaining of chills.  Patient was in a usual state of health until yesterday when she describes the sudden onset of shaking chills, severe bandlike headache, generalized body aches and low back pain. She also reports having had a nonproductive cough for the last week. LMP just ended.   Patient works in food services in the hospital and has had many sick contacts including a 28-year-old son with nonspecific symptoms.  Otherwise there has been no change in status. Patient has been taking medication as prescribed and there has been no recent change in medication or diet.  There has been no recent illness, travel.  Patient denies dizziness, chest pain, shortness of breath, N/V/C/D, abdominal pain, dysuria/frequency, changes in mental status.   EMS/ED COURSE:   Patient received cefepime, vancomycin, IV fluids and underwent LP.  PAST MEDICAL HISTORY: Past Medical History:  Diagnosis Date  . Diabetes mellitus without complication (HCC)    before gastric sleeve procedure  . GERD (gastroesophageal reflux disease)   . Headache    when BP is up  . Hypertension   . Wears contact lenses       PAST SURGICAL HISTORY: Past Surgical History:  Procedure Laterality Date  . CESAREAN SECTION  04/30/2009    decreased fetal heart tones due to umbilical cord wrapped  . CESAREAN SECTION    . CHOLECYSTECTOMY    . ESOPHAGOGASTRODUODENOSCOPY (EGD) WITH PROPOFOL N/A 10/24/2015   Procedure: ESOPHAGOGASTRODUODENOSCOPY (EGD) WITH PROPOFOL;  Surgeon: Midge Miniumarren Wohl, MD;  [facility];  Service: Endoscopy;  Laterality: N/A;  . LAPAROSCOPIC GASTRIC SLEEVE RESECTION N/A 03/19/2015   Procedure: LAPAROSCOPIC GASTRIC SLEEVE RESECTION;  Surgeon: Everette RankMichael A Tyner, MD;  Location: [facility];  Service: General;  Laterality: N/A;  Patient lost 126 pounds following laparoscopic gastric sleeve    SOCIAL HISTORY: Social History  Substance Use Topics  . Smoking status: Never Smoker  . Smokeless tobacco: Never Used  . Alcohol use No     Comment: 1 drink/mo  Patient denies alcohol tobacco and drug use    FAMILY HISTORY: Family History  Problem Relation Age of Onset  . Diabetes Father   . Hypertension Father   . Hyperlipidemia Father   . Hypertension Sister   . Birth defects Sister     fluid on brain cause blindness in B eye     MEDICATIONS AT HOME: Prior to Admission medications   Medication Sig Start Date End Date Taking? Authorizing Provider  etonogestrel (NEXPLANON) 68 MG IMPL implant 1 each by Subdermal route once.   Yes Historical Provider, MD      DRUG ALLERGIES: Allergies  Allergen Reactions  . Tomato Rash     REVIEW OF SYSTEMS: CONSTITUTIONAL: Positive fatigue, weakness, fever, chills, negative weight gain/loss, headache EYES: No blurry or double vision. ENT: No tinnitus, postnasal drip, redness or soreness of the oropharynx. RESPIRATORY: No dyspnea, positive cough, negative wheeze, hemoptysis. CARDIOVASCULAR: No chest pain, orthopnea, palpitations, syncope. GASTROINTESTINAL: No  nausea, vomiting, constipation, diarrhea, abdominal pain. No hematemesis, melena or hematochezia. GENITOURINARY: No dysuria, frequency, hematuria. ENDOCRINE: No polyuria or nocturia. No heat or cold  intolerance. HEMATOLOGY: No anemia, bruising, bleeding. INTEGUMENTARY: No rashes, ulcers, lesions. MUSCULOSKELETAL: No pain, arthritis, swelling, gout. Positive lower back pain NEUROLOGIC: No numbness, tingling, weakness or ataxia. No seizure-type activity. PSYCHIATRIC: No anxiety, depression, insomnia.  PHYSICAL EXAMINATION: VITAL SIGNS: Blood pressure 118/88, pulse (!) 103, temperature 98.2 F (36.8 C), resp. rate 20, height 5\' 6"  (1.676 m), weight 96.2 kg (212 lb), SpO2 97 %.  GENERAL: 30 y.o.-year-old black female patient, well-developed, well-nourished lying in the bed in no acute distress.  Pleasant and cooperative.   HEENT: Head atraumatic, normocephalic. Pupils equal, round, reactive to light and accommodation. No scleral icterus. Extraocular muscles intact. Oropharynx is clear. Mucus membranes moist. NECK: Supple, full range of motion. No JVD, no bruit heard. No cervical lymphadenopathy. CHEST: Normal breath sounds bilaterally. No wheezing, rales, rhonchi or crackles. No use of accessory muscles of respiration.  No reproducible chest wall tenderness.  CARDIOVASCULAR: S1, S2 normal. No murmurs, rubs, or gallops appreciated. Cap refill <2 seconds. ABDOMEN: Soft, nontender, nondistended. No rebound, guarding, rigidity. Normoactive bowel sounds present in all four quadrants. No organomegaly or mass. GU: No CVA or superpubic tenderness EXTREMITIES: Full range of motion. No pedal edema, cyanosis, or clubbing. NEUROLOGIC: Cranial nerves II through XII are grossly intact with no focal sensorimotor deficit. Muscle strength 5/5 in all extremities. Sensation intact. Gait not checked. PSYCHIATRIC: The patient is alert and oriented x 3. Normal affect, mood, thought content. SKIN: Warm, dry, and intact without obvious rash, lesion, or ulcer.  LABORATORY PANEL:  CBC  Recent Labs Lab 05/21/16 1453  WBC 7.2  HGB 12.9  HCT 37.8  PLT 224    ----------------------------------------------------------------------------------------------------------------- Chemistries  Recent Labs Lab 05/21/16 1453  NA 139  K 3.3*  CL 106  CO2 22  GLUCOSE 119*  BUN 9  CREATININE 0.75  CALCIUM 8.9  AST 21  ALT 18  ALKPHOS 64  BILITOT 0.8   ------------------------------------------------------------------------------------------------------------------ Cardiac Enzymes No results for input(s): TROPONINI in the last 168 hours. ------------------------------------------------------------------------------------------------------------------  RADIOLOGY: Dg Chest Port 1 View  Result Date: 05/21/2016 CLINICAL DATA:  30 year old presenting with fever and nausea. EXAM: PORTABLE CHEST 1 VIEW COMPARISON:  12/25/2014, 04/22/2013 and earlier. FINDINGS: Suboptimal inspiration accounts for crowded bronchovascular markings, especially in the bases, and accentuates the cardiac silhouette. Taking this into account, cardiomediastinal silhouette normal and unchanged. Lungs clear. Bronchovascular markings normal. Pulmonary vascularity normal. No visible pleural effusions. No pneumothorax. IMPRESSION: Suboptimal inspiration.  No acute cardiopulmonary disease. Electronically Signed   By: Hulan Saas M.D.   On: 05/21/2016 15:27   Ct Renal Stone Study  Result Date: 05/21/2016 CLINICAL DATA:  Right flank pain starting today. EXAM: CT ABDOMEN AND PELVIS WITHOUT CONTRAST TECHNIQUE: Multidetector CT imaging of the abdomen and pelvis was performed following the standard protocol without IV contrast. COMPARISON:  04/05/2015 FINDINGS: Lower chest:  Unremarkable Hepatobiliary: No focal abnormality in the liver on this study without intravenous contrast. Gallbladder surgically absent. No intrahepatic or extrahepatic biliary dilation. Pancreas: No focal mass lesion. No dilatation of the main duct. No intraparenchymal cyst. No peripancreatic edema. Spleen: No  splenomegaly. No focal mass lesion. Adrenals/Urinary Tract: No adrenal nodule or mass. No stones are seen in either kidney. There is mild fullness of both intrarenal collecting systems. Mild ureteral fullness is noted bilaterally. The urinary bladder appears normal for the degree of distention. Stomach/Bowel: Patient is  status post gastric sleeve resection. Duodenum is normally positioned as is the ligament of Treitz. No small bowel wall thickening. No small bowel dilatation. The terminal ileum is normal. The appendix is normal. No gross colonic mass. No colonic wall thickening. No substantial diverticular change. Vascular/Lymphatic: No abdominal aortic aneurysm. No abdominal aortic atherosclerotic calcification. There is no gastrohepatic or hepatoduodenal ligament lymphadenopathy. No intraperitoneal or retroperitoneal lymphadenopathy. No pelvic sidewall lymphadenopathy. Reproductive: The uterus has normal CT imaging appearance. There is no adnexal mass. Other: No intraperitoneal free fluid. Musculoskeletal: Bone windows reveal no worrisome lytic or sclerotic osseous lesions. IMPRESSION: 1. Mild fullness of the intrarenal collecting systems and ureters bilaterally without identifiable etiology. Features may be related to hydration status. Correlation for urinary tract infection may prove helpful. 2. Status post cholecystectomy. 3. Status post sleeve gastrectomy. Electronically Signed   By: Kennith CenterEric  Mansell M.D.   On: 05/21/2016 17:48   Dg Lumbar Puncture Fluoro Guide  Result Date: 05/21/2016 CLINICAL DATA:  Fever, headache, sepsis EXAM: DIAGNOSTIC LUMBAR PUNCTURE UNDER FLUOROSCOPIC GUIDANCE FLUOROSCOPY TIME:  Fluoroscopy Time:  0.4 minute Radiation Exposure Index (if provided by the fluoroscopic device): 8.1 mGy Number of Acquired Spot Images: 1 PROCEDURE: Informed consent was obtained from the patient prior to the procedure, including potential complications of headache, allergy, and pain. With the patient prone,  the lower back was prepped with Betadine. 1% Lidocaine was used for local anesthesia. Lumbar puncture was performed at the L3-4 level using a 22 gauge needle with return of clear CSF. 10 ml of CSF were obtained for laboratory studies and transported to the laboratory. The patient tolerated the procedure well, hemostasis was achieved and there were no apparent complications. IMPRESSION: Successful fluoroscopic guided lumbar puncture. Electronically Signed   By: Elige KoHetal  Patel   On: 05/21/2016 22:14    EKG: Sinus tachycardia 155 bpm with normal axis and nonspecific ST-T wave changes.   IMPRESSION AND PLAN:  This is a 30 y.o. female with a history of obesity status post gastric sleeve one year ago, history of diabetes and hypertension now resolved following bariatric surgery now being admitted with: 1. Sepsis, unclear source-rule out meningitis-admit for IV fluid hydration, follow-up blood, urine, sputum cultures & lumbar puncture results, pain control Continue cefepime and vancomycin. Droplet precautions until meningitis ruled out. Infectious disease consult requested. 2. Hypokalemia, mild-replace by mouth   Diet/Nutrition: Regular Fluids: IV normal saline DVT Px: SCDs and early ambulation Code Status: Full  All the records are reviewed and case discussed with ED provider. Management plans discussed with the patient and/or family who express understanding and agree with plan of care.   TOTAL TIME TAKING CARE OF THIS PATIENT: 60 minutes.   Qualyn Oyervides D.O. on 05/22/2016 at 1:09 AM Between 7am to 6pm - Pager - [phone] After 6pm go to [email or web address] - Social research officer, governmentpassword EPAS [facility] [place] [facility] Office [phone] CC: Primary care physician; Kerby NoraAmy Bedsole, MD     Note: This dictation was prepared with Dragon dictation along with smaller phrase technology. Any transcriptional errors that result from this process are unintentional.

## 2016-05-22 NOTE — Progress Notes (Signed)
[employer] -  at [facility]                                                                                                                                                                                  Patient Demographics   [name], is a 30 y.o. female, DOB - Jan 21, 1986, ZOX:[id number]  Admit date - 05/21/2016   Admitting Physician Tonye Royalty, DO  Outpatient Primary MD for the patient is Kerby Nora, MD   LOS - 1  Subjective: Continues to complain of headache. Fever have subsided. No nausea vomiting or diarrhea    Review of Systems:   CONSTITUTIONAL: No documented fever. No fatigue, weakness. No weight gain, no weight loss.  EYES: No blurry or double vision.  ENT: No tinnitus. No postnasal drip. No redness of the oropharynx.  RESPIRATORY: No cough, no wheeze, no hemoptysis. No dyspnea.  CARDIOVASCULAR: No chest pain. No orthopnea. No palpitations. No syncope.  GASTROINTESTINAL: No nausea, no vomiting or diarrhea. No abdominal pain. No melena or hematochezia.  GENITOURINARY: No dysuria or hematuria.  ENDOCRINE: No polyuria or nocturia. No heat or cold intolerance.  HEMATOLOGY: No anemia. No bruising. No bleeding.  INTEGUMENTARY: No rashes. No lesions.  MUSCULOSKELETAL: No arthritis. No swelling. No gout.  NEUROLOGIC: No numbness, tingling, or ataxia. No seizure-type activity. Positive headache PSYCHIATRIC: No anxiety. No insomnia. No ADD.    Vitals:   Vitals:   05/22/16 0000 05/22/16 0030 05/22/16 0125 05/22/16 1249  BP: 126/86 118/88 120/63 (!) 155/101  Pulse: (!) 102 (!) 103 88 95  Resp: (!) 28 20 16    Temp:   98 F (36.7 C) 98.9 F (37.2 C)  TempSrc:   Oral Oral  SpO2: 97% 97% 100% 100%  Weight:   232 lb 9.6 oz (105.5 kg)   Height:        Wt Readings from Last 3 Encounters:  05/22/16 232 lb 9.6 oz (105.5 kg)  04/14/16 206 lb (93.4 kg)  03/19/16 225 lb 4 oz (102.2 kg)     Intake/Output Summary (Last 24 hours) at  05/22/16 1253 Last data filed at 05/22/16 1136  Gross per 24 hour  Intake           1817.5 ml  Output                0 ml  Net           1817.5 ml    Physical Exam:   GENERAL: Pleasant-appearing in no apparent distress.  HEAD, EYES, EARS, NOSE AND THROAT: Atraumatic, normocephalic. Extraocular muscles are intact. Pupils equal and reactive to light. Sclerae anicteric. No conjunctival injection. No oro-pharyngeal erythema.  NECK: Supple. There is no jugular venous distention. No bruits, no lymphadenopathy, no thyromegaly.  HEART: Regular rate and rhythm,. No murmurs, no rubs, no clicks.  LUNGS: Clear to auscultation bilaterally. No rales or rhonchi. No wheezes.  ABDOMEN: Soft, flat, nontender, nondistended. Has good bowel sounds. No hepatosplenomegaly appreciated.  EXTREMITIES: No evidence of any cyanosis, clubbing, or peripheral edema.  +2 pedal and radial pulses bilaterally.  NEUROLOGIC: The patient is alert, awake, and oriented x3 with no focal motor or sensory deficits appreciated bilaterally.  SKIN: Moist and warm with no rashes appreciated.  Psych: Not anxious, depressed LN: No inguinal LN enlargement    Antibiotics   Anti-infectives    Start     Dose/Rate Route Frequency Ordered Stop   05/22/16 0600  meropenem (MERREM) 2 g in sodium chloride 0.9 % 100 mL IVPB     2 g 200 mL/hr over 30 Minutes Intravenous Every 8 hours 05/22/16 0459     05/22/16 0600  vancomycin (VANCOCIN) IVPB 1000 mg/200 mL premix     1,000 mg 200 mL/hr over 60 Minutes Intravenous Every 8 hours 05/22/16 0459     05/21/16 1515  ceFEPIme (MAXIPIME) 1 GM / 50mL IVPB premix     1 g 100 mL/hr over 30 Minutes Intravenous  Once 05/21/16 1514 05/21/16 1657   05/21/16 1515  vancomycin (VANCOCIN) 1,500 mg in sodium chloride 0.9 % 500 mL IVPB     1,500 mg 250 mL/hr over 120 Minutes Intravenous  Once 05/21/16 1514 05/21/16 1736      Medications   Scheduled Meds: . meropenem (MERREM) IV  2 g Intravenous Q8H  .  sodium chloride flush  3 mL Intravenous Q12H  . vancomycin  1,000 mg Intravenous Q8H   Continuous Infusions: . sodium chloride 150 mL/hr at 05/22/16 0828   PRN Meds:.acetaminophen **OR** acetaminophen, bisacodyl, HYDROcodone-acetaminophen, magnesium citrate, ondansetron **OR** ondansetron (ZOFRAN) IV, senna-docusate, zolpidem   Data Review:   Micro Results Recent Results (from the past 240 hour(s))  Blood Culture (routine x 2)     Status: None (Preliminary result)   Collection Time: 05/21/16  3:33 PM  Result Value Ref Range Status   Specimen Description BLOOD RIGHT ANTECUBITAL  Final   Special Requests BOTTLES DRAWN AEROBIC AND ANAEROBIC AER8CC,ANA11CC  Final   Culture  Setup Time   Final    GRAM NEGATIVE RODS IN BOTH AEROBIC AND ANAEROBIC BOTTLES CRITICAL VALUE NOTED.  VALUE IS CONSISTENT WITH PREVIOUSLY REPORTED AND CALLED VALUE.    Culture GRAM NEGATIVE RODS  Final   Report Status PENDING  Incomplete  Blood Culture (routine x 2)     Status: None (Preliminary result)   Collection Time: 05/21/16  3:34 PM  Result Value Ref Range Status   Specimen Description BLOOD LEFT ANTECUBITAL  Final   Special Requests   Final    BOTTLES DRAWN AEROBIC AND ANAEROBIC AER 12CC,ANA11CC   Culture  Setup Time   Final    Organism ID to follow GRAM NEGATIVE RODS IN BOTH AEROBIC AND ANAEROBIC BOTTLES CRITICAL RESULT CALLED TO, READ BACK BY AND VERIFIED WITH: NATE COOKSON AT 0448 ON 05/22/16 [facility].    Culture GRAM NEGATIVE RODS  Final   Report Status PENDING  Incomplete  Blood Culture ID Panel (Reflexed)     Status: Abnormal   Collection Time: 05/21/16  3:34 PM  Result Value Ref Range Status   Enterococcus species NOT DETECTED NOT DETECTED Final   Listeria monocytogenes NOT DETECTED NOT DETECTED Final   Staphylococcus  species NOT DETECTED NOT DETECTED Final   Staphylococcus aureus NOT DETECTED NOT DETECTED Final   Streptococcus species NOT DETECTED NOT DETECTED Final   Streptococcus agalactiae  NOT DETECTED NOT DETECTED Final   Streptococcus pneumoniae NOT DETECTED NOT DETECTED Final   Streptococcus pyogenes NOT DETECTED NOT DETECTED Final   Acinetobacter baumannii NOT DETECTED NOT DETECTED Final   Enterobacteriaceae species DETECTED (A) NOT DETECTED Final    Comment: CRITICAL RESULT CALLED TO, READ BACK BY AND VERIFIED WITH: NATE COOKSON AT 0448 ON 05/22/16 [facility].    Enterobacter cloacae complex NOT DETECTED NOT DETECTED Final   Escherichia coli DETECTED (A) NOT DETECTED Final    Comment: CRITICAL RESULT CALLED TO, READ BACK BY AND VERIFIED WITH: NATE COOKSON AT 0448 ON 05/22/16 [facility].    Klebsiella oxytoca NOT DETECTED NOT DETECTED Final   Klebsiella pneumoniae NOT DETECTED NOT DETECTED Final   Proteus species NOT DETECTED NOT DETECTED Final   Serratia marcescens NOT DETECTED NOT DETECTED Final   Carbapenem resistance NOT DETECTED NOT DETECTED Final   Haemophilus influenzae NOT DETECTED NOT DETECTED Final   Neisseria meningitidis NOT DETECTED NOT DETECTED Final   Pseudomonas aeruginosa NOT DETECTED NOT DETECTED Final   Candida albicans NOT DETECTED NOT DETECTED Final   Candida glabrata NOT DETECTED NOT DETECTED Final   Candida krusei NOT DETECTED NOT DETECTED Final   Candida parapsilosis NOT DETECTED NOT DETECTED Final   Candida tropicalis NOT DETECTED NOT DETECTED Final  Gram stain     Status: None (Preliminary result)   Collection Time: 05/21/16  9:40 PM  Result Value Ref Range Status   Specimen Description CSF  Final   Special Requests NONE  Final   Gram Stain   Final    NO ORGANISMS SEEN RARE WBCS CRITICAL RESULT CALLED TO, READ BACK BY AND VERIFIED WITH: ALLISON PATE ON 05/22/16 AT 0048 BY TLB    Report Status PENDING  Incomplete    Radiology Reports Dg Chest Port 1 View  Result Date: 05/21/2016 CLINICAL DATA:  30 year old presenting with fever and nausea. EXAM: PORTABLE CHEST 1 VIEW COMPARISON:  12/25/2014, 04/22/2013 and earlier. FINDINGS: Suboptimal inspiration  accounts for crowded bronchovascular markings, especially in the bases, and accentuates the cardiac silhouette. Taking this into account, cardiomediastinal silhouette normal and unchanged. Lungs clear. Bronchovascular markings normal. Pulmonary vascularity normal. No visible pleural effusions. No pneumothorax. IMPRESSION: Suboptimal inspiration.  No acute cardiopulmonary disease. Electronically Signed   By: Hulan Saas M.D.   On: 05/21/2016 15:27   Ct Renal Stone Study  Result Date: 05/21/2016 CLINICAL DATA:  Right flank pain starting today. EXAM: CT ABDOMEN AND PELVIS WITHOUT CONTRAST TECHNIQUE: Multidetector CT imaging of the abdomen and pelvis was performed following the standard protocol without IV contrast. COMPARISON:  04/05/2015 FINDINGS: Lower chest:  Unremarkable Hepatobiliary: No focal abnormality in the liver on this study without intravenous contrast. Gallbladder surgically absent. No intrahepatic or extrahepatic biliary dilation. Pancreas: No focal mass lesion. No dilatation of the main duct. No intraparenchymal cyst. No peripancreatic edema. Spleen: No splenomegaly. No focal mass lesion. Adrenals/Urinary Tract: No adrenal nodule or mass. No stones are seen in either kidney. There is mild fullness of both intrarenal collecting systems. Mild ureteral fullness is noted bilaterally. The urinary bladder appears normal for the degree of distention. Stomach/Bowel: Patient is status post gastric sleeve resection. Duodenum is normally positioned as is the ligament of Treitz. No small bowel wall thickening. No small bowel dilatation. The terminal ileum is normal. The appendix is normal. No  gross colonic mass. No colonic wall thickening. No substantial diverticular change. Vascular/Lymphatic: No abdominal aortic aneurysm. No abdominal aortic atherosclerotic calcification. There is no gastrohepatic or hepatoduodenal ligament lymphadenopathy. No intraperitoneal or retroperitoneal lymphadenopathy. No pelvic  sidewall lymphadenopathy. Reproductive: The uterus has normal CT imaging appearance. There is no adnexal mass. Other: No intraperitoneal free fluid. Musculoskeletal: Bone windows reveal no worrisome lytic or sclerotic osseous lesions. IMPRESSION: 1. Mild fullness of the intrarenal collecting systems and ureters bilaterally without identifiable etiology. Features may be related to hydration status. Correlation for urinary tract infection may prove helpful. 2. Status post cholecystectomy. 3. Status post sleeve gastrectomy. Electronically Signed   By: Kennith Center M.D.   On: 05/21/2016 17:48   Dg Lumbar Puncture Fluoro Guide  Result Date: 05/21/2016 CLINICAL DATA:  Fever, headache, sepsis EXAM: DIAGNOSTIC LUMBAR PUNCTURE UNDER FLUOROSCOPIC GUIDANCE FLUOROSCOPY TIME:  Fluoroscopy Time:  0.4 minute Radiation Exposure Index (if provided by the fluoroscopic device): 8.1 mGy Number of Acquired Spot Images: 1 PROCEDURE: Informed consent was obtained from the patient prior to the procedure, including potential complications of headache, allergy, and pain. With the patient prone, the lower back was prepped with Betadine. 1% Lidocaine was used for local anesthesia. Lumbar puncture was performed at the L3-4 level using a 22 gauge needle with return of clear CSF. 10 ml of CSF were obtained for laboratory studies and transported to the laboratory. The patient tolerated the procedure well, hemostasis was achieved and there were no apparent complications. IMPRESSION: Successful fluoroscopic guided lumbar puncture. Electronically Signed   By: Elige Ko   On: 05/21/2016 22:14     CBC  Recent Labs Lab 05/21/16 1453 05/22/16 0409  WBC 7.2 11.2*  HGB 12.9 10.6*  HCT 37.8 32.0*  PLT 224 211  MCV 76.6* 78.0*  MCH 26.0 25.8*  MCHC 34.0 33.1  RDW 13.9 14.0  LYMPHSABS 0.9*  --   MONOABS 0.2  --   EOSABS 0.1  --   BASOSABS 0.0  --     Chemistries   Recent Labs Lab 05/21/16 1453 05/22/16 0409  NA 139 139  K  3.3* 3.4*  CL 106 111  CO2 22 23  GLUCOSE 119* 131*  BUN 9 6  CREATININE 0.75 0.50  CALCIUM 8.9 7.5*  AST 21  --   ALT 18  --   ALKPHOS 64  --   BILITOT 0.8  --    ------------------------------------------------------------------------------------------------------------------ estimated creatinine clearance is 128.6 mL/min (by C-G formula based on SCr of 0.5 mg/dL). ------------------------------------------------------------------------------------------------------------------ No results for input(s): HGBA1C in the last 72 hours. ------------------------------------------------------------------------------------------------------------------ No results for input(s): CHOL, HDL, LDLCALC, TRIG, CHOLHDL, LDLDIRECT in the last 72 hours. ------------------------------------------------------------------------------------------------------------------ No results for input(s): TSH, T4TOTAL, T3FREE, THYROIDAB in the last 72 hours.  Invalid input(s): FREET3 ------------------------------------------------------------------------------------------------------------------ No results for input(s): VITAMINB12, FOLATE, FERRITIN, TIBC, IRON, RETICCTPCT in the last 72 hours.  Coagulation profile No results for input(s): INR, PROTIME in the last 168 hours.  No results for input(s): DDIMER in the last 72 hours.  Cardiac Enzymes No results for input(s): CKMB, TROPONINI, MYOGLOBIN in the last 168 hours.  Invalid input(s): CK ------------------------------------------------------------------------------------------------------------------ Invalid input(s): POCBNP    Assessment & Plan   Patient is a 30 year old African-American female admitted with sepsis  1. Sepsis due to gram-negative rods Continue IV anabiotic's infectious disease consult will be obtained, source is unclear  2. Headache Possible due to viral meningitis supportive care  3. Hypokalemia replace will recheck in the  morning  Code Status Orders        Start     Ordered   05/22/16 0122  Full code  Continuous     05/22/16 0121    Code Status History    Date Active Date Inactive Code Status Order ID Comments User Context   03/19/2015  5:36 PM 03/20/2015  6:18 PM Full Code [id number]  Lilla Shookon M Drinkwater, PA-C Inpatient           Consults  id   DVT Prophylaxis  Lovenox  Lab Results  Component Value Date   PLT 211 05/22/2016     Time Spent in minutes 35min  Greater than 50% of time spent in care coordination and counseling patient regarding the condition and plan of care.   Auburn BilberryPATEL, Artemio Dobie M.D on 05/22/2016 at 12:53 PM  Between 7am to 6pm - Pager - [phone]  After 6pm go to [email or web address] - password [email or web address] [facility]  [facility]   Office  [phone]

## 2016-05-23 LAB — BASIC METABOLIC PANEL
ANION GAP: 5 (ref 5–15)
CHLORIDE: 110 mmol/L (ref 101–111)
CO2: 24 mmol/L (ref 22–32)
Calcium: 8.4 mg/dL — ABNORMAL LOW (ref 8.9–10.3)
Creatinine, Ser: 0.53 mg/dL (ref 0.44–1.00)
Glucose, Bld: 99 mg/dL (ref 65–99)
POTASSIUM: 3.7 mmol/L (ref 3.5–5.1)
SODIUM: 139 mmol/L (ref 135–145)

## 2016-05-23 LAB — CBC
HCT: 33.2 % — ABNORMAL LOW (ref 35.0–47.0)
HEMOGLOBIN: 11.4 g/dL — AB (ref 12.0–16.0)
MCH: 26.5 pg (ref 26.0–34.0)
MCHC: 34.3 g/dL (ref 32.0–36.0)
MCV: 77.1 fL — ABNORMAL LOW (ref 80.0–100.0)
PLATELETS: 219 10*3/uL (ref 150–440)
RBC: 4.31 MIL/uL (ref 3.80–5.20)
RDW: 14.2 % (ref 11.5–14.5)
WBC: 6.7 10*3/uL (ref 3.6–11.0)

## 2016-05-23 LAB — URINE CULTURE: Culture: 20000 — AB

## 2016-05-23 MED ORDER — DEXTROSE 5 % IV SOLN
2.0000 g | INTRAVENOUS | Status: DC
  Filled 2016-05-23: qty 2

## 2016-05-23 MED ORDER — CEFTRIAXONE SODIUM-DEXTROSE 2-2.22 GM-% IV SOLR
2.0000 g | INTRAVENOUS | Status: DC
  Administered 2016-05-23: 2 g via INTRAVENOUS
  Filled 2016-05-23 (×2): qty 50

## 2016-05-23 MED ORDER — PROMETHAZINE HCL 25 MG/ML IJ SOLN: 12.5000 mg | Freq: Once | INTRAMUSCULAR | Status: DC

## 2016-05-23 MED ORDER — KETOROLAC TROMETHAMINE 15 MG/ML IJ SOLN
15.0000 mg | Freq: Four times a day (QID) | INTRAMUSCULAR | Status: DC | PRN
  Administered 2016-05-23 – 2016-05-24 (×2): 15 mg via INTRAVENOUS
  Filled 2016-05-23 (×2): qty 1

## 2016-05-23 MED ORDER — MAGNESIUM SULFATE 2 GM/50ML IV SOLN
2.0000 g | Freq: Once | INTRAVENOUS | Status: AC
  Administered 2016-05-23: 2 g via INTRAVENOUS
  Filled 2016-05-23: qty 50

## 2016-05-23 MED ORDER — KETOROLAC TROMETHAMINE 30 MG/ML IJ SOLN
30.0000 mg | Freq: Once | INTRAMUSCULAR | Status: AC
  Administered 2016-05-23: 30 mg via INTRAVENOUS
  Filled 2016-05-23: qty 1

## 2016-05-23 NOTE — Progress Notes (Signed)
Patient ID: [name], female   DOB: 19-Oct-1985, 30 y.o.   MRN: [id number]  [facility] PROGRESS NOTE  [name] WER:[id number] DOB: 04/28/1986 DOA: 05/21/2016 PCP: Kerby Nora, MD  HPI/Subjective: Patients major complaint is headache. Patient does not have an appetite. Positive for nausea after I left the room. No abdominal pain.  Objective: Vitals:   05/22/16 1942 05/23/16 0420  BP: 135/88 (!) 133/91  Pulse: 98 91  Resp: 19 17  Temp: 98.9 F (37.2 C) 98.8 F (37.1 C)    Filed Weights   05/21/16 1454 05/22/16 0125  Weight: 96.2 kg (212 lb) 105.5 kg (232 lb 9.6 oz)    ROS: Review of Systems  Constitutional: Negative for chills and fever.  Eyes: Negative for blurred vision.  Respiratory: Negative for cough and shortness of breath.   Cardiovascular: Negative for chest pain.  Gastrointestinal: Positive for nausea. Negative for abdominal pain, constipation, diarrhea and vomiting.  Genitourinary: Negative for dysuria.  Musculoskeletal: Negative for joint pain.  Neurological: Negative for dizziness and headaches.   Exam: Physical Exam  Constitutional: She is oriented to person, place, and time.  HENT:  Nose: No mucosal edema.  Mouth/Throat: No oropharyngeal exudate or posterior oropharyngeal edema.  Eyes: Conjunctivae, EOM and lids are normal. Pupils are equal, round, and reactive to light.  Some light photosensitivity  Neck: No JVD present. Carotid bruit is not present. No edema present. No thyroid mass and no thyromegaly present.  Cardiovascular: S1 normal and S2 normal.  Exam reveals no gallop.   No murmur heard. Pulses:      Dorsalis pedis pulses are 2+ on the right side, and 2+ on the left side.  Respiratory: No respiratory distress. She has no wheezes. She has no rhonchi. She has no rales.  GI: Soft. Bowel sounds are normal. There is no tenderness.  Musculoskeletal:       Right ankle: She exhibits swelling.       Left ankle: She exhibits swelling.   Lymphadenopathy:    She has no cervical adenopathy.  Neurological: She is alert and oriented to person, place, and time. No cranial nerve deficit.  Skin: Skin is warm. Nails show no clubbing.  Chronic lower extremity skin discoloration bilateral shins  Psychiatric: She has a normal mood and affect.      Data Reviewed: Basic Metabolic Panel:  Recent Labs Lab 05/21/16 1453 05/22/16 0409 05/23/16 0414  NA 139 139 139  K 3.3* 3.4* 3.7  CL 106 111 110  CO2 22 23 24   GLUCOSE 119* 131* 99  BUN 9 6 <5*  CREATININE 0.75 0.50 0.53  CALCIUM 8.9 7.5* 8.4*   Liver Function Tests:  Recent Labs Lab 05/21/16 1453  AST 21  ALT 18  ALKPHOS 64  BILITOT 0.8  PROT 7.8  ALBUMIN 4.0   CBC:  Recent Labs Lab 05/21/16 1453 05/22/16 0409 05/23/16 0414  WBC 7.2 11.2* 6.7  NEUTROABS 6.1  --   --   HGB 12.9 10.6* 11.4*  HCT 37.8 32.0* 33.2*  MCV 76.6* 78.0* 77.1*  PLT 224 211 219     Recent Results (from the past 240 hour(s))  Blood Culture (routine x 2)     Status: None (Preliminary result)   Collection Time: 05/21/16  3:33 PM  Result Value Ref Range Status   Specimen Description BLOOD RIGHT ANTECUBITAL  Final   Special Requests BOTTLES DRAWN AEROBIC AND ANAEROBIC [facility]  Final   Culture  Setup Time   Final  GRAM NEGATIVE RODS IN BOTH AEROBIC AND ANAEROBIC BOTTLES CRITICAL VALUE NOTED.  VALUE IS CONSISTENT WITH PREVIOUSLY REPORTED AND CALLED VALUE.    Culture GRAM NEGATIVE RODS  Final   Report Status PENDING  Incomplete  Urine culture     Status: Abnormal   Collection Time: 05/21/16  3:33 PM  Result Value Ref Range Status   Specimen Description URINE, RANDOM  Final   Special Requests NONE  Final   Culture 20,000 COLONIES/mL ESCHERICHIA COLI (A)  Final   Report Status 05/23/2016 FINAL  Final   Organism ID, Bacteria ESCHERICHIA COLI (A)  Final      Susceptibility   Escherichia coli - MIC*    AMPICILLIN 4 SENSITIVE Sensitive     CEFAZOLIN <=4 SENSITIVE  Sensitive     CEFTRIAXONE <=1 SENSITIVE Sensitive     CIPROFLOXACIN <=0.25 SENSITIVE Sensitive     GENTAMICIN <=1 SENSITIVE Sensitive     IMIPENEM <=0.25 SENSITIVE Sensitive     NITROFURANTOIN <=16 SENSITIVE Sensitive     TRIMETH/SULFA <=20 SENSITIVE Sensitive     AMPICILLIN/SULBACTAM <=2 SENSITIVE Sensitive     PIP/TAZO <=4 SENSITIVE Sensitive     Extended ESBL NEGATIVE Sensitive     * 20,000 COLONIES/mL ESCHERICHIA COLI  Blood Culture (routine x 2)     Status: Abnormal (Preliminary result)   Collection Time: 05/21/16  3:34 PM  Result Value Ref Range Status   Specimen Description BLOOD LEFT ANTECUBITAL  Final   Special Requests   Final    BOTTLES DRAWN AEROBIC AND ANAEROBIC AER 12CC,ANA11CC   Culture  Setup Time   Final    Organism ID to follow GRAM NEGATIVE RODS IN BOTH AEROBIC AND ANAEROBIC BOTTLES CRITICAL RESULT CALLED TO, READ BACK BY AND VERIFIED WITH: NATE COOKSON AT 0448 ON 05/22/16 [facility].    Culture (A)  Final    ESCHERICHIA COLI SUSCEPTIBILITIES TO FOLLOW Performed at [facility]    Report Status PENDING  Incomplete  Blood Culture ID Panel (Reflexed)     Status: Abnormal   Collection Time: 05/21/16  3:34 PM  Result Value Ref Range Status   Enterococcus species NOT DETECTED NOT DETECTED Final   Listeria monocytogenes NOT DETECTED NOT DETECTED Final   Staphylococcus species NOT DETECTED NOT DETECTED Final   Staphylococcus aureus NOT DETECTED NOT DETECTED Final   Streptococcus species NOT DETECTED NOT DETECTED Final   Streptococcus agalactiae NOT DETECTED NOT DETECTED Final   Streptococcus pneumoniae NOT DETECTED NOT DETECTED Final   Streptococcus pyogenes NOT DETECTED NOT DETECTED Final   Acinetobacter baumannii NOT DETECTED NOT DETECTED Final   Enterobacteriaceae species DETECTED (A) NOT DETECTED Final    Comment: CRITICAL RESULT CALLED TO, READ BACK BY AND VERIFIED WITH: NATE COOKSON AT 0448 ON 05/22/16 [facility].    Enterobacter cloacae complex NOT DETECTED NOT  DETECTED Final   Escherichia coli DETECTED (A) NOT DETECTED Final    Comment: CRITICAL RESULT CALLED TO, READ BACK BY AND VERIFIED WITH: NATE COOKSON AT 0448 ON 05/22/16 [facility].    Klebsiella oxytoca NOT DETECTED NOT DETECTED Final   Klebsiella pneumoniae NOT DETECTED NOT DETECTED Final   Proteus species NOT DETECTED NOT DETECTED Final   Serratia marcescens NOT DETECTED NOT DETECTED Final   Carbapenem resistance NOT DETECTED NOT DETECTED Final   Haemophilus influenzae NOT DETECTED NOT DETECTED Final   Neisseria meningitidis NOT DETECTED NOT DETECTED Final   Pseudomonas aeruginosa NOT DETECTED NOT DETECTED Final   Candida albicans NOT DETECTED NOT DETECTED Final  Candida glabrata NOT DETECTED NOT DETECTED Final   Candida krusei NOT DETECTED NOT DETECTED Final   Candida parapsilosis NOT DETECTED NOT DETECTED Final   Candida tropicalis NOT DETECTED NOT DETECTED Final  Gram stain     Status: None (Preliminary result)   Collection Time: 05/21/16  9:40 PM  Result Value Ref Range Status   Specimen Description CSF  Final   Special Requests NONE  Final   Gram Stain   Final    NO ORGANISMS SEEN RARE WBCS CRITICAL RESULT CALLED TO, READ BACK BY AND VERIFIED WITH: ALLISON PATE ON 05/22/16 AT 0048 BY TLB    Report Status PENDING  Incomplete     Studies: Dg Chest Port 1 View  Result Date: 05/21/2016 CLINICAL DATA:  30 year old presenting with fever and nausea. EXAM: PORTABLE CHEST 1 VIEW COMPARISON:  12/25/2014, 04/22/2013 and earlier. FINDINGS: Suboptimal inspiration accounts for crowded bronchovascular markings, especially in the bases, and accentuates the cardiac silhouette. Taking this into account, cardiomediastinal silhouette normal and unchanged. Lungs clear. Bronchovascular markings normal. Pulmonary vascularity normal. No visible pleural effusions. No pneumothorax. IMPRESSION: Suboptimal inspiration.  No acute cardiopulmonary disease. Electronically Signed   By: Hulan Saashomas  Lawrence M.D.   On:  05/21/2016 15:27   Ct Renal Stone Study  Result Date: 05/21/2016 CLINICAL DATA:  Right flank pain starting today. EXAM: CT ABDOMEN AND PELVIS WITHOUT CONTRAST TECHNIQUE: Multidetector CT imaging of the abdomen and pelvis was performed following the standard protocol without IV contrast. COMPARISON:  04/05/2015 FINDINGS: Lower chest:  Unremarkable Hepatobiliary: No focal abnormality in the liver on this study without intravenous contrast. Gallbladder surgically absent. No intrahepatic or extrahepatic biliary dilation. Pancreas: No focal mass lesion. No dilatation of the main duct. No intraparenchymal cyst. No peripancreatic edema. Spleen: No splenomegaly. No focal mass lesion. Adrenals/Urinary Tract: No adrenal nodule or mass. No stones are seen in either kidney. There is mild fullness of both intrarenal collecting systems. Mild ureteral fullness is noted bilaterally. The urinary bladder appears normal for the degree of distention. Stomach/Bowel: Patient is status post gastric sleeve resection. Duodenum is normally positioned as is the ligament of Treitz. No small bowel wall thickening. No small bowel dilatation. The terminal ileum is normal. The appendix is normal. No gross colonic mass. No colonic wall thickening. No substantial diverticular change. Vascular/Lymphatic: No abdominal aortic aneurysm. No abdominal aortic atherosclerotic calcification. There is no gastrohepatic or hepatoduodenal ligament lymphadenopathy. No intraperitoneal or retroperitoneal lymphadenopathy. No pelvic sidewall lymphadenopathy. Reproductive: The uterus has normal CT imaging appearance. There is no adnexal mass. Other: No intraperitoneal free fluid. Musculoskeletal: Bone windows reveal no worrisome lytic or sclerotic osseous lesions. IMPRESSION: 1. Mild fullness of the intrarenal collecting systems and ureters bilaterally without identifiable etiology. Features may be related to hydration status. Correlation for urinary tract  infection may prove helpful. 2. Status post cholecystectomy. 3. Status post sleeve gastrectomy. Electronically Signed   By: Kennith CenterEric  Mansell M.D.   On: 05/21/2016 17:48   Dg Lumbar Puncture Fluoro Guide  Result Date: 05/21/2016 CLINICAL DATA:  Fever, headache, sepsis EXAM: DIAGNOSTIC LUMBAR PUNCTURE UNDER FLUOROSCOPIC GUIDANCE FLUOROSCOPY TIME:  Fluoroscopy Time:  0.4 minute Radiation Exposure Index (if provided by the fluoroscopic device): 8.1 mGy Number of Acquired Spot Images: 1 PROCEDURE: Informed consent was obtained from the patient prior to the procedure, including potential complications of headache, allergy, and pain. With the patient prone, the lower back was prepped with Betadine. 1% Lidocaine was used for local anesthesia. Lumbar puncture was performed at the L3-4 level  using a 22 gauge needle with return of clear CSF. 10 ml of CSF were obtained for laboratory studies and transported to the laboratory. The patient tolerated the procedure well, hemostasis was achieved and there were no apparent complications. IMPRESSION: Successful fluoroscopic guided lumbar puncture. Electronically Signed   By: Elige KoHetal  Patel   On: 05/21/2016 22:14    Scheduled Meds: . cefTRIAXone (ROCEPHIN)  IV  2 g Intravenous Q24H  . ketorolac  30 mg Intravenous Once  . magnesium sulfate 1 - 4 g bolus IVPB  2 g Intravenous Once  . sodium chloride flush  3 mL Intravenous Q12H   Continuous Infusions: . sodium chloride 150 mL/hr at 05/23/16 0521    Assessment/Plan:  1. Sepsis with Escherichia coli in urine culture and blood culture. The urine culture only grew out 20,000 Escherichia coli but the CT scan showed mild fullness of the collecting system. I think the urine is the source of the sepsis. The urine culture sensitivities are pansensitive. Switch antibiotics over to Rocephin for now. Blood culture sensitivity should be back tomorrow morning or late tonight. 2. Headache. This has been going on since she came in. Lumbar  puncture was negative. Try Toradol and see if that helps. Give IV magnesium also. If no response to these may try steroids. 3. History of type 2 diabetes prior to gastric sleeve procedure. 4. Morbid obesity 5. Vitamin B12 deficiency on previous labs back in October 6. Nausea. When necessary Zofran  Code Status:     Code Status Orders        Start     Ordered   05/22/16 0122  Full code  Continuous     05/22/16 0121    Code Status History    Date Active Date Inactive Code Status Order ID Comments User Context   03/19/2015  5:36 PM 03/20/2015  6:18 PM Full Code [id number]  Lilla Shookon M Drinkwater, PA-C Inpatient      Disposition Plan: Assess daily on whether she is well enough to go home  Consultants:  Infectious disease  Antibiotics:  Switch antibiotics to Rocephin  Time spent: 25 minutes  Alford HighlandWIETING, Meia Emley  [employer]

## 2016-05-23 NOTE — Progress Notes (Signed)
Patient spent day in room, cuddled in bed most of the day. IVF and antibiotics to regime. Up to void without problems. VS stable throughout the day. Only complaint was of headache -patient medicated per medication orders. No distress noted.

## 2016-05-24 LAB — CULTURE, BLOOD (ROUTINE X 2)

## 2016-05-24 MED ORDER — DEXTROSE 5 % IV SOLN
2.0000 g | Freq: Once | INTRAVENOUS | Status: AC
  Administered 2016-05-24: 2 g via INTRAVENOUS
  Filled 2016-05-24: qty 2

## 2016-05-24 MED ORDER — BUTALBITAL-APAP-CAFFEINE 50-325-40 MG PO TABS
1.0000 | ORAL_TABLET | Freq: Once | ORAL | Status: AC
  Administered 2016-05-24: 10:00:00 1 via ORAL
  Filled 2016-05-24: qty 1

## 2016-05-24 MED ORDER — CEPHALEXIN 500 MG PO CAPS: 500.0000 mg | ORAL_CAPSULE | Freq: Three times a day (TID) | ORAL | 0 refills | Status: DC

## 2016-05-24 MED ORDER — BUTALBITAL-APAP-CAFFEINE 50-325-40 MG PO TABS: 1.0000 | ORAL_TABLET | Freq: Four times a day (QID) | ORAL | 0 refills | Status: DC | PRN

## 2016-05-24 NOTE — Discharge Instructions (Signed)
Sepsis, Adult Sepsis is a serious infection of your blood or tissues that affects your whole body. The infection that causes sepsis may be bacterial, viral, fungal, or parasitic. Sepsis may be life threatening. Sepsis can cause your blood pressure to drop. This may result in shock. Shock causes your central nervous system and your organs to stop working correctly. What increases the risk? Sepsis can happen in anyone, but it is more likely to happen in people who have weakened immune systems. What are the signs or symptoms? Symptoms of sepsis can include:  Fever or low body temperature (hypothermia).  Rapid breathing (hyperventilation).  Chills.  Rapid heartbeat (tachycardia).  Confusion or light-headedness.  Trouble breathing.  Urinating much less than usual.  Cool, clammy skin or red, flushed skin.  Other problems with the heart, kidneys, or brain. How is this diagnosed? Your health care provider will likely do tests to look for an infection, to see if the infection has spread to your blood, and to see how serious your condition is. Tests can include:  Blood tests, including cultures of your blood.  Cultures of other fluids from your body, such as:  Urine.  Pus from wounds.  Mucus coughed up from your lungs.  Urine tests other than cultures.  X-ray exams or other imaging tests. How is this treated? Treatment will begin with elimination of the source of infection. If your sepsis is likely caused by a bacterial or fungal infection, you will be given antibiotic or antifungal medicines. You may also receive:  Oxygen.  Fluids through an IV tube.  Medicines to increase your blood pressure.  A machine to clean your blood (dialysis) if your kidneys fail.  A machine to help you breathe if your lungs fail. Get help right away if: You get an infection or develop any of the signs and symptoms of sepsis after surgery or a hospitalization. This information is not intended to  replace advice given to you by your health care provider. Make sure you discuss any questions you have with your health care provider. Document Released: 02/28/2003 Document Revised: 11/07/2015 Document Reviewed: 02/06/2013 Elsevier Interactive Patient Education  2017 [employer]  

## 2016-05-24 NOTE — Progress Notes (Signed)
Discharge instructions given and went over with patient at bedside. Prescriptions given. All questions answered. Patient discharged home. Channah Godeaux S, RN  

## 2016-05-24 NOTE — Progress Notes (Signed)
Patient ID: Cassidy ProudLetitia D [name], female   DOB: 1986/04/06, 30 y.o.   MRN: [id number] [facility]        [name] was admitted to the Hospital on 05/21/2016 and Discharged  05/24/2016 and should be excused from work/school   for 6 days starting 05/21/2016 , may return to work/school without any restrictions.  Alford HighlandWIETING, Angelik Walls M.D on 05/24/2016,at 11:21 AM  [facility] [place] [facility]    Office  [phone]

## 2016-05-24 NOTE — Discharge Summary (Signed)
[employer] - [place] at [facility]   PATIENT NAME: [name]    MR#:  [id number]  DATE OF BIRTH:  May 25, 1986  DATE OF ADMISSION:  05/21/2016 ADMITTING PHYSICIAN: Tonye Royalty, DO  DATE OF DISCHARGE: 05/24/2016  1:40 PM  PRIMARY CARE PHYSICIAN: Kerby Nora, MD    ADMISSION DIAGNOSIS:  Sepsis, due to unspecified organism (HCC) [A41.9]  DISCHARGE DIAGNOSIS:  Active Problems:   Sepsis (HCC)   SECONDARY DIAGNOSIS:   Past Medical History:  Diagnosis Date  . Diabetes mellitus without complication (HCC)    before gastric sleeve procedure  . GERD (gastroesophageal reflux disease)   . Headache    when BP is up  . Hypertension   . Wears contact lenses     HOSPITAL COURSE:   1. Sepsis with Escherichia coli in the urine and blood culture. The urine culture surprisingly only grew out 20,000 Escherichia coli colonies but the CT scan showed mild fullness of the collecting system. I think the source of the sepsis is still the urine. Both the urine culture and blood cultures were pansensitive. Patient initially was on meropenem and then switched over to Rocephin and will be switched over to Keflex upon discharge home for 2 week total course. 2. Headache. Lumbar puncture was negative. Toradol and magnesium did not help the headache. I tried Fioricet which helped. I will prescribe some Fioricet at home. Headache worse after the lumbar puncture. Patient did not want to proceed with blood patch since she was feeling better. 3. History of type 2 diabetes prior to gastric sleeve procedure. 4. Morbid obesity 5. Vitamin B12 deficiency on labs back in October can consider replacement as outpatient  DISCHARGE CONDITIONS:   Satisfactory  CONSULTS OBTAINED:  Treatment Team:  Mick Sell, MD  DRUG ALLERGIES:   Allergies  Allergen Reactions  . Tomato Rash    DISCHARGE MEDICATIONS:   Discharge Medication List as of 05/24/2016 12:46 PM    START taking  these medications   Details  butalbital-acetaminophen-caffeine (FIORICET, ESGIC) 50-325-40 MG tablet Take 1 tablet by mouth every 6 (six) hours as needed for headache., Starting Sun 05/24/2016, Until Mon 05/24/2017, Print    cephALEXin (KEFLEX) 500 MG capsule Take 1 capsule (500 mg total) by mouth 3 (three) times daily., Starting Sun 05/24/2016, Print      CONTINUE these medications which have NOT CHANGED   Details  etonogestrel (NEXPLANON) 68 MG IMPL implant 1 each by Subdermal route once., Historical Med         DISCHARGE INSTRUCTIONS:   Follow-up with PMD one week  If you experience worsening of your admission symptoms, develop shortness of breath, life threatening emergency, suicidal or homicidal thoughts you must seek medical attention immediately by calling 911 or calling your MD immediately  if symptoms less severe.  You Must read complete instructions/literature along with all the possible adverse reactions/side effects for all the Medicines you take and that have been prescribed to you. Take any new Medicines after you have completely understood and accept all the possible adverse reactions/side effects.   Please note  You were cared for by a hospitalist during your hospital stay. If you have any questions about your discharge medications or the care you received while you were in the hospital after you are discharged, you can call the unit and asked to speak with the hospitalist on call if the hospitalist that took care of you is not available. Once you are discharged, your primary care physician will handle  any further medical issues. Please note that NO REFILLS for any discharge medications will be authorized once you are discharged, as it is imperative that you return to your primary care physician (or establish a relationship with a primary care physician if you do not have one) for your aftercare needs so that they can reassess your need for medications and monitor your lab  values.    Today   CHIEF COMPLAINT:   Chief Complaint  Patient presents with  . Headache    HISTORY OF PRESENT ILLNESS:  [name]  is a 30 y.o. female presented with headache and not feeling well   VITAL SIGNS:  Blood pressure (!) 148/89, pulse 76, temperature 98.2 F (36.8 C), temperature source Oral, resp. rate 19, height 5\' 6"  (1.676 m), weight 105.5 kg (232 lb 9.6 oz), SpO2 100 %.    PHYSICAL EXAMINATION:  GENERAL:  30 y.o.-year-old patient lying in the bed with no acute distress.  EYES: Pupils equal, round, reactive to light and accommodation. No scleral icterus. Extraocular muscles intact.  HEENT: Head atraumatic, normocephalic. Oropharynx and nasopharynx clear.  NECK:  Supple, no jugular venous distention. No thyroid enlargement, no tenderness.  LUNGS: Normal breath sounds bilaterally, no wheezing, rales,rhonchi or crepitation. No use of accessory muscles of respiration.  CARDIOVASCULAR: S1, S2 normal. No murmurs, rubs, or gallops.  ABDOMEN: Soft, non-tender, non-distended. Bowel sounds present. No organomegaly or mass.  EXTREMITIES: No pedal edema, cyanosis, or clubbing.  NEUROLOGIC: Cranial nerves II through XII are intact. Muscle strength 5/5 in all extremities. Sensation intact. Gait not checked.  PSYCHIATRIC: The patient is alert and oriented x 3.  SKIN: No obvious rash, lesion, or ulcer.   DATA REVIEW:   CBC  Recent Labs Lab 05/23/16 0414  WBC 6.7  HGB 11.4*  HCT 33.2*  PLT 219    Chemistries   Recent Labs Lab 05/21/16 1453  05/23/16 0414  NA 139  < > 139  K 3.3*  < > 3.7  CL 106  < > 110  CO2 22  < > 24  GLUCOSE 119*  < > 99  BUN 9  < > <5*  CREATININE 0.75  < > 0.53  CALCIUM 8.9  < > 8.4*  AST 21  --   --   ALT 18  --   --   ALKPHOS 64  --   --   BILITOT 0.8  --   --   < > = values in this interval not displayed.   Microbiology Results  Results for orders placed or performed during the hospital encounter of 05/21/16  Blood  Culture (routine x 2)     Status: Abnormal   Collection Time: 05/21/16  3:33 PM  Result Value Ref Range Status   Specimen Description BLOOD RIGHT ANTECUBITAL  Final   Special Requests BOTTLES DRAWN AEROBIC AND ANAEROBIC AER8CC,ANA11CC  Final   Culture  Setup Time   Final    GRAM NEGATIVE RODS IN BOTH AEROBIC AND ANAEROBIC BOTTLES CRITICAL VALUE NOTED.  VALUE IS CONSISTENT WITH PREVIOUSLY REPORTED AND CALLED VALUE.    Culture (A)  Final    ESCHERICHIA COLI SUSCEPTIBILITIES PERFORMED ON PREVIOUS CULTURE WITHIN THE LAST 5 DAYS. Performed at [facility]    Report Status 05/24/2016 FINAL  Final  Urine culture     Status: Abnormal   Collection Time: 05/21/16  3:33 PM  Result Value Ref Range Status   Specimen Description URINE, RANDOM  Final   Special Requests NONE  Final   Culture 20,000 COLONIES/mL ESCHERICHIA COLI (A)  Final   Report Status 05/23/2016 FINAL  Final   Organism ID, Bacteria ESCHERICHIA COLI (A)  Final      Susceptibility   Escherichia coli - MIC*    AMPICILLIN 4 SENSITIVE Sensitive     CEFAZOLIN <=4 SENSITIVE Sensitive     CEFTRIAXONE <=1 SENSITIVE Sensitive     CIPROFLOXACIN <=0.25 SENSITIVE Sensitive     GENTAMICIN <=1 SENSITIVE Sensitive     IMIPENEM <=0.25 SENSITIVE Sensitive     NITROFURANTOIN <=16 SENSITIVE Sensitive     TRIMETH/SULFA <=20 SENSITIVE Sensitive     AMPICILLIN/SULBACTAM <=2 SENSITIVE Sensitive     PIP/TAZO <=4 SENSITIVE Sensitive     Extended ESBL NEGATIVE Sensitive     * 20,000 COLONIES/mL ESCHERICHIA COLI  Blood Culture (routine x 2)     Status: Abnormal   Collection Time: 05/21/16  3:34 PM  Result Value Ref Range Status   Specimen Description BLOOD LEFT ANTECUBITAL  Final   Special Requests   Final    BOTTLES DRAWN AEROBIC AND ANAEROBIC AER 12CC,ANA11CC   Culture  Setup Time   Final    GRAM NEGATIVE RODS IN BOTH AEROBIC AND ANAEROBIC BOTTLES CRITICAL RESULT CALLED TO, READ BACK BY AND VERIFIED WITH: NATE COOKSON AT 0448 ON  05/22/16 [facility]. Performed at [facility]    Culture ESCHERICHIA COLI (A)  Final   Report Status 05/24/2016 FINAL  Final   Organism ID, Bacteria ESCHERICHIA COLI  Final      Susceptibility   Escherichia coli - MIC*    AMPICILLIN 4 SENSITIVE Sensitive     CEFAZOLIN <=4 SENSITIVE Sensitive     CEFEPIME <=1 SENSITIVE Sensitive     CEFTAZIDIME <=1 SENSITIVE Sensitive     CEFTRIAXONE <=1 SENSITIVE Sensitive     CIPROFLOXACIN <=0.25 SENSITIVE Sensitive     GENTAMICIN <=1 SENSITIVE Sensitive     IMIPENEM <=0.25 SENSITIVE Sensitive     TRIMETH/SULFA <=20 SENSITIVE Sensitive     AMPICILLIN/SULBACTAM <=2 SENSITIVE Sensitive     PIP/TAZO <=4 SENSITIVE Sensitive     Extended ESBL NEGATIVE Sensitive     * ESCHERICHIA COLI  Blood Culture ID Panel (Reflexed)     Status: Abnormal   Collection Time: 05/21/16  3:34 PM  Result Value Ref Range Status   Enterococcus species NOT DETECTED NOT DETECTED Final   Listeria monocytogenes NOT DETECTED NOT DETECTED Final   Staphylococcus species NOT DETECTED NOT DETECTED Final   Staphylococcus aureus NOT DETECTED NOT DETECTED Final   Streptococcus species NOT DETECTED NOT DETECTED Final   Streptococcus agalactiae NOT DETECTED NOT DETECTED Final   Streptococcus pneumoniae NOT DETECTED NOT DETECTED Final   Streptococcus pyogenes NOT DETECTED NOT DETECTED Final   Acinetobacter baumannii NOT DETECTED NOT DETECTED Final   Enterobacteriaceae species DETECTED (A) NOT DETECTED Final    Comment: CRITICAL RESULT CALLED TO, READ BACK BY AND VERIFIED WITH: NATE COOKSON AT 0448 ON 05/22/16 [facility].    Enterobacter cloacae complex NOT DETECTED NOT DETECTED Final   Escherichia coli DETECTED (A) NOT DETECTED Final    Comment: CRITICAL RESULT CALLED TO, READ BACK BY AND VERIFIED WITH: NATE COOKSON AT 0448 ON 05/22/16 [facility].    Klebsiella oxytoca NOT DETECTED NOT DETECTED Final   Klebsiella pneumoniae NOT DETECTED NOT DETECTED Final   Proteus species NOT DETECTED NOT  DETECTED Final   Serratia marcescens NOT DETECTED NOT DETECTED Final   Carbapenem resistance NOT DETECTED NOT DETECTED Final  Haemophilus influenzae NOT DETECTED NOT DETECTED Final   Neisseria meningitidis NOT DETECTED NOT DETECTED Final   Pseudomonas aeruginosa NOT DETECTED NOT DETECTED Final   Candida albicans NOT DETECTED NOT DETECTED Final   Candida glabrata NOT DETECTED NOT DETECTED Final   Candida krusei NOT DETECTED NOT DETECTED Final   Candida parapsilosis NOT DETECTED NOT DETECTED Final   Candida tropicalis NOT DETECTED NOT DETECTED Final  CSF culture     Status: None (Preliminary result)   Collection Time: 05/21/16  9:40 PM  Result Value Ref Range Status   Specimen Description CSF  Final   Special Requests NONE  Final   Culture   Final    NO GROWTH 2 DAYS Performed at [facility]    Report Status PENDING  Incomplete  Gram stain     Status: None (Preliminary result)   Collection Time: 05/21/16  9:40 PM  Result Value Ref Range Status   Specimen Description CSF  Final   Special Requests NONE  Final   Gram Stain   Final    NO ORGANISMS SEEN RARE WBCS CRITICAL RESULT CALLED TO, READ BACK BY AND VERIFIED WITH: ALLISON PATE ON 05/22/16 AT 0048 BY TLB    Report Status PENDING  Incomplete    Management plans discussed with the patient, family and they are in agreement.  CODE STATUS:  Code Status History    Date Active Date Inactive Code Status Order ID Comments User Context   05/22/2016  1:21 AM 05/24/2016  4:45 PM Full Code [id number]  Tonye RoyaltyAlexis Hugelmeyer, DO Inpatient   03/19/2015  5:36 PM 03/20/2015  6:18 PM Full Code [id number]  Lilla Shookon M Drinkwater, PA-C Inpatient      TOTAL TIME TAKING CARE OF THIS PATIENT: 35 minutes.    Alford HighlandWIETING, Craig Wisnewski M.D on 05/24/2016 at 5:52 PM  Between 7am to 6pm - Pager - [phone]  After 6pm go to [email or web address] - password [email or web address]  [employer] Office  [phone]  CC: Primary care physician; Kerby NoraAmy Bedsole, MD

## 2016-05-25 ENCOUNTER — Telehealth: Payer: Self-pay

## 2016-05-25 LAB — CSF CULTURE

## 2016-05-25 LAB — CSF CULTURE W GRAM STAIN: Culture: NO GROWTH

## 2016-06-02 LAB — GRAM STAIN: Gram Stain: NONE SEEN

## 2016-06-23 DIAGNOSIS — H5213 Myopia, bilateral: Secondary | ICD-10-CM | POA: Diagnosis not present

## 2016-06-23 LAB — HM DIABETES EYE EXAM

## 2016-07-07 ENCOUNTER — Emergency Department
Admission: EM | Admit: 2016-07-07 | Discharge: 2016-07-07 | Disposition: A | Discharge status: Home / Self Care | Payer: 59 | Attending: Emergency Medicine | Admitting: Emergency Medicine

## 2016-07-07 ENCOUNTER — Encounter: Payer: Self-pay | Admitting: Emergency Medicine

## 2016-07-07 DIAGNOSIS — K029 Dental caries, unspecified: Secondary | ICD-10-CM | POA: Diagnosis not present

## 2016-07-07 DIAGNOSIS — I10 Essential (primary) hypertension: Secondary | ICD-10-CM | POA: Diagnosis not present

## 2016-07-07 DIAGNOSIS — K0889 Other specified disorders of teeth and supporting structures: Secondary | ICD-10-CM | POA: Diagnosis present

## 2016-07-07 DIAGNOSIS — E119 Type 2 diabetes mellitus without complications: Secondary | ICD-10-CM | POA: Diagnosis not present

## 2016-07-07 DIAGNOSIS — K027 Dental root caries: Secondary | ICD-10-CM | POA: Insufficient documentation

## 2016-07-07 MED ORDER — HYDROCODONE-ACETAMINOPHEN 5-325 MG PO TABS: 1.0000 | ORAL_TABLET | Freq: Four times a day (QID) | ORAL | 0 refills | Status: DC | PRN

## 2016-07-07 MED ORDER — AMOXICILLIN 500 MG PO CAPS: 500.0000 mg | ORAL_CAPSULE | Freq: Three times a day (TID) | ORAL | 0 refills | Status: AC

## 2016-07-07 MED ORDER — MELOXICAM 15 MG PO TABS: 15.0000 mg | ORAL_TABLET | Freq: Every day | ORAL | 0 refills | Status: AC

## 2016-07-07 NOTE — Discharge Instructions (Signed)
Please call and schedule a dental appointment as soon as possible. You will need to be seen within the next 14 days. Return to the emergency department for symptoms that change or worsen if you're unable to schedule an appointment.  OPTIONS FOR DENTAL FOLLOW UP CARE  [place] Department of Health and Human Services - Local Safety Net Dental Clinics http://www.ncdhhs.gov/dph/oralhealth/services/safetynetclinics.htm   [facility] ([phone])  [facility] ([phone])  [facility] ([phone])  [facility] ([phone])  [facility] ([phone]) This clinic caters to the indigent population and is on a lottery system. Location: [facility], [address], [place] Clinic Hours: Wednesdays from 6pm - 9pm, patients seen by a lottery system. For dates, call or go to [email or web address] Services: Cleanings, fillings and simple extractions. Payment Options: DENTAL WORK IS FREE OF CHARGE. Bring proof of income or support. Best way to get seen: Arrive at 5:15 pm - this is a lottery, NOT first come/first serve, so arriving earlier will not increase your chances of being seen.     [facility] [phone] Select option 1 for emergencies   Location: [facility], [address], [place] Clinic Hours: No walk-ins accepted - call the day before to schedule an appointment. Check in times are 9:30 am and 1:30 pm. Services: Simple extractions, temporary fillings, pulpectomy/pulp debridement, uncomplicated abscess drainage. Payment Options: PAYMENT IS DUE AT THE TIME OF SERVICE.  Fee is usually $100-200, additional surgical procedures (e.g. abscess drainage) may be extra. Cash, checks, Visa/MasterCard accepted.  Can file Medicaid if patient is covered for dental - patient should call case worker to check. No  discount for [facility] patients. Best way to get seen: MUST call the day before and get onto the schedule. Can usually be seen the next 1-2 days. No walk-ins accepted.     [facility] [phone]   Location: [facility], [address], [place] Clinic Hours: M, W, Th, F 8am or 1:30pm, Tues 9a or 1:30 - first come/first served. Services: Simple extractions, temporary fillings, uncomplicated abscess drainage.  You do not need to be an [place] resident. Payment Options: PAYMENT IS DUE AT THE TIME OF SERVICE. Dental insurance, otherwise sliding scale - bring proof of income or support. Depending on income and treatment needed, cost is usually $50-200. Best way to get seen: Arrive early as it is first come/first served.     [facility] [phone]   Location: [address] Clinic Hours: Mon-Thu 8a-5p Services: Most basic dental services including extractions and fillings. Payment Options: PAYMENT IS DUE AT THE TIME OF SERVICE. Sliding scale, up to 50% off - bring proof if income or support. Medicaid with dental option accepted. Best way to get seen: Call to schedule an appointment, can usually be seen within 2 weeks OR they will try to see walk-ins - show up at 8a or 2p (you may have to wait).     [facility] [phone] [place] RESIDENTS ONLY   Location: [facility], [address], [place], [place] [address] Clinic Hours: By appointment only. Monday - Thursday 8am-5pm, Friday 8am-12pm Services: Cleanings, fillings, extractions. Payment Options: PAYMENT IS DUE AT THE TIME OF SERVICE. Cash, Visa or MasterCard. Sliding scale - $30 minimum per service. Best way to get seen: Come in to office, complete packet and make an appointment -   need proof of income or support monies for each household member and proof of [place]  residence. Usually takes about a month to get in.     [facility] [phone]   Location: [address] [place] Clinic Hours: Walk-in Urgent Care Dental Services are offered Monday-Friday mornings only. The numbers of emergencies accepted daily is limited to the number of providers available. Maximum 15 - Mondays, Wednesdays & Thursdays Maximum 10 - Tuesdays & Fridays Services: You do not need to be a [place] resident to be seen for a dental emergency. Emergencies are defined as pain, swelling, abnormal bleeding, or dental trauma. Walkins will receive x-rays if needed. NOTE: Dental cleaning is not an emergency. Payment Options: PAYMENT IS DUE AT THE TIME OF SERVICE. Minimum co-pay is $40.00 for uninsured patients. Minimum co-pay is $3.00 for Medicaid with dental coverage. Dental Insurance is accepted and must be presented at time of visit. Medicare does not cover dental. Forms of payment: Cash, credit card, checks. Best way to get seen: If not previously registered with the clinic, walk-in dental registration begins at 7:15 am and is on a first come/first serve basis. If previously registered with the clinic, call to make an appointment.     [facility] [phone] [place] RESIDENTS ONLY   Location: [address], [place], [place] Clinic Hours: Mon-Thu 10a-2p Services: Extractions only! Payment Options: FREE (donations accepted) - bring proof of income or support Best way to get seen: Call and schedule an appointment OR come at 8am on the 1st Monday of every month (except for holidays) when it is first come/first served.     [facility] [phone]   Location: [address], [place] Clinic Hours: Friday mornings Services, Payment Options, Best way to get seen: Call for info  

## 2016-07-07 NOTE — ED Triage Notes (Signed)
Pt ambulatory to triage with steady gait with c/o LEFT lower toothache accompanied by sore throat x 2 days. Left sided facial swelling noted. Pt reports has dentist appt in Feb. Pt reports has taken OTC without relief. Broken back tooth noted to LEFT lower jaw.

## 2016-07-07 NOTE — ED Provider Notes (Signed)
[facility] Emergency Department Provider Note  ____________________________________________  Time seen: Approximately 7:26 AM  I have reviewed the triage vital signs and the nursing notes.   HISTORY  Chief Complaint Dental Pain    HPI [name] is a 31 y.o. female presents to the emergency department with several months of lower left tooth pain. She states that her lower left molar is broken and she has several cavities. She states that she is drinking normally but it is painful to chew. Patient states that pain radiates into her cheek. Patient was seen in the emergency department for similar complaints this fall. Patient states that she is scheduled to have 4 teeth removed in February. Patient saw a dentist on January 3 for consultation. Patient had to wait several months to get teeth looked at because of insurance. Patient has taken ibuprofen and Tylenol for pain which have not helped. Patient denies fever, drainage from mouth, neck swelling, difficulty breathing, difficulty swallowing, nausea, vomiting, chest pain.   Past Medical History:  Diagnosis Date  . Diabetes mellitus without complication (HCC)    before gastric sleeve procedure  . GERD (gastroesophageal reflux disease)   . Headache    when BP is up  . Hypertension   . Wears contact lenses     Patient Active Problem List   Diagnosis Date Noted  . Sepsis (HCC) 05/21/2016  . Fatigue 03/19/2016  . Non-intractable cyclical vomiting with nausea   . Reflux esophagitis   . Abdominal pain, epigastric   . GERD (gastroesophageal reflux disease) 05/07/2015  . Bariatric surgery status 03/19/2015  . Morbid obesity (HCC) 05/16/2013  . Type II diabetes mellitus, well controlled (HCC) 09/22/2012  . Hx of primary hypertension 09/22/2012    Past Surgical History:  Procedure Laterality Date  . CESAREAN SECTION  04/30/2009   decreased fetal heart tones due to umbilical cord wrapped  . CESAREAN  SECTION    . CHOLECYSTECTOMY    . ESOPHAGOGASTRODUODENOSCOPY (EGD) WITH PROPOFOL N/A 10/24/2015   Procedure: ESOPHAGOGASTRODUODENOSCOPY (EGD) WITH PROPOFOL;  Surgeon: Midge Minium, MD;  Location: [facility];  Service: Endoscopy;  Laterality: N/A;  . LAPAROSCOPIC GASTRIC SLEEVE RESECTION N/A 03/19/2015   Procedure: LAPAROSCOPIC GASTRIC SLEEVE RESECTION;  Surgeon: Everette Rank, MD;  Location: [facility] ORS;  Service: General;  Laterality: N/A;    Prior to Admission medications   Medication Sig Start Date End Date Taking? Authorizing Provider  amoxicillin (AMOXIL) 500 MG capsule Take 1 capsule (500 mg total) by mouth 3 (three) times daily. 07/07/16 07/17/16  Enid Derry, PA-C  etonogestrel (NEXPLANON) 68 MG IMPL implant 1 each by Subdermal route once.    Historical Provider, MD  HYDROcodone-acetaminophen (NORCO/VICODIN) 5-325 MG tablet Take 1 tablet by mouth every 6 (six) hours as needed for moderate pain. 07/07/16   Enid Derry, PA-C  meloxicam (MOBIC) 15 MG tablet Take 1 tablet (15 mg total) by mouth daily. 07/07/16 07/17/16  Enid Derry, PA-C    Allergies Tomato  Family History  Problem Relation Age of Onset  . Diabetes Father   . Hypertension Father   . Hyperlipidemia Father   . Hypertension Sister   . Birth defects Sister     fluid on brain cause blindness in B eye    Social History Social History  Substance Use Topics  . Smoking status: Never Smoker  . Smokeless tobacco: Never Used  . Alcohol use No     Comment: 1 drink/mo     Review of Systems  Constitutional: No fever/chills ENT: No upper respiratory complaints. Cardiovascular: No chest pain. Respiratory: No cough. No SOB. Gastrointestinal: No abdominal pain.  No nausea, no vomiting.  Skin: Negative for rash, abrasions, lacerations, ecchymosis. Neurological: Negative for headaches, numbness or tingling   ____________________________________________   PHYSICAL EXAM:  VITAL SIGNS: ED Triage Vitals  Enc  Vitals Group     BP 07/07/16 0521 (!) 158/95     Pulse Rate 07/07/16 0521 66     Resp 07/07/16 0521 18     Temp 07/07/16 0521 98.1 F (36.7 C)     Temp Source 07/07/16 0521 Oral     SpO2 07/07/16 0521 100 %     Weight 07/07/16 0522 208 lb (94.3 kg)     Height 07/07/16 0522 5\' 6"  (1.676 m)     Head Circumference --      Peak Flow --      Pain Score 07/07/16 0522 10     Pain Loc --      Pain Edu? --      Excl. in GC? --      Constitutional: Alert and oriented. Well appearing and in no acute distress. Eyes: Conjunctivae are normal. PERRL. EOMI. Head: Atraumatic. ENT:      Ears:      Nose: No congestion/rhinnorhea.      Mouth/Throat: Mucous membranes are moist. Multiple cavities present. Tooth #18 is fractured. Uvula midline. Oropharynx non-erythematous. Tonsils not enlarged. No exudates. No TMJ pain. Mild swelling over left cheek. No areas of fluctuance on buccal or mucosal surfaces. Neck: No stridor.   Hematological/Lymphatic/Immunilogical: No cervical lymphadenopathy. Cardiovascular: Normal rate, regular rhythm.  Good peripheral circulation. Respiratory: Normal respiratory effort without tachypnea or retractions. Lungs CTAB. Good air entry to the bases with no decreased or absent breath sounds. Musculoskeletal: Full range of motion to all extremities. No gross deformities appreciated. Neurologic:  Normal speech and language. No gross focal neurologic deficits are appreciated.  Skin:  Skin is warm, dry and intact. No rash noted. Psychiatric: Mood and affect are normal. Speech and behavior are normal. Patient exhibits appropriate insight and judgement.   ____________________________________________   LABS (all labs ordered are listed, but only abnormal results are displayed)  Labs Reviewed - No data to display ____________________________________________  EKG   ____________________________________________  RADIOLOGY   No results  found.  ____________________________________________    PROCEDURES  Procedure(s) performed:    Procedures    Medications - No data to display   ____________________________________________   INITIAL IMPRESSION / ASSESSMENT AND PLAN / ED COURSE  Pertinent labs & imaging results that were available during my care of the patient were reviewed by me and considered in my medical decision making (see chart for details).  Review of the [place] CSRS was performed in accordance of the [facility] prior to dispensing any controlled drugs.     Patient's diagnosis is consistent with dental infection. Vital signs and exam are reassuring. I encouraged patient to see if dental appointment can be moved sooner. Patient will be discharged home with prescriptions for amoxacillin, a very short course of Vicodin, and meloxicam. Patient is to follow up with dentist as directed. Patient is given ED precautions to return to the ED for any worsening or new symptoms.     ____________________________________________  FINAL CLINICAL IMPRESSION(S) / ED DIAGNOSES  Final diagnoses:  Dental caries      NEW MEDICATIONS STARTED DURING THIS VISIT:  New Prescriptions   AMOXICILLIN (AMOXIL) 500 MG CAPSULE    Take  1 capsule (500 mg total) by mouth 3 (three) times daily.   HYDROCODONE-ACETAMINOPHEN (NORCO/VICODIN) 5-325 MG TABLET    Take 1 tablet by mouth every 6 (six) hours as needed for moderate pain.   MELOXICAM (MOBIC) 15 MG TABLET    Take 1 tablet (15 mg total) by mouth daily.        This chart was dictated using voice recognition software/Dragon. Despite best efforts to proofread, errors can occur which can change the meaning. Any change was purely unintentional.    Enid DerryAshley Rankin Coolman, PA-C 07/07/16 [id number]    Phineas SemenGraydon Goodman, MD 07/07/16 [phone]

## 2016-07-07 NOTE — ED Notes (Signed)
See triage note   States  She thinks she may have cracked a tooth on the left lower  Has appt in feb for same  Increased pain with swelling to left side of face    States pain radiates into ear

## 2016-07-08 ENCOUNTER — Ambulatory Visit
Admission: RE | Admit: 2016-07-08 | Discharge: 2016-07-08 | Disposition: A | Discharge status: Home / Self Care | Payer: 59 | Source: Ambulatory Visit | Attending: Physician Assistant | Admitting: Physician Assistant

## 2016-07-08 ENCOUNTER — Ambulatory Visit: Payer: Self-pay | Admitting: Physician Assistant

## 2016-07-08 ENCOUNTER — Encounter: Payer: Self-pay | Admitting: Physician Assistant

## 2016-07-08 VITALS — BP 158/108 | HR 96 | Temp 99.0°F | Resp 18

## 2016-07-08 DIAGNOSIS — L0291 Cutaneous abscess, unspecified: Secondary | ICD-10-CM | POA: Insufficient documentation

## 2016-07-08 MED ORDER — SODIUM CHLORIDE 0.9 % IV SOLN
Freq: Once | INTRAVENOUS | Status: AC
  Administered 2016-07-08: 16:00:00 via INTRAVENOUS

## 2016-07-08 MED ORDER — CLINDAMYCIN PHOSPHATE 600 MG/50ML IV SOLN
600.0000 mg | Freq: Once | INTRAVENOUS | Status: AC
Start: 2016-07-08 — End: 2016-07-08
  Administered 2016-07-08: 600 mg via INTRAVENOUS

## 2016-07-08 MED ORDER — METHYLPREDNISOLONE SODIUM SUCC 125 MG IJ SOLR
120.0000 mg | Freq: Once | INTRAMUSCULAR | Status: AC
  Administered 2016-07-08: 120 mg via INTRAVENOUS
  Filled 2016-07-08: qty 2

## 2016-07-08 MED ORDER — CLINDAMYCIN PHOSPHATE 600 MG/50ML IV SOLN
INTRAVENOUS | Status: AC
  Filled 2016-07-08: qty 50

## 2016-07-08 NOTE — OR Nursing (Signed)
Pt arrived, VSS IV started, antibiotic started.  Solumedrol given after IV line flushed.  1liter of NS bolus infused and IV discontinued. Pt disch home>

## 2016-07-08 NOTE — Progress Notes (Signed)
Pt c/o sore throat since last night. Productive cough, runny nose, L ear pain. Seen yesterday in ED for tooth infection. Given Amoxicillin and Vicodin.

## 2016-07-08 NOTE — Progress Notes (Signed)
S: C/o sore throat and dental abscess, took 3 doses of amoxil yesterday and one today, states she can't open her mouth and is having difficulty swallowing, fever, chills today, hard to swallow water today bc she can't open her mouth  O; vitals w low grade temp at 99, perrl eomi, + swelling at left side of face/jaw, + trismus, unable to see throat due to trismus, neck supple, lungs c t a, cv rrr, flu swab neg  A: dental abscess, ?tonsillar abscess   P: clindamycin 600mg  iv, solumedrol 120mg  iv, saline 1L; recheck tomorrow, go to ER if worsening overnight

## 2016-07-09 ENCOUNTER — Ambulatory Visit: Payer: Self-pay | Admitting: Physician Assistant

## 2016-07-09 ENCOUNTER — Encounter: Payer: Self-pay | Admitting: Physician Assistant

## 2016-07-09 VITALS — BP 142/100 | HR 80 | Temp 98.1°F

## 2016-07-09 DIAGNOSIS — K047 Periapical abscess without sinus: Secondary | ICD-10-CM

## 2016-07-09 NOTE — Progress Notes (Signed)
S: here for recheck of dental abscess, sent to [facility] yesterday for iv antibiotics and steroids, pt could not open her mouth enough to see her throat yesterday, no fever/chills, states she is feeling much better, no cp/sob, still taking amoxil  O: vitals wnl, nad, decresed swelling at left mandible, improved opening and closing of her mouth, throat is wnl, still swelling at left lower molar, neck supple no lymph, lungs c t a, cv rrr  A: recheck of dental abscess  P: recheck tomorrow at 220, continue amoxil

## 2016-07-10 ENCOUNTER — Ambulatory Visit: Payer: Self-pay | Admitting: Physician Assistant

## 2016-07-28 NOTE — Telephone Encounter (Signed)
TCM outreach attempt unsuccessful.

## 2016-10-07 ENCOUNTER — Ambulatory Visit (INDEPENDENT_AMBULATORY_CARE_PROVIDER_SITE_OTHER): Payer: 59 | Admitting: Obstetrics and Gynecology

## 2016-10-07 ENCOUNTER — Encounter: Payer: Self-pay | Admitting: Obstetrics and Gynecology

## 2016-10-07 VITALS — BP 124/88 | Ht 67.0 in | Wt 232.0 lb

## 2016-10-07 DIAGNOSIS — Z113 Encounter for screening for infections with a predominantly sexual mode of transmission: Secondary | ICD-10-CM | POA: Diagnosis not present

## 2016-10-07 DIAGNOSIS — Z01419 Encounter for gynecological examination (general) (routine) without abnormal findings: Secondary | ICD-10-CM

## 2016-10-07 DIAGNOSIS — N852 Hypertrophy of uterus: Secondary | ICD-10-CM

## 2016-10-07 NOTE — Progress Notes (Signed)
Gynecology Annual Exam  PCP: Kerby Nora, MD  Chief Complaint:  Chief Complaint  Patient presents with  . Gynecologic Exam    History of Present Illness: Patient is a 31 y.o. G2P1011 presents for annual exam. The patient has no complaints today.   LMP: Patient's last menstrual period was 10/04/2016 (exact date). Absent secondary to nexplanon   The patient is sexually active. She currently uses nexplanon for contraception. She denies dyspareunia.  The patient does perform self breast exams.  There is no notable family history of breast or ovarian cancer in her family.  The patient wears seatbelts: yes.   The patient has regular exercise: not asked.     Review of Systems: Review of Systems  Constitutional: Negative for chills and fever.  HENT: Negative for congestion.   Respiratory: Negative for cough and shortness of breath.   Cardiovascular: Negative for chest pain and palpitations.  Gastrointestinal: Negative for abdominal pain, constipation, diarrhea, heartburn, nausea and vomiting.  Genitourinary: Negative for dysuria, frequency and urgency.  Skin: Negative for itching and rash.  Neurological: Negative for dizziness and headaches.  Endo/Heme/Allergies: Negative for polydipsia.  Psychiatric/Behavioral: Negative for depression.    Past Medical History:  Past Medical History:  Diagnosis Date  . Diabetes mellitus without complication (HCC)    before gastric sleeve procedure  . GERD (gastroesophageal reflux disease)   . Headache    when BP is up  . Hypertension   . Wears contact lenses     Past Surgical History:  Past Surgical History:  Procedure Laterality Date  . CESAREAN SECTION  04/30/2009   decreased fetal heart tones due to umbilical cord wrapped  . CESAREAN SECTION    . CHOLECYSTECTOMY    . ESOPHAGOGASTRODUODENOSCOPY (EGD) WITH PROPOFOL N/A 10/24/2015   Procedure: ESOPHAGOGASTRODUODENOSCOPY (EGD) WITH PROPOFOL;  Surgeon: Midge Minium, MD;  Location:  [facility];  Service: Endoscopy;  Laterality: N/A;  . LAPAROSCOPIC GASTRIC SLEEVE RESECTION N/A 03/19/2015   Procedure: LAPAROSCOPIC GASTRIC SLEEVE RESECTION;  Surgeon: Everette Rank, MD;  Location: [facility] ORS;  Service: General;  Laterality: N/A;    Gynecologic History:  Patient's last menstrual period was 10/04/2016 (exact date). Contraception: nexplaon Last Pap: Results were: no abnormalities   Obstetric History: G2P1011  Family History:  Family History  Problem Relation Age of Onset  . Diabetes Father   . Hypertension Father   . Hyperlipidemia Father   . Hypertension Sister   . Birth defects Sister     fluid on brain cause blindness in B eye    Social History:  Social History   Social History  . Marital status: Single    Spouse name: N/A  . Number of children: N/A  . Years of education: N/A   Occupational History  . Not on file.   Social History Main Topics  . Smoking status: Never Smoker  . Smokeless tobacco: Never Used  . Alcohol use No     Comment: 1 drink/mo  . Drug use: No  . Sexual activity: Yes    Partners: Male    Birth control/ protection: Implant     Comment: Nutraplanada   Other Topics Concern  . Not on file   Social History Narrative   Single, on child age 35.    Allergies:  Allergies  Allergen Reactions  . Tomato Rash    Medications: Prior to Admission medications   Medication Sig Start Date End Date Taking? Authorizing Provider  etonogestrel (NEXPLANON) 68 MG IMPL implant  1 each by Subdermal route once.    Historical Provider, MD    Physical Exam Vitals: Blood pressure 124/88, height  (1.702 m), weight 232 lb (105.2 kg), last menstrual period 10/04/2016.  General: NAD HEENT: normocephalic, anicteric Thyroid: no enlargement, no palpable nodules Pulmonary: No increased work of breathing, CTAB Cardiovascular: RRR, distal pulses 2+ Breast: Breast symmetrical, no tenderness, no palpable nodules or masses, no skin or  nipple retraction present, no nipple discharge.  No axillary or supraclavicular lymphadenopathy. Abdomen: NABS, soft, non-tender, non-distended.  Umbilicus without lesions.  No hepatomegaly, splenomegaly or masses palpable. No evidence of hernia  Genitourinary:  External: Normal external female genitalia.  Normal urethral meatus, normal  Bartholin's and Skene's glands.    Vagina: Normal vaginal mucosa, no evidence of prolapse.    Cervix: Grossly normal in appearance, no bleeding  Uterus: Enlarged 12 week size, mobile, normal contour.  No CMT  Adnexa: ovaries non-enlarged, no adnexal masses  Rectal: deferred  Lymphatic: no evidence of inguinal lymphadenopathy Extremities: no edema, erythema, or tenderness Neurologic: Grossly intact Psychiatric: mood appropriate, affect full  Female chaperone present for pelvic and breast  portions of the physical exam    Assessment: 31 y.o. G2P1011 No problem-specific Assessment & Plan notes found for this encounter.   Plan: Problem List Items Addressed This Visit    None    Visit Diagnoses    Encounter for gynecological examination without abnormal finding       Relevant Orders   GC/Chlamydia Probe Amp   HEP, RPR, HIV Panel   Screen for STD (sexually transmitted disease)       Relevant Orders   GC/Chlamydia Probe Amp   HEP, RPR, HIV Panel      1) STI screening was offered and accepted  2) ASCCP guidelines and rational discussed.  Patient opts for every 3 years screening interval  3) Contraception - Education given regarding options for contraception, including Nexplanon.  4) Routine healthcare maintenance including cholesterol, diabetes screening discussed managed by PCP  5) TVUS in 1 week evaluate for uterine fibroids  6) Follow up 1 year for routine annual exam

## 2016-10-07 NOTE — Patient Instructions (Signed)
Preventive Care 18-39 Years, Female Preventive care refers to lifestyle choices and visits with your health care provider that can promote health and wellness. What does preventive care include?  A yearly physical exam. This is also called an annual well check.  Dental exams once or twice a year.  Routine eye exams. Ask your health care provider how often you should have your eyes checked.  Personal lifestyle choices, including:  Daily care of your teeth and gums.  Regular physical activity.  Eating a healthy diet.  Avoiding tobacco and drug use.  Limiting alcohol use.  Practicing safe sex.  Taking vitamin and mineral supplements as recommended by your health care provider. What happens during an annual well check? The services and screenings done by your health care provider during your annual well check will depend on your age, overall health, lifestyle risk factors, and family history of disease. Counseling  Your health care provider may ask you questions about your:  Alcohol use.  Tobacco use.  Drug use.  Emotional well-being.  Home and relationship well-being.  Sexual activity.  Eating habits.  Work and work environment.  Method of birth control.  Menstrual cycle.  Pregnancy history. Screening  You may have the following tests or measurements:  Height, weight, and BMI.  Diabetes screening. This is done by checking your blood sugar (glucose) after you have not eaten for a while (fasting).  Blood pressure.  Lipid and cholesterol levels. These may be checked every 5 years starting at age 20.  Skin check.  Hepatitis C blood test.  Hepatitis B blood test.  Sexually transmitted disease (STD) testing.  BRCA-related cancer screening. This may be done if you have a family history of breast, ovarian, tubal, or peritoneal cancers.  Pelvic exam and Pap test. This may be done every 3 years starting at age 21. Starting at age 30, this may be done every 5  years if you have a Pap test in combination with an HPV test. Discuss your test results, treatment options, and if necessary, the need for more tests with your health care provider. Vaccines  Your health care provider may recommend certain vaccines, such as:  Influenza vaccine. This is recommended every year.  Tetanus, diphtheria, and acellular pertussis (Tdap, Td) vaccine. You may need a Td booster every 10 years.  Varicella vaccine. You may need this if you have not been vaccinated.  HPV vaccine. If you are 26 or younger, you may need three doses over 6 months.  Measles, mumps, and rubella (MMR) vaccine. You may need at least one dose of MMR. You may also need a second dose.  Pneumococcal 13-valent conjugate (PCV13) vaccine. You may need this if you have certain conditions and were not previously vaccinated.  Pneumococcal polysaccharide (PPSV23) vaccine. You may need one or two doses if you smoke cigarettes or if you have certain conditions.  Meningococcal vaccine. One dose is recommended if you are age 19-21 years and a first-year college student living in a residence hall, or if you have one of several medical conditions. You may also need additional booster doses.  Hepatitis A vaccine. You may need this if you have certain conditions or if you travel or work in places where you may be exposed to hepatitis A.  Hepatitis B vaccine. You may need this if you have certain conditions or if you travel or work in places where you may be exposed to hepatitis B.  Haemophilus influenzae type b (Hib) vaccine. You may need this   if you have certain risk factors. Talk to your health care provider about which screenings and vaccines you need and how often you need them. This information is not intended to replace advice given to you by your health care provider. Make sure you discuss any questions you have with your health care provider. Document Released: 07/28/2001 Document Revised: 02/19/2016  Document Reviewed: 04/02/2015 Elsevier Interactive Patient Education  2017 [employer].

## 2016-10-10 LAB — GC/CHLAMYDIA PROBE AMP
Chlamydia trachomatis, NAA: NEGATIVE
Neisseria gonorrhoeae by PCR: NEGATIVE

## 2016-10-14 ENCOUNTER — Other Ambulatory Visit: Payer: 59

## 2016-10-14 DIAGNOSIS — Z01419 Encounter for gynecological examination (general) (routine) without abnormal findings: Secondary | ICD-10-CM | POA: Diagnosis not present

## 2016-10-14 DIAGNOSIS — Z113 Encounter for screening for infections with a predominantly sexual mode of transmission: Secondary | ICD-10-CM | POA: Diagnosis not present

## 2016-10-15 LAB — HEP, RPR, HIV PANEL
HEP B S AG: NEGATIVE
HIV Screen 4th Generation wRfx: NONREACTIVE
RPR: NONREACTIVE

## 2016-10-19 ENCOUNTER — Encounter: Payer: Self-pay | Admitting: Obstetrics and Gynecology

## 2016-10-19 ENCOUNTER — Ambulatory Visit (INDEPENDENT_AMBULATORY_CARE_PROVIDER_SITE_OTHER): Payer: 59

## 2016-10-19 ENCOUNTER — Ambulatory Visit (INDEPENDENT_AMBULATORY_CARE_PROVIDER_SITE_OTHER): Payer: 59 | Admitting: Obstetrics and Gynecology

## 2016-10-19 VITALS — BP 128/88 | HR 89 | Ht 67.0 in | Wt 237.0 lb

## 2016-10-19 DIAGNOSIS — N852 Hypertrophy of uterus: Secondary | ICD-10-CM

## 2016-10-19 NOTE — Progress Notes (Signed)
Gynecology Ultrasound Follow Up  Chief Complaint:  Chief Complaint  Patient presents with  . GYN U/S follow up     History of Present Illness: Patient is a 31 y.o. female who presents today for ultrasound evaluation of enlarged uterus noted on physical exam at time of annual.  Ultrasound demonstrates the following findgins Adnexa: no masses seen normal consistency Uterus: non-enlarged with normal endometrial stripe    Review of Systems: Review of Systems  Constitutional: Negative for malaise/fatigue.  Gastrointestinal: Negative for abdominal pain.    Past Medical History:  Past Medical History:  Diagnosis Date  . Diabetes mellitus without complication (HCC)    before gastric sleeve procedure  . GERD (gastroesophageal reflux disease)   . Headache    when BP is up  . Hypertension   . Wears contact lenses     Past Surgical History:  Past Surgical History:  Procedure Laterality Date  . CESAREAN SECTION  04/30/2009   decreased fetal heart tones due to umbilical cord wrapped  . CHOLECYSTECTOMY    . ESOPHAGOGASTRODUODENOSCOPY (EGD) WITH PROPOFOL N/A 10/24/2015   Procedure: ESOPHAGOGASTRODUODENOSCOPY (EGD) WITH PROPOFOL;  Surgeon: Midge Minium, MD;  Location: [facility];  Service: Endoscopy;  Laterality: N/A;  . LAPAROSCOPIC GASTRIC SLEEVE RESECTION N/A 03/19/2015   Procedure: LAPAROSCOPIC GASTRIC SLEEVE RESECTION;  Surgeon: Everette Rank, MD;  Location: [facility] OR[facility];  Service: General;  Laterality: N/A;    Gynecologic History:  Patient's last menstrual period was 10/04/2016 (exact date).   Family History:  Family History  Problem Relation Age of Onset  . Diabetes Father   . Hypertension Father   . Hyperlipidemia Father   . Hypertension Sister   . Birth defects Sister        fluid on brain cause blindness in B eye    Social History:  Social History   Social History  . Marital status: Single    Spouse name: N/A  . Number of children: N/A  . Years of  education: N/A   Occupational History  . Not on file.   Social History Main Topics  . Smoking status: Never Smoker  . Smokeless tobacco: Never Used  . Alcohol use No     Comment: 1 drink/mo  . Drug use: No  . Sexual activity: Yes    Partners: Male    Birth control/ protection: Implant     Comment: Nutraplanada   Other Topics Concern  . Not on file   Social History Narrative   Single, on child age 31.    Allergies:  Allergies  Allergen Reactions  . Tomato Rash    Medications: Prior to Admission medications   Medication Sig Start Date End Date Taking? Authorizing Provider  etonogestrel (NEXPLANON) 68 MG IMPL implant 1 each by Subdermal route once.    [provider]    Physical Exam Vitals: Blood pressure 128/88, pulse 89, height 5\' 7"  (1.702 m), weight 237 lb (107.5 kg), last menstrual period 10/04/2016.  General: NAD HEENT: normocephalic, anicteric Pulmonary: No increased work of breathing Extremities: no edema, erythema, or tenderness Neurologic: Grossly intact, normal gait Psychiatric: mood appropriate, affect full   Assessment: 31 y.o. G2P1011 No problem-specific Assessment & Plan notes found for this encounter.   Plan: Problem List Items Addressed This Visit    None    Visit Diagnoses    Bulky or enlarged uterus    -  Primary      1) Normal ultrasound, no evidence of fibroids, adnexal masses  LOS no charge

## 2016-12-31 ENCOUNTER — Ambulatory Visit: Payer: Self-pay | Admitting: Physician Assistant

## 2016-12-31 ENCOUNTER — Encounter: Payer: Self-pay | Admitting: Physician Assistant

## 2016-12-31 VITALS — BP 140/100 | HR 68 | Temp 98.6°F

## 2016-12-31 DIAGNOSIS — R590 Localized enlarged lymph nodes: Secondary | ICD-10-CM

## 2016-12-31 NOTE — Progress Notes (Signed)
S: c/o headache and swollen node on back of her neck, no fever/chills, had her hair done about 3 weeks ago, no new products that she knows of, no cough or congestion  O: vitals wnl, nad, skin with several dried scabbed bumps posteriorly on scalp, no drainage noted, neck supple, + on posterior cervical node, lungs c t a, cv rrr  A: lymphadenopathy secondary scalp infection  P: otc neosporin, vaseline, if node not decreased in size by Monday or if worsening will call in an antibiotic

## 2017-01-29 ENCOUNTER — Emergency Department
Admission: EM | Admit: 2017-01-29 | Discharge: 2017-01-29 | Disposition: A | Discharge status: Home / Self Care | Payer: 59 | Attending: Emergency Medicine | Admitting: Emergency Medicine

## 2017-01-29 ENCOUNTER — Encounter: Payer: Self-pay | Admitting: Emergency Medicine

## 2017-01-29 DIAGNOSIS — Z5321 Procedure and treatment not carried out due to patient leaving prior to being seen by health care provider: Secondary | ICD-10-CM | POA: Insufficient documentation

## 2017-01-29 DIAGNOSIS — R531 Weakness: Secondary | ICD-10-CM | POA: Diagnosis not present

## 2017-01-29 DIAGNOSIS — R51 Headache: Secondary | ICD-10-CM | POA: Insufficient documentation

## 2017-01-29 LAB — BASIC METABOLIC PANEL
Anion gap: 6 (ref 5–15)
BUN: 13 mg/dL (ref 6–20)
CHLORIDE: 106 mmol/L (ref 101–111)
CO2: 26 mmol/L (ref 22–32)
CREATININE: 0.72 mg/dL (ref 0.44–1.00)
Calcium: 8.8 mg/dL — ABNORMAL LOW (ref 8.9–10.3)
GFR calc non Af Amer: >60 mL/min (ref >60)
GLUCOSE: 176 mg/dL — AB (ref 65–99)
Potassium: 3.4 mmol/L — ABNORMAL LOW (ref 3.5–5.1)
Sodium: 138 mmol/L (ref 135–145)

## 2017-01-29 LAB — CBC
HCT: 34.8 % — ABNORMAL LOW (ref 35.0–47.0)
Hemoglobin: 11.3 g/dL — ABNORMAL LOW (ref 12.0–16.0)
MCH: 24.4 pg — AB (ref 26.0–34.0)
MCHC: 32.4 g/dL (ref 32.0–36.0)
MCV: 75.4 fL — AB (ref 80.0–100.0)
PLATELETS: 278 10*3/uL (ref 150–440)
RBC: 4.61 MIL/uL (ref 3.80–5.20)
RDW: 15 % — ABNORMAL HIGH (ref 11.5–14.5)
WBC: 12.6 10*3/uL — ABNORMAL HIGH (ref 3.6–11.0)

## 2017-01-29 NOTE — ED Triage Notes (Addendum)
States she developed headache and dizziness with some weakness about 4-5 days ago  Positive nausea no fever   States she has had h/a all week   No relief with OTC excedrin

## 2017-02-01 ENCOUNTER — Telehealth: Payer: Self-pay | Admitting: Emergency Medicine

## 2017-02-01 NOTE — Telephone Encounter (Signed)
Called patient due to lwot to inquire about condition and follow up plans. Left message.   

## 2017-03-25 ENCOUNTER — Encounter: Payer: Self-pay | Admitting: Emergency Medicine

## 2017-03-25 ENCOUNTER — Emergency Department: Payer: 59

## 2017-03-25 ENCOUNTER — Emergency Department
Admission: EM | Admit: 2017-03-25 | Discharge: 2017-03-25 | Disposition: A | Discharge status: Home / Self Care | Payer: 59 | Attending: Emergency Medicine | Admitting: Emergency Medicine

## 2017-03-25 DIAGNOSIS — E119 Type 2 diabetes mellitus without complications: Secondary | ICD-10-CM | POA: Diagnosis not present

## 2017-03-25 DIAGNOSIS — M79605 Pain in left leg: Secondary | ICD-10-CM | POA: Diagnosis not present

## 2017-03-25 DIAGNOSIS — S8992XA Unspecified injury of left lower leg, initial encounter: Secondary | ICD-10-CM | POA: Diagnosis not present

## 2017-03-25 DIAGNOSIS — M25562 Pain in left knee: Secondary | ICD-10-CM | POA: Diagnosis not present

## 2017-03-25 DIAGNOSIS — I10 Essential (primary) hypertension: Secondary | ICD-10-CM | POA: Diagnosis not present

## 2017-03-25 MED ORDER — CYCLOBENZAPRINE HCL 5 MG PO TABS: 5.0000 mg | ORAL_TABLET | Freq: Three times a day (TID) | ORAL | 0 refills | Status: DC | PRN

## 2017-03-25 MED ORDER — NABUMETONE 750 MG PO TABS: 750.0000 mg | ORAL_TABLET | Freq: Two times a day (BID) | ORAL | 0 refills | Status: DC
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
## 2017-03-25 NOTE — ED Notes (Signed)
Left knee pain x 2 days. No obvious injury. Able to bear weight, at work today.
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
## 2017-03-30 ENCOUNTER — Ambulatory Visit: Payer: Self-pay | Admitting: Family Medicine

## 2017-03-30 DIAGNOSIS — Z0289 Encounter for other administrative examinations: Secondary | ICD-10-CM

## 2017-05-10 ENCOUNTER — Ambulatory Visit: Payer: Self-pay | Admitting: Physician Assistant

## 2017-05-10 ENCOUNTER — Encounter: Payer: Self-pay | Admitting: Physician Assistant

## 2017-05-10 VITALS — BP 158/102 | HR 79 | Temp 98.7°F

## 2017-05-10 DIAGNOSIS — J029 Acute pharyngitis, unspecified: Secondary | ICD-10-CM

## 2017-05-10 DIAGNOSIS — J040 Acute laryngitis: Secondary | ICD-10-CM

## 2017-05-10 MED ORDER — BENZONATATE 100 MG PO CAPS: 200.0000 mg | ORAL_CAPSULE | Freq: Three times a day (TID) | ORAL | 0 refills | Status: DC | PRN

## 2017-05-10 MED ORDER — METHYLPREDNISOLONE 4 MG PO TBPK: ORAL_TABLET | ORAL | 0 refills | Status: DC

## 2017-05-10 MED ORDER — FEXOFENADINE-PSEUDOEPHED ER 60-120 MG PO TB12: 1.0000 | ORAL_TABLET | Freq: Two times a day (BID) | ORAL | 0 refills | Status: DC

## 2017-05-10 NOTE — Progress Notes (Signed)
   Subjective: Sore throat     Patient ID: [name], female    DOB: August 31, 1985, 31 y.o.   MRN: [id number]  HPI Patient complaining of 3 days of frontal headache, nasal congestion, sore throat, ear pressure, and decreased voice volume. Patient denies fever/chills, nausea, vomiting, or diarrhea. Patient state mild transient relief using DayQuil and NyQuil.   Review of Systems Negative except for complaint    Objective:   Physical Exam HEENT remarkable for edematous nasal terms clear rhinorrhea bilateral maxillary guarding. Postnasal drainage and decreased voice volume. Neck is supple without adenopathy. Lungs CTA heart regular rate and rhythm.       Assessment & Plan: Pharyngitis and laryngitis   Patient given discharge care instructions. Patient given a prescription for Medrol Dosepak, Allegra-D, and Tessalon Perles. Patient advised for stress and the follow-up PCP if no improvement in 3-5 days.

## 2017-05-12 DIAGNOSIS — Z9884 Bariatric surgery status: Secondary | ICD-10-CM | POA: Diagnosis not present

## 2017-05-12 DIAGNOSIS — K219 Gastro-esophageal reflux disease without esophagitis: Secondary | ICD-10-CM | POA: Diagnosis not present

## 2017-05-12 DIAGNOSIS — R1319 Other dysphagia: Secondary | ICD-10-CM | POA: Diagnosis not present

## 2017-05-12 DIAGNOSIS — R1310 Dysphagia, unspecified: Secondary | ICD-10-CM | POA: Diagnosis not present

## 2017-05-12 DIAGNOSIS — R635 Abnormal weight gain: Secondary | ICD-10-CM | POA: Diagnosis not present

## 2017-05-12 DIAGNOSIS — R110 Nausea: Secondary | ICD-10-CM | POA: Diagnosis not present

## 2017-05-13 ENCOUNTER — Other Ambulatory Visit: Payer: Self-pay | Admitting: Family Medicine

## 2017-05-13 NOTE — Telephone Encounter (Signed)
Will clarify at OV tommorow

## 2017-05-13 NOTE — Telephone Encounter (Addendum)
Last office visit 03/19/2016.  Lisinopril not on medication list.  She is scheduled to be seen tomorrow for elevated BP.  Refill?

## 2017-05-14 ENCOUNTER — Ambulatory Visit (INDEPENDENT_AMBULATORY_CARE_PROVIDER_SITE_OTHER): Payer: 59 | Admitting: Family Medicine

## 2017-05-14 ENCOUNTER — Other Ambulatory Visit: Payer: Self-pay

## 2017-05-14 ENCOUNTER — Encounter: Payer: Self-pay | Admitting: Family Medicine

## 2017-05-14 VITALS — BP 120/90 | HR 77 | Temp 98.6°F | Ht 67.0 in | Wt 238.2 lb

## 2017-05-14 DIAGNOSIS — Z833 Family history of diabetes mellitus: Secondary | ICD-10-CM

## 2017-05-14 DIAGNOSIS — Z13220 Encounter for screening for lipoid disorders: Secondary | ICD-10-CM | POA: Diagnosis not present

## 2017-05-14 DIAGNOSIS — I10 Essential (primary) hypertension: Secondary | ICD-10-CM

## 2017-05-14 DIAGNOSIS — E119 Type 2 diabetes mellitus without complications: Secondary | ICD-10-CM

## 2017-05-14 LAB — HM DIABETES FOOT EXAM

## 2017-05-14 MED ORDER — LISINOPRIL 10 MG PO TABS: 10.0000 mg | ORAL_TABLET | Freq: Every day | ORAL | 3 refills | Status: DC

## 2017-05-14 NOTE — Patient Instructions (Addendum)
Please stop at the lab to have labs drawn. Restart lisinopril daily.  Work on low salt diet.  Hold off on using phentermine.  Check blood pressure daily .Marland Kitchen. Goal < 140/90.

## 2017-05-14 NOTE — Progress Notes (Signed)
Subjective:    Patient ID: [name], female    DOB: 07/14/1985, 31 y.o.   MRN: [id number]  HPI    31 year old female presents for follow up HTN, DM Last OV 03/2016   Hypertension:   She has noted elevated lately  At home and at Dr. visits BP Readings from Last 3 Encounters:  05/14/17 120/90  05/10/17 (!) 158/102  03/25/17 (!) 144/90  Using medication without problems or lightheadedness:  none  Has daily headache Chest pain with exertion: none Edema: none Short of breath: none Average home BPs: Other issues:  She is s/p bariatric surgery in 2017 She has gained  10-15 lbs in last year  bariatric surgeon recommended phentermine to lose weight .  Body mass index is 37.32 kg/m. Wt Readings from Last 3 Encounters:  05/14/17 238 lb 4 oz (108.1 kg)  03/25/17 211 lb (95.7 kg)  01/29/17 208 lb (94.3 kg)   Diabetes:  On no medications. Resolved after bariatric surgery last year.. Over due for re-eval. Feet problems: none Blood Sugars averaging: not checking eye exam within last year:  lencrafters Lab Results  Component Value Date   HGBA1C 6.1 03/16/2016     Social History /Family History/Past Medical History reviewed in detail and updated in EMR if needed. Blood pressure 120/90, pulse 77, temperature 98.6 F (37 C), temperature source Oral, height 5\' 7"  (1.702 m), weight 238 lb 4 oz (108.1 kg).  Body mass index is 37.32 kg/m.   Review of Systems  Constitutional: Negative for fatigue and fever.  HENT: Negative for ear pain.   Eyes: Negative for pain.  Respiratory: Negative for chest tightness and shortness of breath.   Cardiovascular: Negative for chest pain, palpitations and leg swelling.  Gastrointestinal: Negative for abdominal pain.  Genitourinary: Negative for dysuria.       Objective:   Physical Exam  Constitutional: Vital signs are normal. She appears well-developed and well-nourished. She is cooperative.  Non-toxic appearance. She does not appear  ill. No distress.  Obesity   HENT:  Head: Normocephalic.  Right Ear: Hearing, tympanic membrane, external ear and ear canal normal. Tympanic membrane is not erythematous, not retracted and not bulging.  Left Ear: Hearing, tympanic membrane, external ear and ear canal normal. Tympanic membrane is not erythematous, not retracted and not bulging.  Nose: No mucosal edema or rhinorrhea. Right sinus exhibits no maxillary sinus tenderness and no frontal sinus tenderness. Left sinus exhibits no maxillary sinus tenderness and no frontal sinus tenderness.  Mouth/Throat: Uvula is midline, oropharynx is clear and moist and mucous membranes are normal.  Eyes: Conjunctivae, EOM and lids are normal. Pupils are equal, round, and reactive to light. Lids are everted and swept, no foreign bodies found.  Neck: Trachea normal and normal range of motion. Neck supple. Carotid bruit is not present. No thyroid mass and no thyromegaly present.  Cardiovascular: Normal rate, regular rhythm, S1 normal, S2 normal, normal heart sounds, intact distal pulses and normal pulses. Exam reveals no gallop and no friction rub.  No murmur heard. Pulmonary/Chest: Effort normal and breath sounds normal. No tachypnea. No respiratory distress. She has no decreased breath sounds. She has no wheezes. She has no rhonchi. She has no rales.  Abdominal: Soft. Normal appearance and bowel sounds are normal. There is no tenderness.  Neurological: She is alert.  Skin: Skin is warm, dry and intact. No rash noted.  Psychiatric: Her speech is normal and behavior is normal. Judgment and thought content normal.  Her mood appears not anxious. Cognition and memory are normal. She does not exhibit a depressed mood.    Diabetic foot exam: Normal inspection No skin breakdown No calluses  Normal DP pulses Normal sensation to light touch and monofilament Nails normal       Assessment & Plan:

## 2017-05-15 LAB — COMPREHENSIVE METABOLIC PANEL
AG RATIO: 1.5 (calc) (ref 1.0–2.5)
ALBUMIN MSPROF: 4 g/dL (ref 3.6–5.1)
ALKALINE PHOSPHATASE (APISO): 62 U/L (ref 33–115)
ALT: 12 U/L (ref 6–29)
AST: 10 U/L (ref 10–30)
BILIRUBIN TOTAL: 0.2 mg/dL (ref 0.2–1.2)
BUN: 11 mg/dL (ref 7–25)
CHLORIDE: 105 mmol/L (ref 98–110)
CO2: 26 mmol/L (ref 20–32)
CREATININE: 0.88 mg/dL (ref 0.50–1.10)
Calcium: 9.1 mg/dL (ref 8.6–10.2)
GLOBULIN: 2.7 g/dL (ref 1.9–3.7)
Glucose, Bld: 99 mg/dL (ref 65–99)
POTASSIUM: 4.4 mmol/L (ref 3.5–5.3)
Sodium: 139 mmol/L (ref 135–146)
TOTAL PROTEIN: 6.7 g/dL (ref 6.1–8.1)

## 2017-05-15 LAB — HEMOGLOBIN A1C
HEMOGLOBIN A1C: 6.5 %{Hb} — AB (ref <5.7)
MEAN PLASMA GLUCOSE: 140 (calc)
eAG (mmol/L): 7.7 (calc)

## 2017-05-15 LAB — CBC WITH DIFFERENTIAL/PLATELET
BASOS PCT: 0.5 %
Basophils Absolute: 49 cells/uL (ref 0–200)
EOS ABS: 175 {cells}/uL (ref 15–500)
Eosinophils Relative: 1.8 %
HEMATOCRIT: 32.4 % — AB (ref 35.0–45.0)
HEMOGLOBIN: 10.4 g/dL — AB (ref 11.7–15.5)
Lymphs Abs: 3250 cells/uL (ref 850–3900)
MCH: 23.4 pg — AB (ref 27.0–33.0)
MCHC: 32.1 g/dL (ref 32.0–36.0)
MCV: 72.8 fL — AB (ref 80.0–100.0)
MPV: 10.3 fL (ref 7.5–12.5)
Monocytes Relative: 7.5 %
NEUTROS ABS: 5500 {cells}/uL (ref 1500–7800)
Neutrophils Relative %: 56.7 %
Platelets: 367 10*3/uL (ref 140–400)
RBC: 4.45 10*6/uL (ref 3.80–5.10)
RDW: 14 % (ref 11.0–15.0)
Total Lymphocyte: 33.5 %
WBC: 9.7 10*3/uL (ref 3.8–10.8)
WBCMIX: 728 {cells}/uL (ref 200–950)

## 2017-05-15 LAB — TSH: TSH: 0.85 mIU/L

## 2017-05-15 LAB — LIPID PANEL
CHOL/HDL RATIO: 3.6 (calc) (ref <5.0)
CHOLESTEROL: 142 mg/dL (ref <200)
HDL: 39 mg/dL — AB (ref >50)
LDL Cholesterol (Calc): 86 mg/dL (calc)
Non-HDL Cholesterol (Calc): 103 mg/dL (calc) (ref <130)
TRIGLYCERIDES: 82 mg/dL (ref <150)

## 2017-05-18 ENCOUNTER — Encounter: Payer: Self-pay | Admitting: Family Medicine

## 2017-05-18 DIAGNOSIS — N920 Excessive and frequent menstruation with regular cycle: Secondary | ICD-10-CM | POA: Insufficient documentation

## 2017-05-18 DIAGNOSIS — D509 Iron deficiency anemia, unspecified: Secondary | ICD-10-CM | POA: Insufficient documentation

## 2017-05-21 ENCOUNTER — Telehealth: Payer: Self-pay | Admitting: Obstetrics and Gynecology

## 2017-05-21 NOTE — Telephone Encounter (Signed)
-----   Message from Mychart, Generic sent at 05/20/2017 9:44 AM EST -----    Appointment Request From: [name]    With Provider: Vena AustriaAndreas Staebler, MD [[facility]]    Preferred Date Range: 05/28/2017 - 05/28/2017    Preferred Times: Friday Morning    Reason for visit: Office Visit    Comments:  My Primary Care doctor requested that I see my Ob-gyn doctor because my iron levels are really low and I am having super heavy menstrual periods then usually...    Called left voicemail

## 2017-05-26 NOTE — Telephone Encounter (Signed)
Pt is schedule 06/09/17 with AMS °

## 2017-05-28 ENCOUNTER — Ambulatory Visit: Payer: Self-pay | Admitting: Family Medicine

## 2017-06-09 ENCOUNTER — Ambulatory Visit: Payer: 59 | Admitting: Obstetrics and Gynecology

## 2017-06-17 ENCOUNTER — Telehealth: Payer: Self-pay

## 2017-06-17 NOTE — Telephone Encounter (Signed)
Pt returned call and requests letter be faxed to Buzzy HanWendy Swain at [phone].  Faxed.

## 2017-06-17 NOTE — Telephone Encounter (Signed)
Pt calling for proof of physical she had on 10/07/16.  [phone].  Left msg c coworker to call me back.

## 2017-10-11 ENCOUNTER — Ambulatory Visit (INDEPENDENT_AMBULATORY_CARE_PROVIDER_SITE_OTHER): Payer: BLUE CROSS/BLUE SHIELD | Admitting: Obstetrics and Gynecology

## 2017-10-11 ENCOUNTER — Encounter: Payer: Self-pay | Admitting: Obstetrics and Gynecology

## 2017-10-11 DIAGNOSIS — N939 Abnormal uterine and vaginal bleeding, unspecified: Secondary | ICD-10-CM

## 2017-10-11 DIAGNOSIS — Z1239 Encounter for other screening for malignant neoplasm of breast: Secondary | ICD-10-CM

## 2017-10-11 DIAGNOSIS — Z01419 Encounter for gynecological examination (general) (routine) without abnormal findings: Secondary | ICD-10-CM

## 2017-10-11 DIAGNOSIS — Z1231 Encounter for screening mammogram for malignant neoplasm of breast: Secondary | ICD-10-CM | POA: Diagnosis not present

## 2017-10-11 DIAGNOSIS — Z113 Encounter for screening for infections with a predominantly sexual mode of transmission: Secondary | ICD-10-CM

## 2017-10-11 NOTE — Patient Instructions (Signed)
Add fiber supplement daily such as Benafiber, Metamucil, or Citrucel Add a daily stool softener such as colace

## 2017-10-11 NOTE — Progress Notes (Signed)
Gynecology Annual Exam   PCP: Excell Seltzer, MD  Chief Complaint:  Chief Complaint  Patient presents with  . Gynecologic Exam    monthly cramping/spotting  . Anemia    light headed/dizzy/fatigue    History of Present Illness: Patient is a 32 y.o. G2P1011 presents for annual exam. The patient has no complaints today.   LMP: No LMP recorded. Patient has had an implant.  Continues to have irregular menses with Nexplanon.  Ultrasound last year revealed a structural normal uterus with 3mm endometrial stripe.  The patient is sexually active. She currently uses Nexplanon for contraception. She denies dyspareunia.  The patient does perform self breast exams.  There is no notable family history of breast or ovarian cancer in her family.  The patient wears seatbelts: yes.   The patient has regular exercise: not asked.    The patient denies current symptoms of depression.    Review of Systems: Review of Systems  Constitutional: Negative.   HENT: Negative.   Eyes: Negative.   Respiratory: Negative.   Cardiovascular: Negative.   Gastrointestinal: Negative.   Genitourinary: Negative.   Musculoskeletal: Negative.   Skin: Negative.   Neurological: Positive for headaches. Negative for dizziness, tingling, tremors, sensory change, speech change, focal weakness, seizures, loss of consciousness and weakness.  Endo/Heme/Allergies: Negative.   Psychiatric/Behavioral: Negative.   + Breast tenderness  Past Medical History:  Past Medical History:  Diagnosis Date  . Diabetes mellitus without complication (HCC)    before gastric sleeve procedure  . GERD (gastroesophageal reflux disease)   . Headache    when BP is up  . Hypertension   . Wears contact lenses     Past Surgical History:  Past Surgical History:  Procedure Laterality Date  . CESAREAN SECTION  04/30/2009   decreased fetal heart tones due to umbilical cord wrapped  . CHOLECYSTECTOMY    . ESOPHAGOGASTRODUODENOSCOPY (EGD)  WITH PROPOFOL N/A 10/24/2015   Procedure: ESOPHAGOGASTRODUODENOSCOPY (EGD) WITH PROPOFOL;  Surgeon: Midge Minium, MD;  Location: [facility];  Service: Endoscopy;  Laterality: N/A;  . LAPAROSCOPIC GASTRIC SLEEVE RESECTION N/A 03/19/2015   Procedure: LAPAROSCOPIC GASTRIC SLEEVE RESECTION;  Surgeon: Everette Rank, MD;  Location: [facility];  Service: General;  Laterality: N/A;    Gynecologic History:  No LMP recorded. Patient has had an implant. Contraception: 08/30/2015 Nexplanon Last Pap: Results were: 08/28/2015 no abnormalities   Obstetric History: G2P1011  Family History:  Family History  Problem Relation Age of Onset  . Diabetes Father   . Hypertension Father   . Hyperlipidemia Father   . Hypertension Sister   . Birth defects Sister        fluid on brain cause blindness in B eye    Social History:  Social History   Socioeconomic History  . Marital status: Single    Spouse name: Not on file  . Number of children: Not on file  . Years of education: Not on file  . Highest education level: Not on file  Occupational History  . Not on file  Social Needs  . Financial resource strain: Not on file  . Food insecurity:    Worry: Not on file    Inability: Not on file  . Transportation needs:    Medical: Not on file    Non-medical: Not on file  Tobacco Use  . Smoking status: Never Smoker  . Smokeless tobacco: Never Used  Substance and Sexual Activity  . Alcohol use: No  Comment: 1 drink/mo  . Drug use: No  . Sexual activity: Yes    Partners: Male    Birth control/protection: Implant    Comment: Nutraplanada  Lifestyle  . Physical activity:    Days per week: 3 days    Minutes per session: 60 min  . Stress: Only a little  Relationships  . Social connections:    Talks on phone: Not on file    Gets together: Not on file    Attends religious service: Not on file    Active member of club or organization: Not on file    Attends meetings of clubs or  organizations: Not on file    Relationship status: Not on file  . Intimate partner violence:    Fear of current or ex partner: Not on file    Emotionally abused: Not on file    Physically abused: Not on file    Forced sexual activity: Not on file  Other Topics Concern  . Not on file  Social History Narrative   Single, on child age 65.    Allergies:  No Known Allergies  Medications: Prior to Admission medications   Medication Sig Start Date End Date Taking? Authorizing Provider  etonogestrel (NEXPLANON) 68 MG IMPL implant 1 each by Subdermal route once.    [provider]  lisinopril (PRINIVIL,ZESTRIL) 10 MG tablet Take 1 tablet (10 mg total) by mouth daily. 05/14/17   Excell Seltzer, MD  phentermine (ADIPEX-P) 37.5 MG tablet  05/12/17   [provider]    Physical Exam Blood pressure 122/70, pulse 71, height  (1.702 m), weight 231 lb (104.8 kg). Body mass index is 36.18 kg/m. - 6lbs since last visit  General: NAD HEENT: normocephalic, anicteric Thyroid: no enlargement, no palpable nodules Pulmonary: No increased work of breathing, CTAB Cardiovascular: RRR, distal pulses 2+ Breast: Breast symmetrical, no tenderness, no palpable nodules or masses, no skin or nipple retraction present, no nipple discharge.  No axillary or supraclavicular lymphadenopathy. Abdomen: NABS, soft, non-tender, non-distended.  Umbilicus without lesions.  No hepatomegaly, splenomegaly or masses palpable. No evidence of hernia  Genitourinary:  External: Normal external female genitalia.  Normal urethral meatus, normal Bartholin's and Skene's glands.    Vagina: Normal vaginal mucosa, no evidence of prolapse.    Cervix: Grossly normal in appearance, no bleeding  Uterus: Non-enlarged, mobile, normal contour.  No CMT  Adnexa: ovaries non-enlarged, no adnexal masses  Rectal: deferred  Lymphatic: no evidence of inguinal lymphadenopathy Extremities: no edema, erythema, or  tenderness Neurologic: Grossly intact Psychiatric: mood appropriate, affect full  Female chaperone present for pelvic and breast  portions of the physical exam    Assessment: 32 y.o. G2P1011 routine annual exam  Plan: Problem List Items Addressed This Visit    None    Visit Diagnoses    Breast screening       Encounter for gynecological examination without abnormal finding       Abnormal uterine bleeding (AUB)       Routine screening for STI (sexually transmitted infection)       Relevant Orders   Chlamydia/Gonococcus/Trichomonas, NAA   HEP, RPR, HIV Panel      2) STI screening  wasoffered and accepted  2)  ASCCP guidelines and rational discussed.  Patient opts for every 3 years screening interval  3) Contraception - the patient is currently using  Nexplanon.  Was given handout on Mirena discussed better cycle control  4) Routine healthcare maintenance including cholesterol, diabetes  screening discussed managed by PCP  5) Return in 1 year (on 10/12/2018).   Vena Austria, MD, [facility], [facility] 10/11/2017, 1:37 PM

## 2017-10-12 ENCOUNTER — Encounter (INDEPENDENT_AMBULATORY_CARE_PROVIDER_SITE_OTHER): Payer: Self-pay

## 2017-10-12 LAB — HEP, RPR, HIV PANEL
HEP B S AG: NEGATIVE
HIV Screen 4th Generation wRfx: NONREACTIVE
RPR Ser Ql: NONREACTIVE

## 2017-10-15 LAB — CHLAMYDIA/GONOCOCCUS/TRICHOMONAS, NAA
Chlamydia by NAA: NEGATIVE
Gonococcus by NAA: NEGATIVE
Trich vag by NAA: NEGATIVE

## 2017-10-18 ENCOUNTER — Encounter (INDEPENDENT_AMBULATORY_CARE_PROVIDER_SITE_OTHER): Payer: Self-pay

## 2018-01-06 IMAGING — CT CT RENAL STONE PROTOCOL
2 of 4 series · 16 of 46 positions shown, 18 images · non-contrast
Comparison: 04/05/2015

CLINICAL DATA: Right flank pain starting today.

EXAM:
CT ABDOMEN AND PELVIS WITHOUT CONTRAST
TECHNIQUE: Multidetector CT imaging of the abdomen and pelvis was performed
following the standard protocol without IV contrast.

[Series 2: axial st · axial · 0.83mm/px · z∈[-564,-79]mm · 13 of 107 slices shown, 15 images]
[im 5/107  soft-tissue]
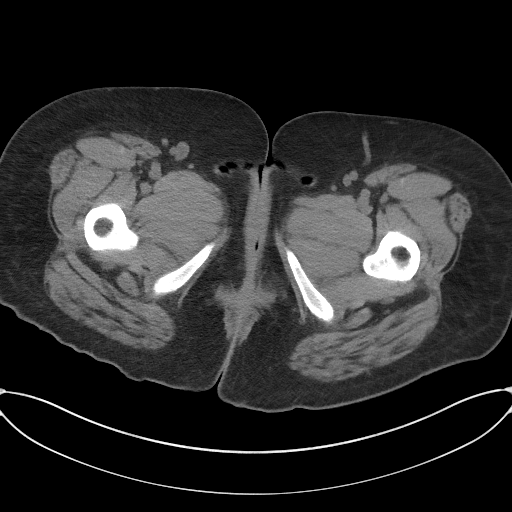
[im 5/107  bone]
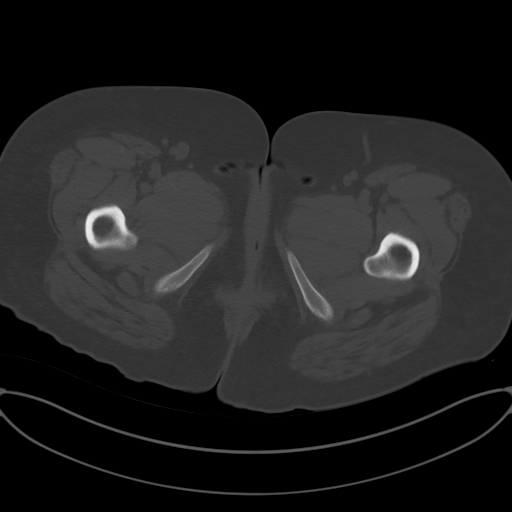
[im 13/107  soft-tissue]
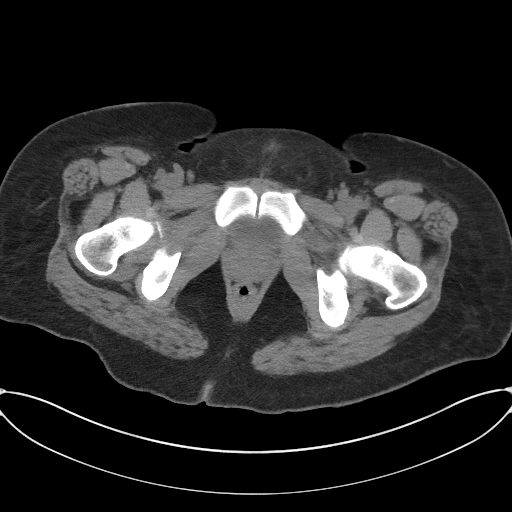
[im 22/107  soft-tissue]
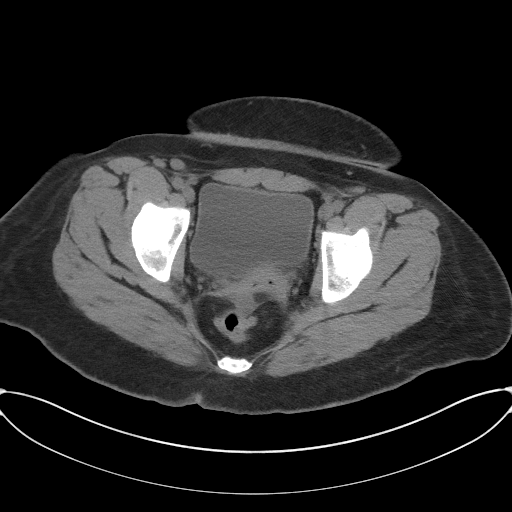
[im 30/107  soft-tissue]
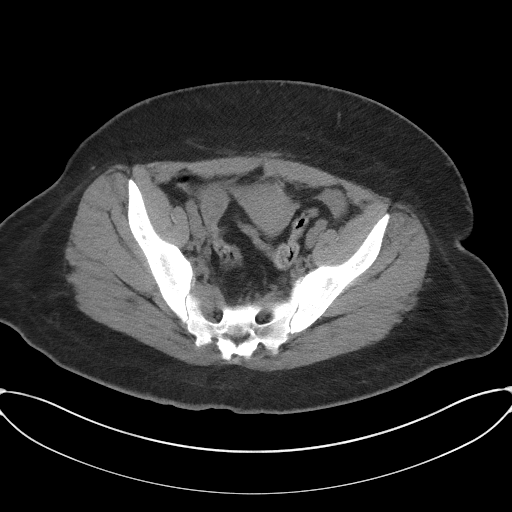
[im 39/107  soft-tissue]
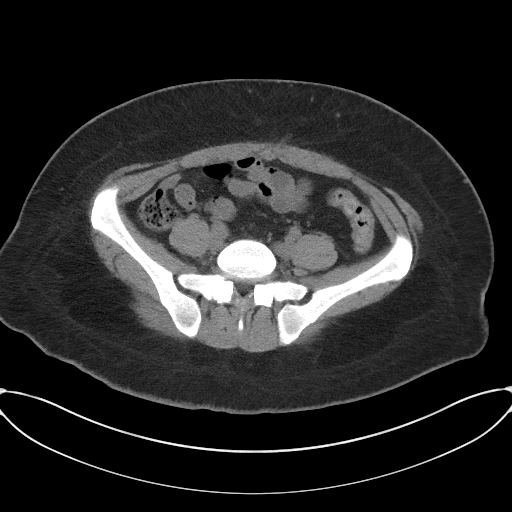
[im 47/107  soft-tissue]
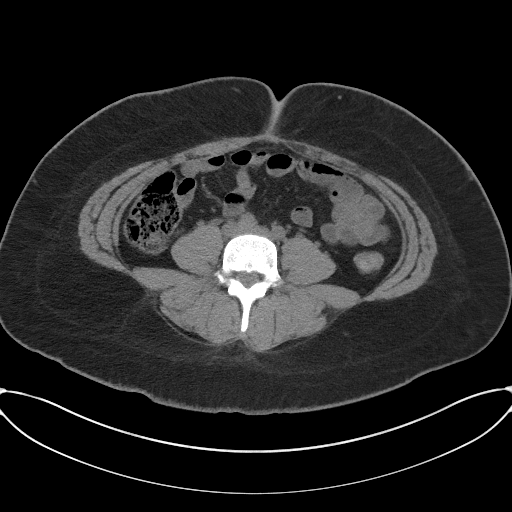
[im 56/107  soft-tissue]
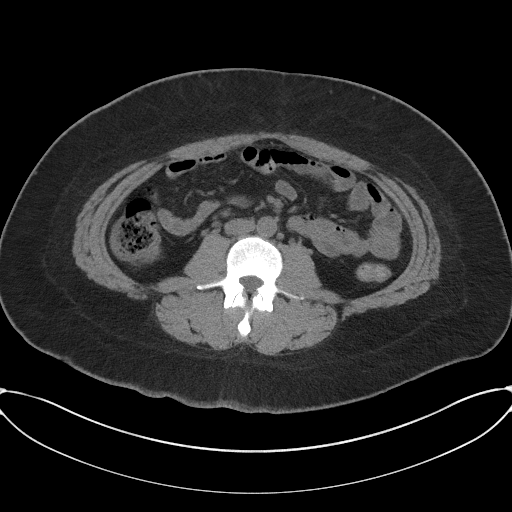
[im 60/107  soft-tissue]
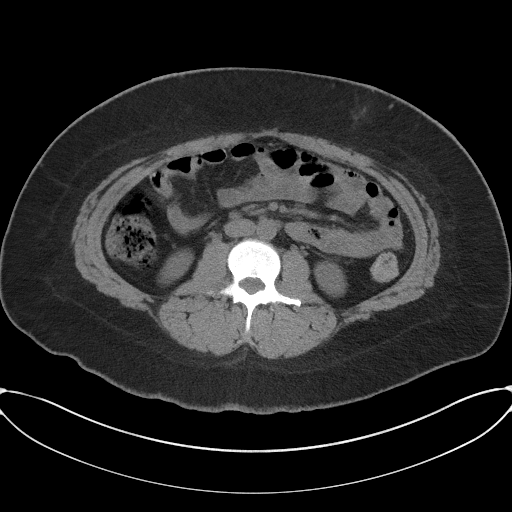
[im 68/107  soft-tissue]
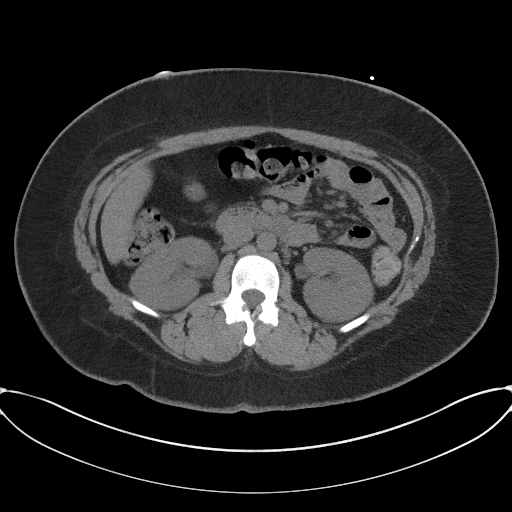
[im 68/107  bone]
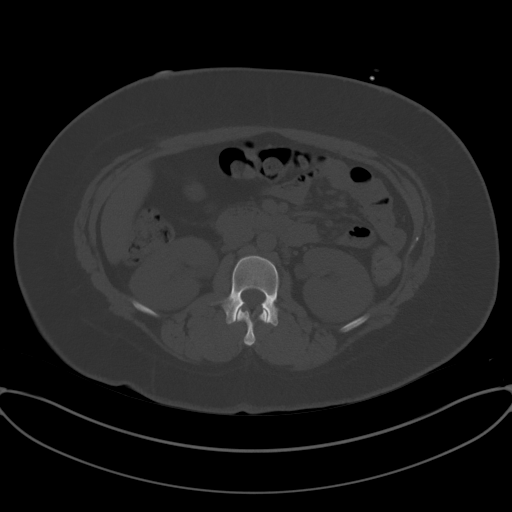
[im 77/107  soft-tissue]
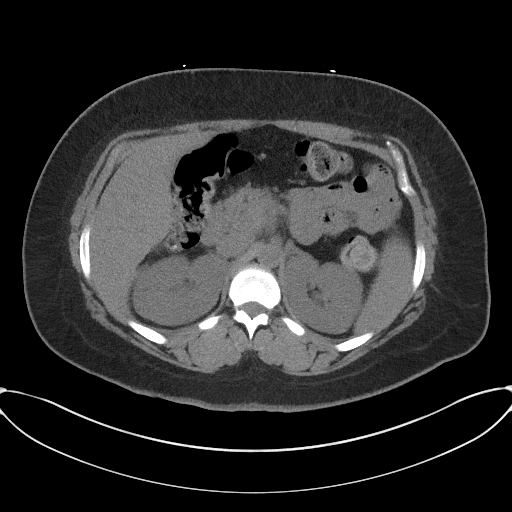
[im 85/107  soft-tissue]
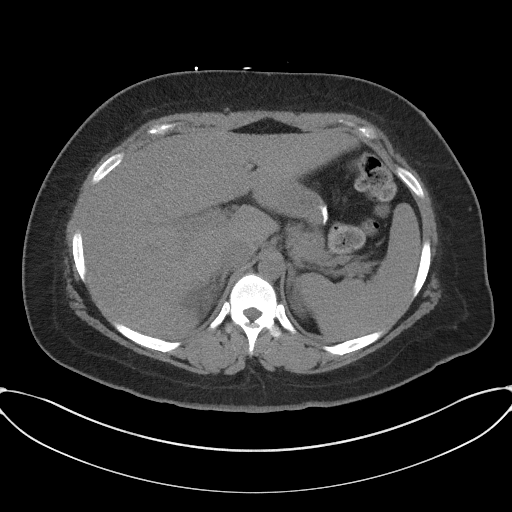
[im 94/107  soft-tissue]
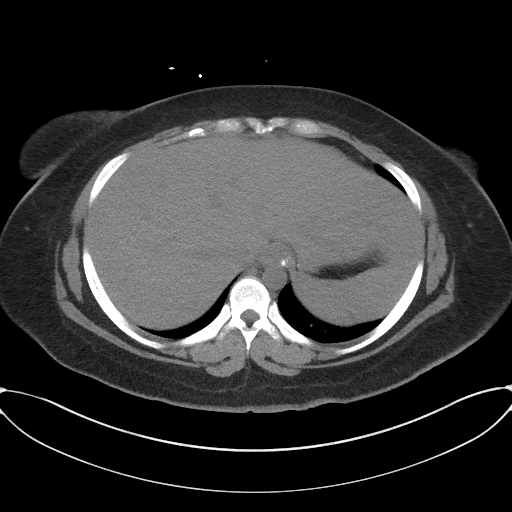
[im 102/107  soft-tissue]
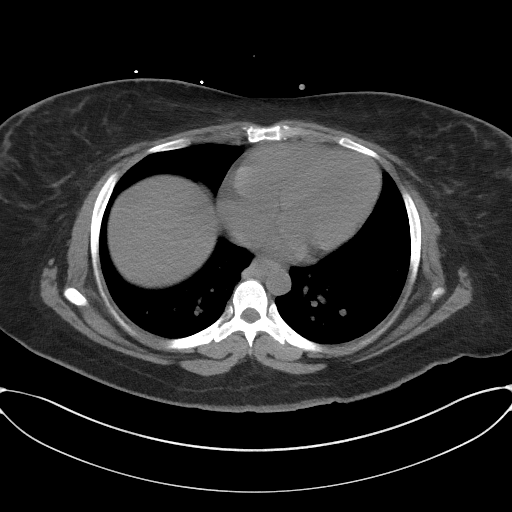

[Series 5: coronal · coronal · 0.87mm/px · 3 of 121 slices shown]
[im 41/121  soft-tissue]
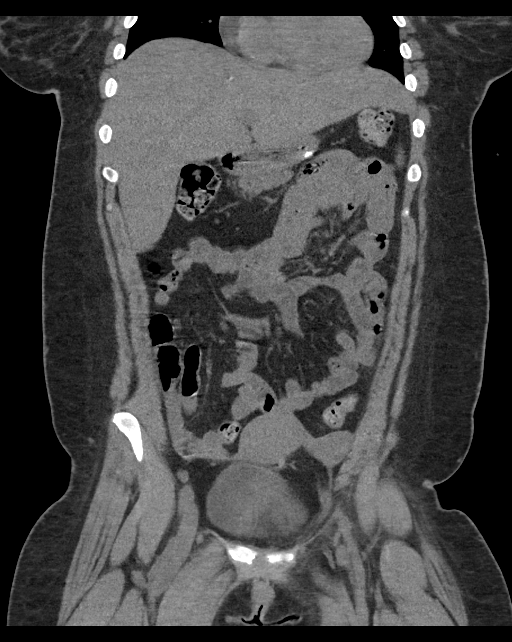
[im 54/121  soft-tissue]
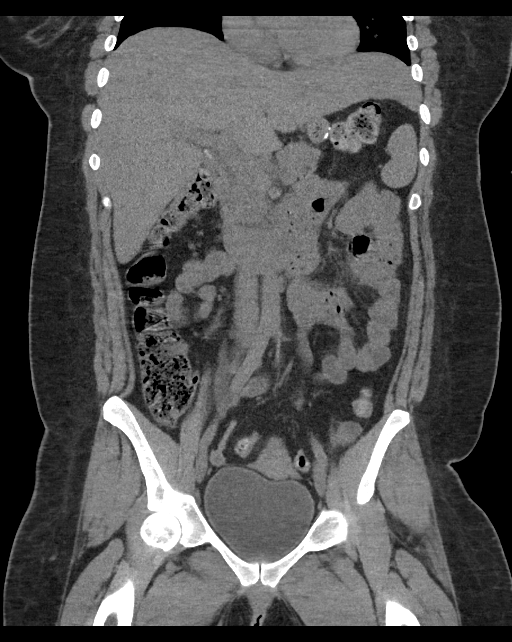
[im 67/121  soft-tissue]
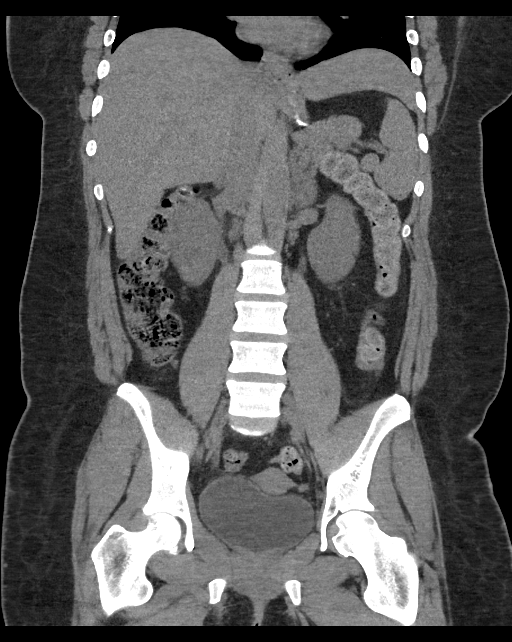

[16 of 46 positions shown; findings below may reference images not displayed]

FINDINGS: Lower chest:  Unremarkable

Hepatobiliary: No focal abnormality in the liver on this study
without intravenous contrast. Gallbladder surgically absent. No
intrahepatic or extrahepatic biliary dilation.

Pancreas: No focal mass lesion. No dilatation of the main duct. No
intraparenchymal cyst. No peripancreatic edema.

Spleen: No splenomegaly. No focal mass lesion.

Adrenals/Urinary Tract: No adrenal nodule or mass. No stones are
seen in either kidney. There is mild fullness of both intrarenal
collecting systems. Mild ureteral fullness is noted bilaterally. The
urinary bladder appears normal for the degree of distention.

Stomach/Bowel: Patient is status post gastric sleeve resection.
Duodenum is normally positioned as is the ligament of Treitz. No
small bowel wall thickening. No small bowel dilatation. The terminal
ileum is normal. The appendix is normal. No gross colonic mass. No
colonic wall thickening. No substantial diverticular change.

Vascular/Lymphatic: No abdominal aortic aneurysm. No abdominal
aortic atherosclerotic calcification. There is no gastrohepatic or
hepatoduodenal ligament lymphadenopathy. No intraperitoneal or
retroperitoneal lymphadenopathy. No pelvic sidewall lymphadenopathy.

Reproductive: The uterus has normal CT imaging appearance. There is
no adnexal mass.

Other: No intraperitoneal free fluid.

Musculoskeletal: Bone windows reveal no worrisome lytic or sclerotic
osseous lesions.
IMPRESSION: 1. Mild fullness of the intrarenal collecting systems and ureters
bilaterally without identifiable etiology. Features may be related
to hydration status. Correlation for urinary tract infection may
prove helpful.
2. Status post cholecystectomy.
3. Status post sleeve gastrectomy.

## 2018-01-13 ENCOUNTER — Other Ambulatory Visit: Payer: Self-pay

## 2018-01-13 ENCOUNTER — Emergency Department: Payer: BLUE CROSS/BLUE SHIELD

## 2018-01-13 ENCOUNTER — Emergency Department
Admission: EM | Admit: 2018-01-13 | Discharge: 2018-01-13 | Disposition: A | Discharge status: Home / Self Care | Payer: BLUE CROSS/BLUE SHIELD | Attending: Emergency Medicine | Admitting: Emergency Medicine

## 2018-01-13 DIAGNOSIS — M5412 Radiculopathy, cervical region: Secondary | ICD-10-CM | POA: Diagnosis not present

## 2018-01-13 DIAGNOSIS — I10 Essential (primary) hypertension: Secondary | ICD-10-CM | POA: Diagnosis not present

## 2018-01-13 DIAGNOSIS — Z79899 Other long term (current) drug therapy: Secondary | ICD-10-CM | POA: Insufficient documentation

## 2018-01-13 DIAGNOSIS — M542 Cervicalgia: Secondary | ICD-10-CM | POA: Diagnosis not present

## 2018-01-13 DIAGNOSIS — E119 Type 2 diabetes mellitus without complications: Secondary | ICD-10-CM | POA: Diagnosis not present

## 2018-01-13 MED ORDER — KETOROLAC TROMETHAMINE 30 MG/ML IJ SOLN
30.0000 mg | Freq: Once | INTRAMUSCULAR | Status: AC
Start: 2018-01-13 — End: 2018-01-13
  Administered 2018-01-13: 30 mg via INTRAMUSCULAR
  Filled 2018-01-13: qty 1

## 2018-01-13 MED ORDER — ORPHENADRINE CITRATE 30 MG/ML IJ SOLN
60.0000 mg | Freq: Once | INTRAMUSCULAR | Status: AC
  Administered 2018-01-13: 60 mg via INTRAMUSCULAR
  Filled 2018-01-13: qty 2

## 2018-01-13 MED ORDER — METHOCARBAMOL 500 MG PO TABS: 500.0000 mg | ORAL_TABLET | Freq: Every day | ORAL | 0 refills | Status: DC

## 2018-01-13 MED ORDER — MELOXICAM 15 MG PO TABS: 15.0000 mg | ORAL_TABLET | Freq: Every day | ORAL | 0 refills | Status: DC

## 2018-01-13 NOTE — ED Triage Notes (Signed)
Pt states L neck, L shoulder, L back pain x few months. States pain is causing HA. Unsure if injury. Alert, oriented, ambulatory. No distress noted.
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
## 2018-01-13 NOTE — ED Provider Notes (Signed)
[facility] Emergency Department Provider Note  ____________________________________________  Time seen: Approximately 5:44 PM  I have reviewed the triage vital signs and the nursing notes.   HISTORY  Chief Complaint Neck Pain and Back Pain    HPI [name] is a 32 y.o. female resents the emergency department complaining of left-sided neck pain and pain radiating down her left arm.  Patient reports that she has had symptoms for several months but they have been worsening.  She has taken Tylenol and Motrin intermittently for this symptom.  Sometimes this improves symptoms, sometimes does not seem to make much difference.  Patient denies any acute injury precipitating the symptoms.  No other complaints at this time.  She denies any headache, visual changes, chest pain, shortness of breath, abdominal pain, nausea vomiting.  Majority of her symptoms in her neck are on the left side.    Past Medical History:  Diagnosis Date  . Diabetes mellitus without complication (HCC)    before gastric sleeve procedure  . GERD (gastroesophageal reflux disease)   . Headache    when BP is up  . Hypertension   . Wears contact lenses     Patient Active Problem List   Diagnosis Date Noted  . Iron deficiency anemia 05/18/2017  . Menorrhagia 05/18/2017  . Sepsis (HCC) 05/21/2016  . Fatigue 03/19/2016  . Non-intractable cyclical vomiting with nausea   . Reflux esophagitis   . Abdominal pain, epigastric   . GERD (gastroesophageal reflux disease) 05/07/2015  . Bariatric surgery status 03/19/2015  . Class 2 obesity without serious comorbidity with body mass index (BMI) of 37.0 to 37.9 in adult 05/16/2013  . Type II diabetes mellitus, well controlled (HCC) 09/22/2012  . Hx of primary hypertension 09/22/2012    Past Surgical History:  Procedure Laterality Date  . CESAREAN SECTION  04/30/2009   decreased fetal heart tones due to umbilical cord wrapped  . CHOLECYSTECTOMY     . ESOPHAGOGASTRODUODENOSCOPY (EGD) WITH PROPOFOL N/A 10/24/2015   Procedure: ESOPHAGOGASTRODUODENOSCOPY (EGD) WITH PROPOFOL;  Surgeon: Midge Minium, MD;  Location: [facility];  Service: Endoscopy;  Laterality: N/A;  . LAPAROSCOPIC GASTRIC SLEEVE RESECTION N/A 03/19/2015   Procedure: LAPAROSCOPIC GASTRIC SLEEVE RESECTION;  Surgeon: Everette Rank, MD;  Location: [facility];  Service: General;  Laterality: N/A;    Prior to Admission medications   Medication Sig Start Date End Date Taking? Authorizing Provider  etonogestrel (NEXPLANON) 68 MG IMPL implant 1 each by Subdermal route once.    [provider]  lisinopril (PRINIVIL,ZESTRIL) 20 MG tablet Take by mouth.    [provider]  meloxicam (MOBIC) 15 MG tablet Take 1 tablet (15 mg total) by mouth daily. 01/13/18   Vannah Nadal, Delorise Royals, PA-C  methocarbamol (ROBAXIN) 500 MG tablet Take 1 tablet (500 mg total) by mouth at bedtime. 01/13/18   Raynee Mccasland, Delorise Royals, PA-C  phentermine (ADIPEX-P) 37.5 MG tablet  05/12/17   [provider]    Allergies Patient has no known allergies.  Family History  Problem Relation Age of Onset  . Diabetes Father   . Hypertension Father   . Hyperlipidemia Father   . Hypertension Sister   . Birth defects Sister        fluid on brain cause blindness in B eye    Social History Social History   Tobacco Use  . Smoking status: Never Smoker  . Smokeless tobacco: Never Used  Substance Use Topics  . Alcohol use: No  Comment: 1 drink/mo  . Drug use: No     Review of Systems  Constitutional: No fever/chills Eyes: No visual changes.  Cardiovascular: no chest pain. Respiratory: no cough. No SOB. Gastrointestinal: No abdominal pain.  No nausea, no vomiting.   Musculoskeletal: Positive for nontraumatic left-sided neck pain with radicular symptoms down the left arm Skin: Negative for rash, abrasions, lacerations, ecchymosis. Neurological: Negative for headaches, focal  weakness or numbness. 10-point ROS otherwise negative.  ____________________________________________   PHYSICAL EXAM:  VITAL SIGNS: ED Triage Vitals  Enc Vitals Group     BP 01/13/18 1727 138/87     Pulse Rate 01/13/18 1727 64     Resp 01/13/18 1727 20     Temp 01/13/18 1727 98.3 F (36.8 C)     Temp Source 01/13/18 1727 Oral     SpO2 01/13/18 1727 100 %     Weight 01/13/18 1728 218 lb (98.9 kg)     Height 01/13/18 1728 5\' 6"  (1.676 m)     Head Circumference --      Peak Flow --      Pain Score 01/13/18 1729 10     Pain Loc --      Pain Edu? --      Excl. in GC? --      Constitutional: Alert and oriented. Well appearing and in no acute distress. Eyes: Conjunctivae are normal. PERRL. EOMI. Head: Atraumatic. ENT:      Ears:       Nose: No congestion/rhinnorhea.      Mouth/Throat: Mucous membranes are moist.  Neck: No stridor.  No midline cervical spine tenderness to palpation.  Tenderness to palpation left-sided paraspinal muscle region, specifically C4-C6 region.  Radial pulse intact bilateral upper extremities.  Sensation intact and equal in all dermatomal distributions bilateral upper extremities. Cardiovascular: Normal rate, regular rhythm. Normal S1 and S2.  Good peripheral circulation. Respiratory: Normal respiratory effort without tachypnea or retractions. Lungs CTAB. Good air entry to the bases with no decreased or absent breath sounds. Musculoskeletal: Full range of motion to all extremities. No gross deformities appreciated. Neurologic:  Normal speech and language. No gross focal neurologic deficits are appreciated.  Skin:  Skin is warm, dry and intact. No rash noted. Psychiatric: Mood and affect are normal. Speech and behavior are normal. Patient exhibits appropriate insight and judgement.   ____________________________________________   LABS (all labs ordered are listed, but only abnormal results are displayed)  Labs Reviewed - No data to  display ____________________________________________  EKG   ____________________________________________  RADIOLOGY I personally viewed and evaluated these images as part of my medical decision making, as well as reviewing the written report by the radiologist.  I concur with radiologist finding of mild disc narrowing at C4, C5.  No acute fractures identified.  Dg Cervical Spine 2-3 Views  Result Date: 01/13/2018 CLINICAL DATA:  Left neck and shoulder as well as back pain for a few months. Pain is causing headaches. No known injury. EXAM: CERVICAL SPINE - 2-3 VIEW COMPARISON:  11/02/2014 FINDINGS: Five views of the cervical spine are provided. Slight disc space narrowing is identified at C4-5 relative to adjacent levels with tiny posterior osteophytes. No acute fracture or listhesis. No suspicious osseous lesions. No prevertebral soft tissue swelling is identified. No jumped or perched facets. IMPRESSION: There is slight disc space narrowing at C4-5 with tiny dorsal osteophytes noted. Findings are likely degenerative in etiology. No acute osseous abnormality. Electronically Signed   By: Rene Kocher.D.  On: 01/13/2018 18:33    ____________________________________________    PROCEDURES  Procedure(s) performed:    Procedures    Medications - No data to display   ____________________________________________   INITIAL IMPRESSION / ASSESSMENT AND PLAN / ED COURSE  Pertinent labs & imaging results that were available during my care of the patient were reviewed by me and considered in my medical decision making (see chart for details).  Review of the [place] CSRS was performed in accordance of the [facility] prior to dispensing any controlled drugs.      Patient's diagnosis is consistent with cervical radiculopathy.  Patient presents the emergency department with ongoing left-sided neck pain and radicular symptoms.  X-ray reveals no concerning findings warranting further investigation.   Patient is given Norflex and Toradol injection in the emergency department.. Patient will be discharged home with prescriptions for meloxicam and Robaxin. Patient is to follow up with orthopedics as needed or otherwise directed. Patient is given ED precautions to return to the ED for any worsening or new symptoms.     ____________________________________________  FINAL CLINICAL IMPRESSION(S) / ED DIAGNOSES  Final diagnoses:  Cervical radiculopathy      NEW MEDICATIONS STARTED DURING THIS VISIT:  ED Discharge Orders        Ordered    meloxicam (MOBIC) 15 MG tablet  Daily     01/13/18 1857    methocarbamol (ROBAXIN) 500 MG tablet  Daily at bedtime     01/13/18 1857          This chart was dictated using voice recognition software/Dragon. Despite best efforts to proofread, errors can occur which can change the meaning. Any change was purely unintentional.    Racheal PatchesCuthriell, Gabryela Kimbrell D, PA-C 01/13/18 1857    Jeanmarie PlantMcShane, James A, MD 01/13/18 [phone]

## 2018-01-16 DIAGNOSIS — E119 Type 2 diabetes mellitus without complications: Secondary | ICD-10-CM | POA: Diagnosis not present

## 2018-01-16 DIAGNOSIS — M791 Myalgia, unspecified site: Secondary | ICD-10-CM | POA: Diagnosis not present

## 2018-01-16 DIAGNOSIS — I10 Essential (primary) hypertension: Secondary | ICD-10-CM | POA: Diagnosis not present

## 2018-01-16 DIAGNOSIS — M25512 Pain in left shoulder: Secondary | ICD-10-CM | POA: Diagnosis not present

## 2018-01-16 DIAGNOSIS — M50321 Other cervical disc degeneration at C4-C5 level: Secondary | ICD-10-CM | POA: Diagnosis not present

## 2018-01-16 DIAGNOSIS — Z79899 Other long term (current) drug therapy: Secondary | ICD-10-CM | POA: Diagnosis not present

## 2018-01-16 DIAGNOSIS — M542 Cervicalgia: Secondary | ICD-10-CM | POA: Diagnosis not present

## 2018-01-16 DIAGNOSIS — Z794 Long term (current) use of insulin: Secondary | ICD-10-CM | POA: Diagnosis not present

## 2018-01-25 ENCOUNTER — Telehealth: Payer: Self-pay | Admitting: Family Medicine

## 2018-01-25 ENCOUNTER — Ambulatory Visit (INDEPENDENT_AMBULATORY_CARE_PROVIDER_SITE_OTHER): Payer: BLUE CROSS/BLUE SHIELD | Admitting: Family Medicine

## 2018-01-25 ENCOUNTER — Encounter: Payer: Self-pay | Admitting: Family Medicine

## 2018-01-25 ENCOUNTER — Telehealth: Payer: Self-pay

## 2018-01-25 VITALS — BP 140/102 | HR 81 | Temp 98.6°F | Ht 67.0 in | Wt 233.2 lb

## 2018-01-25 DIAGNOSIS — M5412 Radiculopathy, cervical region: Secondary | ICD-10-CM | POA: Insufficient documentation

## 2018-01-25 DIAGNOSIS — E119 Type 2 diabetes mellitus without complications: Secondary | ICD-10-CM

## 2018-01-25 MED ORDER — PREDNISONE 10 MG PO TABS: ORAL_TABLET | ORAL | 0 refills | Status: DC
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
## 2018-01-25 NOTE — Telephone Encounter (Signed)
-----   Message from Wendi MayaLauren Greeson, RT sent at 01/25/2018  2:27 PM EDT ----- Regarding: Lab orders for tomorrow 01/26/18 Patient is scheduled for lab tomorrow. She has an upcoming follow-up for diabetes. Please enter orders. Thanks-Lauren

## 2018-01-25 NOTE — Patient Instructions (Addendum)
Follow BP at pharmacy.. Call if remaining high once starting back lisinopril regularly. Can use Lidoderm patches as needed. Complete a course of prednisone x 1 week. Please stop at the front desk to set up referral.  Call if not improving as expected.

## 2018-01-25 NOTE — Assessment & Plan Note (Signed)
Stomach upset with NSAIDS.  50 % improvement so far.  Will treat with low dose taper prednisone, offered neck brace, refer to PT and Use lidoderm patches as needed.  Follow up if not improving in 2 weeks.

## 2018-01-25 NOTE — Telephone Encounter (Signed)
Patient was here today for neck pain and wanted to know if she needed to have restrictions due to this for her job to know. Please review. Thank you.  FYI: Patient is coming in on Friday 01/28/18 for diabetic follow up and she is scheduled for labs prior for tomorrow 01/26/18-Takeru Bose Ander PurpuraV Tequlia Gonsalves, RMA

## 2018-01-25 NOTE — Telephone Encounter (Signed)
Left message for Cassidy NonesLetitia that Dr. Ermalene SearingBedsole recommends no lifting > 10 lbs and no repetitive turning of head.  I ask that she call us back if she needs a note for work stating those restrictions.

## 2018-01-25 NOTE — Telephone Encounter (Signed)
No lifting > 10 lbs, no repetitive turning of head.

## 2018-01-26 ENCOUNTER — Other Ambulatory Visit (INDEPENDENT_AMBULATORY_CARE_PROVIDER_SITE_OTHER): Payer: BLUE CROSS/BLUE SHIELD

## 2018-01-26 DIAGNOSIS — E119 Type 2 diabetes mellitus without complications: Secondary | ICD-10-CM

## 2018-01-26 LAB — COMPREHENSIVE METABOLIC PANEL
ALBUMIN: 3.7 g/dL (ref 3.5–5.2)
ALK PHOS: 49 U/L (ref 39–117)
ALT: 12 U/L (ref 0–35)
AST: 11 U/L (ref 0–37)
BILIRUBIN TOTAL: 0.2 mg/dL (ref 0.2–1.2)
BUN: 12 mg/dL (ref 6–23)
CALCIUM: 9.2 mg/dL (ref 8.4–10.5)
CO2: 28 meq/L (ref 19–32)
CREATININE: 0.74 mg/dL (ref 0.40–1.20)
Chloride: 109 mEq/L (ref 96–112)
GFR: 118.68 mL/min (ref >60.00)
Glucose, Bld: 99 mg/dL (ref 70–99)
Potassium: 4 mEq/L (ref 3.5–5.1)
Sodium: 142 mEq/L (ref 135–145)
Total Protein: 6.7 g/dL (ref 6.0–8.3)

## 2018-01-26 LAB — HEMOGLOBIN A1C: Hgb A1c MFr Bld: 6.3 % (ref 4.6–6.5)

## 2018-01-26 LAB — LIPID PANEL
CHOL/HDL RATIO: 3
CHOLESTEROL: 132 mg/dL (ref 0–200)
HDL: 41.1 mg/dL (ref >39.00)
LDL Cholesterol: 77 mg/dL (ref 0–99)
NonHDL: 90.66
TRIGLYCERIDES: 68 mg/dL (ref 0.0–149.0)
VLDL: 13.6 mg/dL (ref 0.0–40.0)

## 2018-01-28 ENCOUNTER — Ambulatory Visit: Payer: BLUE CROSS/BLUE SHIELD | Admitting: Family Medicine

## 2018-01-28 DIAGNOSIS — Z0289 Encounter for other administrative examinations: Secondary | ICD-10-CM

## 2018-02-15 NOTE — Telephone Encounter (Signed)
Patient dropped off detailed message about dates for long term disability.  Patient is requesting form faxed.  Form needs to be completed by 03/14/18.  Patient can be contacted at [phone]. Letter is in RX tower.

## 2018-02-18 DIAGNOSIS — Z0279 Encounter for issue of other medical certificate: Secondary | ICD-10-CM

## 2018-02-23 NOTE — Telephone Encounter (Signed)
I went ahead and faxed your forms this evening. I have left a copy up front for you to pick up.

## 2018-03-17 ENCOUNTER — Ambulatory Visit: Payer: BLUE CROSS/BLUE SHIELD | Admitting: Obstetrics & Gynecology

## 2018-06-22 ENCOUNTER — Telehealth: Payer: Self-pay | Admitting: Obstetrics and Gynecology

## 2018-06-22 NOTE — Telephone Encounter (Signed)
Patient is schedule 08/25/18 with AMS for Nexplanon removal and Mirena insertion. Patient is also schedule 10/17/18 with AMS for yearly annual

## 2018-08-04 ENCOUNTER — Ambulatory Visit: Payer: BLUE CROSS/BLUE SHIELD | Admitting: Family Medicine

## 2018-08-25 ENCOUNTER — Ambulatory Visit: Payer: BLUE CROSS/BLUE SHIELD | Admitting: Obstetrics and Gynecology

## 2018-08-25 DIAGNOSIS — R05 Cough: Secondary | ICD-10-CM | POA: Diagnosis not present

## 2018-08-25 DIAGNOSIS — Z20828 Contact with and (suspected) exposure to other viral communicable diseases: Secondary | ICD-10-CM | POA: Diagnosis not present

## 2018-08-25 DIAGNOSIS — J029 Acute pharyngitis, unspecified: Secondary | ICD-10-CM | POA: Diagnosis not present

## 2018-08-25 DIAGNOSIS — R6889 Other general symptoms and signs: Secondary | ICD-10-CM | POA: Diagnosis not present

## 2018-08-26 DIAGNOSIS — D649 Anemia, unspecified: Secondary | ICD-10-CM | POA: Diagnosis not present

## 2018-08-31 ENCOUNTER — Ambulatory Visit (INDEPENDENT_AMBULATORY_CARE_PROVIDER_SITE_OTHER): Payer: BLUE CROSS/BLUE SHIELD | Admitting: Obstetrics and Gynecology

## 2018-08-31 ENCOUNTER — Other Ambulatory Visit: Payer: Self-pay

## 2018-08-31 VITALS — BP 137/90 | HR 73 | Ht 67.0 in | Wt 236.0 lb

## 2018-08-31 DIAGNOSIS — Z3049 Encounter for surveillance of other contraceptives: Secondary | ICD-10-CM | POA: Diagnosis not present

## 2018-08-31 DIAGNOSIS — Z3046 Encounter for surveillance of implantable subdermal contraceptive: Secondary | ICD-10-CM

## 2018-08-31 DIAGNOSIS — Z30017 Encounter for initial prescription of implantable subdermal contraceptive: Secondary | ICD-10-CM

## 2018-08-31 NOTE — Progress Notes (Signed)
   GYNECOLOGY PROCEDURE NOTE  Implanon removal discussed in detail.  Risks of infection, bleeding, nerve injury all reviewed.  Patient understands risks and desires to proceed.  Verbal consent obtained.  Patient is certain she wants the implanon removed and replaced.  She understands that Nexplanon is a progesterone only therapy, and that patients often patients have irregular and unpredictable vaginal bleeding or amenorrhea. She understands that other side effects are possible related to systemic progesterone, including but not limited to, headaches, breast tenderness, nausea, and irritability. While effective at preventing pregnancy long acting reversible contraceptives do not prevent transmission of sexually transmitted diseases and use of barrier methods for this purpose was discussed. The placement procedure for Nexplanon was reviewed with the patient in detail including risks of nerve injury, infection, bleeding and injury to other muscles or tendons. She understands that the Nexplanon implant is good for 3 years and needs to be removed at the end of that time.  She understands that Nexplanon is an extremely effective option for contraception, with failure rate of <1%. This information is reviewed today and all questions were answered. Informed consent was obtained, both verbally and written. All questions answered.  Procedure: Patient placed in dorsal supine with left arm above head, elbow flexed at 90 degrees, left arm resting on examination table.  Implanon identified without problems.  Betadine scrub x3.  1 ml of 1% lidocaine injected under implanon device without problems.  Sterile gloves applied.  Small 0.5cm incision made at distal tip of implanon device with 11 blade scalpel.  Implanon brought to incision and grasped with a small kelly clamp.  Implanon removed intact without problems.  Nexplanon removed form sterile blister packaging,  Device confirmed in needle, before inserting full length of  needle, tenting up the skin as the needle was advance.  The drug eluting rod was then deployed by pulling back the slider per the manufactures recommendation.  The implant was palpable by the clinician as well as the patient.  The insertion site covered dressed with a band aid before applying  a kerlex bandage pressure dressing..Minimal blood loss was noted during the procedure.  The patientt tolerated the procedure well.   She was instructed to wear the bandage for 24 hours, call with any signs of infection.  She was given the Implanon card and instructed to have the rod removed in 3 years.   Vena Austria, MD, [facility], [facility] 08/31/2018, 9:21 AM

## 2018-10-17 ENCOUNTER — Ambulatory Visit: Payer: BLUE CROSS/BLUE SHIELD | Admitting: Obstetrics and Gynecology

## 2018-11-10 ENCOUNTER — Other Ambulatory Visit: Payer: Self-pay

## 2018-11-10 ENCOUNTER — Encounter: Payer: Self-pay | Admitting: Family Medicine

## 2018-11-10 ENCOUNTER — Ambulatory Visit: Payer: BLUE CROSS/BLUE SHIELD | Admitting: Family Medicine

## 2018-11-10 ENCOUNTER — Encounter: Payer: Self-pay | Admitting: *Deleted

## 2018-11-10 ENCOUNTER — Ambulatory Visit (INDEPENDENT_AMBULATORY_CARE_PROVIDER_SITE_OTHER): Payer: BLUE CROSS/BLUE SHIELD | Admitting: Family Medicine

## 2018-11-10 VITALS — BP 120/84 | HR 80 | Temp 98.3°F | Ht 67.0 in | Wt 239.8 lb

## 2018-11-10 DIAGNOSIS — M7582 Other shoulder lesions, left shoulder: Secondary | ICD-10-CM | POA: Diagnosis not present

## 2018-11-10 DIAGNOSIS — M7062 Trochanteric bursitis, left hip: Secondary | ICD-10-CM | POA: Diagnosis not present

## 2018-11-10 MED ORDER — DICLOFENAC SODIUM 75 MG PO TBEC: 75.0000 mg | DELAYED_RELEASE_TABLET | Freq: Two times a day (BID) | ORAL | 0 refills | Status: DC

## 2018-11-10 NOTE — Patient Instructions (Signed)
Stop ibuprofen.  Start diclofenac  Twice daily with meals.  Start home PT for shoulder and hip.  Ice or heat on areas.  Limit repetitive bending/arm movements and no lifting > 10 lbs.  Call if not improving in 2 weeks.

## 2018-11-10 NOTE — Assessment & Plan Note (Signed)
NSAIDs ( diclofenac), heat and start home PT ( info given).  If not improving call for further eval or recommendations for formal PT or steroid injection.

## 2018-11-10 NOTE — Progress Notes (Signed)
Chief Complaint  Patient presents with  . Shoulder Pain    Left   . Hip Pain    Left    History of Present Illness: HPI   33 year old morbidly obese female with history of cervical radiculopathy and well controlled DM presents with new onset pain in left shoulder and left hip.  no falls, no change in activity  Both ongoing for 1 months. 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 More than previously.   1.left hip pain, worsening.  Pain ins lateral.  Pain when lying on left side hurts more, Ibuprofen helps a little.Marland Kitchen. 3-4 times a day, heating pad No numbness, no weakness  no pain with crossing legs.  2.left shoulder pain, anterior. Increase with arm in  Internal and ext rotation.  No numbness in arm, no weakness.  Some pain in neck but chronic not related on other side. No change in shoulder pain with neck movement. 600 mg ibuprofen helps a lot.    COVID 19 screen No recent travel or known exposure to COVID19 The patient denies respiratory symptoms of COVID 19 at this time.  The importance of social distancing was discussed today.   Review of Systems  Constitutional: Negative for chills and fever.  HENT: Negative for congestion and ear pain.   Eyes: Negative for pain and redness.  Respiratory: Negative for cough and shortness of breath.   Cardiovascular: Negative for chest pain, palpitations and leg swelling.  Gastrointestinal: Negative for abdominal pain, blood in stool, constipation, diarrhea, nausea and vomiting.  Genitourinary: Negative for dysuria.  Musculoskeletal: Negative for falls and myalgias.  Skin: Negative for rash.  Neurological: Negative for dizziness.  Psychiatric/Behavioral: Negative for depression. The patient is not nervous/anxious.       Past Medical History:  Diagnosis Date  . Diabetes mellitus without complication (HCC)    before gastric sleeve procedure  . GERD (gastroesophageal reflux disease)   . Headache    when BP is up   . Hypertension   . Wears contact lenses     reports that she has never smoked. She has never used smokeless tobacco. She reports that she does not drink alcohol or use drugs.   Current Outpatient Medications:  .  etonogestrel (NEXPLANON) 68 MG IMPL implant, 1 each by Subdermal route once., Disp: , Rfl:  .  ferrous sulfate 324 MG TBEC, Take 324 mg by mouth daily., Disp: , Rfl:  .  lisinopril (PRINIVIL,ZESTRIL) 20 MG tablet, Take 20 mg by mouth daily. , Disp: , Rfl:  .  Multiple Vitamins-Minerals (MULTIVITAMIN WOMEN PO), Take 1 tablet by mouth daily., Disp: , Rfl:    Observations/Objective: Blood pressure 120/84, pulse 80, temperature 98.3 F (36.8 C), temperature source Oral, height 5\' 7"  (1.702 m), weight 239 lb 12 oz (108.7 kg).  Physical Exam Constitutional:      General: She is not in acute distress.    Appearance: Normal appearance. She is well-developed. She is not ill-appearing or toxic-appearing.  HENT:     Head: Normocephalic.     Right Ear: Hearing, tympanic membrane, ear canal and external ear normal. Tympanic membrane is not erythematous, retracted or bulging.     Left Ear: Hearing, tympanic membrane, ear canal and external ear normal. Tympanic membrane is not erythematous, retracted or bulging.     Nose: No mucosal edema or rhinorrhea.     Right Sinus: No maxillary sinus tenderness or frontal sinus tenderness.     Left Sinus: No maxillary  sinus tenderness or frontal sinus tenderness.     Mouth/Throat:     Pharynx: Uvula midline.  Eyes:     General: Lids are normal. Lids are everted, no foreign bodies appreciated.     Conjunctiva/sclera: Conjunctivae normal.     Pupils: Pupils are equal, round, and reactive to light.  Neck:     Musculoskeletal: Normal range of motion and neck supple.     Thyroid: No thyroid mass or thyromegaly.     Vascular: No carotid bruit.     Trachea: Trachea normal.  Cardiovascular:     Rate and Rhythm: Normal rate and regular rhythm.      Pulses: Normal pulses.     Heart sounds: Normal heart sounds, S1 normal and S2 normal. No murmur. No friction rub. No gallop.   Pulmonary:     Effort: Pulmonary effort is normal. No tachypnea or respiratory distress.     Breath sounds: Normal breath sounds. No decreased breath sounds, wheezing, rhonchi or rales.  Abdominal:     General: Bowel sounds are normal.     Palpations: Abdomen is soft.     Tenderness: There is no abdominal tenderness.  Musculoskeletal:     Right shoulder: Normal.     Left shoulder: She exhibits decreased range of motion and tenderness. She exhibits no bony tenderness.     Right hip: Normal.     Left hip: She exhibits decreased range of motion and tenderness. She exhibits normal strength and no bony tenderness.     Cervical back: Normal. She exhibits normal range of motion and no tenderness.     Lumbar back: Normal. She exhibits normal range of motion and no tenderness.     Comments: Neg spurling, positive left neer's, neg drop arm   Neg Bilateral SLR, beg faber's, ttp over left bursa, no sciatic nothc pain.  Skin:    General: Skin is warm and dry.     Findings: No rash.  Neurological:     Mental Status: She is alert.  Psychiatric:        Mood and Affect: Mood is not anxious or depressed.        Speech: Speech normal.        Behavior: Behavior normal. Behavior is cooperative.        Thought Content: Thought content normal.        Judgment: Judgment normal.      Assessment and Plan   Rotator cuff tendinitis, left NSAIDs ( diclofenac), heat and start home PT ( info given).  if not improving call for further eval or recommendations for formal PT or steroid injection.  Greater trochanteric bursitis, left NSAIDs ( diclofenac), heat and start home PT ( info given).  If not improving call for further eval or recommendations for formal PT or steroid injection.     Kerby Nora, MD

## 2018-11-10 NOTE — Assessment & Plan Note (Signed)
NSAIDs ( diclofenac), heat and start home PT ( info given).  if not improving call for further eval or recommendations for formal PT or steroid injection.

## 2018-11-18 NOTE — Telephone Encounter (Signed)
Please write a note for patient staing not lifint greater than 10 lbs, no repetitive arm movements for 2 weeks.

## 2018-11-28 DIAGNOSIS — M79621 Pain in right upper arm: Secondary | ICD-10-CM | POA: Diagnosis not present

## 2018-11-28 DIAGNOSIS — M79601 Pain in right arm: Secondary | ICD-10-CM | POA: Diagnosis not present

## 2018-11-28 DIAGNOSIS — M79631 Pain in right forearm: Secondary | ICD-10-CM | POA: Diagnosis not present

## 2018-11-28 DIAGNOSIS — M25531 Pain in right wrist: Secondary | ICD-10-CM | POA: Diagnosis not present

## 2018-11-29 ENCOUNTER — Ambulatory Visit (INDEPENDENT_AMBULATORY_CARE_PROVIDER_SITE_OTHER): Payer: BC Managed Care – PPO | Admitting: Family Medicine

## 2018-11-29 ENCOUNTER — Other Ambulatory Visit: Payer: Self-pay

## 2018-11-29 ENCOUNTER — Encounter: Payer: Self-pay | Admitting: Family Medicine

## 2018-11-29 VITALS — BP 120/90 | HR 63 | Temp 97.1°F | Ht 67.0 in | Wt 229.0 lb

## 2018-11-29 DIAGNOSIS — M5412 Radiculopathy, cervical region: Secondary | ICD-10-CM | POA: Diagnosis not present

## 2018-11-29 DIAGNOSIS — M7582 Other shoulder lesions, left shoulder: Secondary | ICD-10-CM

## 2018-11-29 DIAGNOSIS — M7062 Trochanteric bursitis, left hip: Secondary | ICD-10-CM | POA: Diagnosis not present

## 2018-11-29 DIAGNOSIS — M654 Radial styloid tenosynovitis [de Quervain]: Secondary | ICD-10-CM | POA: Diagnosis not present

## 2018-11-29 DIAGNOSIS — M25531 Pain in right wrist: Secondary | ICD-10-CM | POA: Diagnosis not present

## 2018-11-29 DIAGNOSIS — E119 Type 2 diabetes mellitus without complications: Secondary | ICD-10-CM

## 2018-11-29 DIAGNOSIS — M2550 Pain in unspecified joint: Secondary | ICD-10-CM | POA: Diagnosis not present

## 2018-11-29 DIAGNOSIS — M5030 Other cervical disc degeneration, unspecified cervical region: Secondary | ICD-10-CM | POA: Diagnosis not present

## 2018-11-29 DIAGNOSIS — E669 Obesity, unspecified: Secondary | ICD-10-CM | POA: Diagnosis not present

## 2018-11-29 LAB — COMPREHENSIVE METABOLIC PANEL
ALT: 15 U/L (ref 0–35)
AST: 14 U/L (ref 0–37)
Albumin: 4.5 g/dL (ref 3.5–5.2)
Alkaline Phosphatase: 54 U/L (ref 39–117)
BUN: 8 mg/dL (ref 6–23)
CO2: 29 mEq/L (ref 19–32)
Calcium: 9.7 mg/dL (ref 8.4–10.5)
Chloride: 103 mEq/L (ref 96–112)
Creatinine, Ser: 0.8 mg/dL (ref 0.40–1.20)
GFR: 101.48 mL/min (ref >60.00)
Glucose, Bld: 86 mg/dL (ref 70–99)
Potassium: 3.7 mEq/L (ref 3.5–5.1)
Sodium: 140 mEq/L (ref 135–145)
Total Bilirubin: 0.3 mg/dL (ref 0.2–1.2)
Total Protein: 7.6 g/dL (ref 6.0–8.3)

## 2018-11-29 LAB — CBC WITH DIFFERENTIAL/PLATELET
Basophils Absolute: 0 10*3/uL (ref 0.0–0.1)
Basophils Relative: 0.7 % (ref 0.0–3.0)
Eosinophils Absolute: 0.1 10*3/uL (ref 0.0–0.7)
Eosinophils Relative: 2 % (ref 0.0–5.0)
HCT: 41.5 % (ref 36.0–46.0)
Hemoglobin: 13.5 g/dL (ref 12.0–15.0)
Lymphocytes Relative: 39.9 % (ref 12.0–46.0)
Lymphs Abs: 2.7 10*3/uL (ref 0.7–4.0)
MCHC: 32.4 g/dL (ref 30.0–36.0)
MCV: 75.8 fl — ABNORMAL LOW (ref 78.0–100.0)
Monocytes Absolute: 0.8 10*3/uL (ref 0.1–1.0)
Monocytes Relative: 11.5 % (ref 3.0–12.0)
Neutro Abs: 3.1 10*3/uL (ref 1.4–7.7)
Neutrophils Relative %: 45.9 % (ref 43.0–77.0)
Platelets: 331 10*3/uL (ref 150.0–400.0)
RBC: 5.47 Mil/uL — ABNORMAL HIGH (ref 3.87–5.11)
RDW: 17.4 % — ABNORMAL HIGH (ref 11.5–15.5)
WBC: 6.8 10*3/uL (ref 4.0–10.5)

## 2018-11-29 LAB — SEDIMENTATION RATE: Sed Rate: 26 mm/h — ABNORMAL HIGH (ref 0–20)

## 2018-11-29 LAB — LIPID PANEL
Cholesterol: 129 mg/dL (ref 0–200)
HDL: 35.1 mg/dL — ABNORMAL LOW
LDL Cholesterol: 77 mg/dL (ref 0–99)
NonHDL: 94.14
Total CHOL/HDL Ratio: 4
Triglycerides: 84 mg/dL (ref 0.0–149.0)
VLDL: 16.8 mg/dL (ref 0.0–40.0)

## 2018-11-29 LAB — HEMOGLOBIN A1C: Hgb A1c MFr Bld: 6.7 % — ABNORMAL HIGH (ref 4.6–6.5)

## 2018-11-29 NOTE — Assessment & Plan Note (Signed)
IMproving with NSAIDS

## 2018-11-29 NOTE — Addendum Note (Signed)
Addended by: Eliezer Lofts E on: 11/29/2018 04:54 PM   Modules accepted: Level of Service

## 2018-11-29 NOTE — Assessment & Plan Note (Signed)
IMproving with NSAIDS 

## 2018-11-29 NOTE — Assessment & Plan Note (Signed)
Pt concerned about why she has mulitple joint issues without clear cause.Marland Kitchen o fall , minimal cahnge in activity.  Weill eval with sed rate and cbc. Not consistent with RA

## 2018-11-29 NOTE — Patient Instructions (Addendum)
Continue naproxsyn.  Keep appt with ORTHO. Set up yearly eye exam

## 2018-11-29 NOTE — Assessment & Plan Note (Signed)
Dequervain's?  Ne X-ray... agree with referral to [facility].

## 2018-11-29 NOTE — Assessment & Plan Note (Signed)
Due for re-eval. Hss been well controlled since bariatric surgery

## 2018-11-29 NOTE — Progress Notes (Signed)
Chief Complaint  Patient presents with  . Shoulder Pain  . Hip Pain    History of Present Illness: HPI  33 year old femalewith history of cervical radiculopathy and well controlled DM presetns for leftt shoulder pain and  Left hip pain.   She was seen on 11/10/2018 for similar.  Dx with rotator cuff tendonitis and left trochanteric bursitis.   Treated with diclofenac, home PT  Pain improved  With this, but she  Has not been working.    1.left hip pain, worsening.  Pain ins lateral.  Pain when lying on left side hurts more, Ibuprofen helps a little.Marland Kitchen 3-4 times a day, heating pad No numbness, no weakness  no pain with crossing legs.  2.left shoulder pain, anterior. Increase with arm in  Internal and ext rotation.  No numbness in arm, no weakness.  Some pain in neck but chronic not related on other side. No change in shoulder pain with neck movement.    Saw [facility] for right wrist pain.  X-ray nml per pt.  Stopped  Diclofenac  And change to naproxsyn.   Rec: ice, elevation and referred to Ortho..has appt later today.  She is out on leave for right wrist pain.    DM.Marland Kitchen due for re-eval.  Not checking blood sugars at home.  COVID 19 screen No recent travel or known exposure to COVID19 The patient denies respiratory symptoms of COVID 19 at this time.  The importance of social distancing was discussed today.   Review of Systems  Constitutional: Negative for chills and fever.  HENT: Negative for congestion and ear pain.   Eyes: Negative for pain and redness.  Respiratory: Negative for cough and shortness of breath.   Cardiovascular: Negative for chest pain, palpitations and leg swelling.  Gastrointestinal: Negative for abdominal pain, blood in stool, constipation, diarrhea, nausea and vomiting.  Genitourinary: Negative for dysuria.  Musculoskeletal: Positive for joint pain. Negative for falls and myalgias.  Skin: Negative for rash.  Neurological: Negative for  dizziness.  Psychiatric/Behavioral: Negative for depression. The patient is not nervous/anxious.       Past Medical History:  Diagnosis Date  . Diabetes mellitus without complication ([place])    before gastric sleeve procedure  . GERD (gastroesophageal reflux disease)   . Headache    when BP is up  . Hypertension   . Wears contact lenses     reports that she has never smoked. She has never used smokeless tobacco. She reports that she does not drink alcohol or use drugs.   Current Outpatient Medications:  .  diclofenac (VOLTAREN) 75 MG EC tablet, Take 1 tablet (75 mg total) by mouth 2 (two) times daily., Disp: 30 tablet, Rfl: 0 .  etonogestrel (NEXPLANON) 68 MG IMPL implant, 1 each by Subdermal route once., Disp: , Rfl:  .  ferrous sulfate 324 MG TBEC, Take 324 mg by mouth daily., Disp: , Rfl:  .  lisinopril (PRINIVIL,ZESTRIL) 20 MG tablet, Take 20 mg by mouth daily. , Disp: , Rfl:  .  Multiple Vitamins-Minerals (MULTIVITAMIN WOMEN PO), Take 1 tablet by mouth daily., Disp: , Rfl:  .  naproxen (NAPROSYN) 500 MG tablet, Take 500 mg by mouth 2 (two) times a day., Disp: , Rfl:    Observations/Objective: Blood pressure 120/90, pulse 63, temperature (!) 97.1 F (36.2 C), temperature source Other (Comment), height 5\' 7"  (1.702 m), weight 229 lb (103.9 kg), SpO2 100 %.  Physical Exam Constitutional:      General: She is not  in acute distress.    Appearance: Normal appearance. She is well-developed. She is obese. She is not ill-appearing or toxic-appearing.  HENT:     Head: Normocephalic.     Right Ear: Hearing, tympanic membrane, ear canal and external ear normal. Tympanic membrane is not erythematous, retracted or bulging.     Left Ear: Hearing, tympanic membrane, ear canal and external ear normal. Tympanic membrane is not erythematous, retracted or bulging.     Nose: No mucosal edema or rhinorrhea.     Right Sinus: No maxillary sinus tenderness or frontal sinus tenderness.     Left Sinus:  No maxillary sinus tenderness or frontal sinus tenderness.     Mouth/Throat:     Pharynx: Uvula midline.  Eyes:     General: Lids are normal. Lids are everted, no foreign bodies appreciated.     Conjunctiva/sclera: Conjunctivae normal.     Pupils: Pupils are equal, round, and reactive to light.  Neck:     Musculoskeletal: Normal range of motion and neck supple.     Thyroid: No thyroid mass or thyromegaly.     Vascular: No carotid bruit.     Trachea: Trachea normal.  Cardiovascular:     Rate and Rhythm: Normal rate and regular rhythm.     Pulses: Normal pulses.     Heart sounds: Normal heart sounds, S1 normal and S2 normal. No murmur. No friction rub. No gallop.   Pulmonary:     Effort: Pulmonary effort is normal. No tachypnea or respiratory distress.     Breath sounds: Normal breath sounds. No decreased breath sounds, wheezing, rhonchi or rales.  Abdominal:     General: Bowel sounds are normal.     Palpations: Abdomen is soft.     Tenderness: There is no abdominal tenderness.  Musculoskeletal:     Left shoulder: She exhibits tenderness. She exhibits normal range of motion and no bony tenderness.     Right wrist: She exhibits decreased range of motion, tenderness, bony tenderness and swelling. She exhibits no effusion.     Left hip: She exhibits tenderness. She exhibits normal range of motion, normal strength and no bony tenderness.  Skin:    General: Skin is warm and dry.     Findings: No rash.  Neurological:     Mental Status: She is alert.  Psychiatric:        Mood and Affect: Mood is not anxious or depressed.        Speech: Speech normal.        Behavior: Behavior normal. Behavior is cooperative.        Thought Content: Thought content normal.        Judgment: Judgment normal.      Assessment and Plan    Acute pain of right wrist Dequervain's?  Ne X-ray... [facility]  Rotator cuff tendinitis, left IMproving with NSAIDS  Greater trochanteric  bursitis, left IMproving with NSAIDS    Kerby NoraAmy Marjarie Irion, MD

## 2018-12-01 ENCOUNTER — Other Ambulatory Visit: Payer: Self-pay

## 2018-12-01 ENCOUNTER — Ambulatory Visit (INDEPENDENT_AMBULATORY_CARE_PROVIDER_SITE_OTHER): Payer: BC Managed Care – PPO | Admitting: Obstetrics and Gynecology

## 2018-12-01 ENCOUNTER — Encounter: Payer: Self-pay | Admitting: Obstetrics and Gynecology

## 2018-12-01 ENCOUNTER — Other Ambulatory Visit (HOSPITAL_COMMUNITY)
Admission: RE | Admit: 2018-12-01 | Discharge: 2018-12-01 | Disposition: A | Discharge status: Home / Self Care | Payer: BC Managed Care – PPO | Source: Ambulatory Visit | Attending: Obstetrics and Gynecology | Admitting: Obstetrics and Gynecology

## 2018-12-01 VITALS — BP 140/93 | HR 74 | Ht 66.0 in | Wt 228.0 lb

## 2018-12-01 DIAGNOSIS — Z113 Encounter for screening for infections with a predominantly sexual mode of transmission: Secondary | ICD-10-CM | POA: Insufficient documentation

## 2018-12-01 DIAGNOSIS — Z124 Encounter for screening for malignant neoplasm of cervix: Secondary | ICD-10-CM

## 2018-12-01 DIAGNOSIS — Z1239 Encounter for other screening for malignant neoplasm of breast: Secondary | ICD-10-CM

## 2018-12-01 DIAGNOSIS — Z01419 Encounter for gynecological examination (general) (routine) without abnormal findings: Secondary | ICD-10-CM

## 2018-12-01 NOTE — Progress Notes (Signed)
Gynecology Annual Exam   PCP: Excell SeltzerBedsole, Amy E, MD  Chief Complaint:  Chief Complaint  Patient presents with  . Gynecologic Exam    History of Present Illness: Patient is a 33 y.o. G2P1011 presents for annual exam. The patient has no complaints today.   LMP: No LMP recorded. Patient has had an implant. Absent on nexplanon  The patient is sexually active. She currently uses Nexplanon for contraception. She denies dyspareunia.  There is no notable family history of breast or ovarian cancer in her family.  The patient wears seatbelts: yes.   The patient has regular exercise: not asked.    The patient denies current symptoms of depression.    Review of Systems: Review of Systems  Constitutional: Negative for chills and fever.  HENT: Negative for congestion.   Respiratory: Negative for cough and shortness of breath.   Cardiovascular: Negative for chest pain and palpitations.  Gastrointestinal: Negative for abdominal pain, constipation, diarrhea, heartburn, nausea and vomiting.  Genitourinary: Negative for dysuria, frequency and urgency.  Skin: Negative for itching and rash.  Neurological: Negative for dizziness and headaches.  Endo/Heme/Allergies: Negative for polydipsia.  Psychiatric/Behavioral: Negative for depression.    Past Medical History:  Past Medical History:  Diagnosis Date  . Diabetes mellitus without complication (HCC)    before gastric sleeve procedure  . GERD (gastroesophageal reflux disease)   . Headache    when BP is up  . Hypertension   . Wears contact lenses     Past Surgical History:  Past Surgical History:  Procedure Laterality Date  . CESAREAN SECTION  04/30/2009   decreased fetal heart tones due to umbilical cord wrapped  . CHOLECYSTECTOMY    . ESOPHAGOGASTRODUODENOSCOPY (EGD) WITH PROPOFOL N/A 10/24/2015   Procedure: ESOPHAGOGASTRODUODENOSCOPY (EGD) WITH PROPOFOL;  Surgeon: Midge Miniumarren Wohl, MD;  [facility];  Service: Endoscopy;   Laterality: N/A;  . LAPAROSCOPIC GASTRIC SLEEVE RESECTION N/A 03/19/2015   Procedure: LAPAROSCOPIC GASTRIC SLEEVE RESECTION;  Surgeon: Everette RankMichael A Tyner, MD;  Location: [facility];  Service: General;  Laterality: N/A;    Gynecologic History:  No LMP recorded. Patient has had an implant. Contraception:08/31/2018 Nexplanon Last Pap: Results were:08/28/2015 no abnormalities   Obstetric History: G2P1011  Family History:  Family History  Problem Relation Age of Onset  . Diabetes Father   . Hypertension Father   . Hyperlipidemia Father   . Hypertension Sister   . Birth defects Sister        fluid on brain cause blindness in B eye    Social History:  Social History   Socioeconomic History  . Marital status: Single    Spouse name: Not on file  . Number of children: Not on file  . Years of education: Not on file  . Highest education level: Not on file  Occupational History  . Not on file  Social Needs  . Financial resource strain: Not on file  . Food insecurity    Worry: Not on file    Inability: Not on file  . Transportation needs    Medical: Not on file    Non-medical: Not on file  Tobacco Use  . Smoking status: Never Smoker  . Smokeless tobacco: Never Used  Substance and Sexual Activity  . Alcohol use: No    Comment: 1 drink/mo  . Drug use: No  . Sexual activity: Yes    Partners: Male    Birth control/protection: Implant    Comment: Nutraplanada  Lifestyle  .  Physical activity    Days per week: 3 days    Minutes per session: 60 min  . Stress: Only a little  Relationships  . Social Herbalist on phone: Not on file    Gets together: Not on file    Attends religious service: Not on file    Active member of club or organization: Not on file    Attends meetings of clubs or organizations: Not on file    Relationship status: Not on file  . Intimate partner violence    Fear of current or ex partner: Not on file    Emotionally abused: Not on file     Physically abused: Not on file    Forced sexual activity: Not on file  Other Topics Concern  . Not on file  Social History Narrative   Single, on child age 89.    Allergies:  Allergies  Allergen Reactions  . Tomato Rash    Medications: Prior to Admission medications   Medication Sig Start Date End Date Taking? Authorizing Provider  diclofenac (VOLTAREN) 75 MG EC tablet Take 1 tablet (75 mg total) by mouth 2 (two) times daily. 11/10/18   Bedsole, Amy E, MD  etonogestrel (NEXPLANON) 68 MG IMPL implant 1 each by Subdermal route once.    [provider]  ferrous sulfate 324 MG TBEC Take 324 mg by mouth daily.    [provider]  lisinopril (PRINIVIL,ZESTRIL) 20 MG tablet Take 20 mg by mouth daily.     [provider]  Multiple Vitamins-Minerals (MULTIVITAMIN WOMEN PO) Take 1 tablet by mouth daily.    [provider]  naproxen (NAPROSYN) 500 MG tablet Take 500 mg by mouth 2 (two) times a day. 11/28/18 12/05/18  [provider]    Physical Exam Vitals: Blood pressure (!) 140/93, pulse 74, height 5\' 6"  (1.676 m), weight 228 lb (103.4 kg).   General: NAD HEENT: normocephalic, anicteric Thyroid: no enlargement, no palpable nodules Pulmonary: No increased work of breathing, CTAB Cardiovascular: RRR, distal pulses 2+ Breast: Breast symmetrical, no tenderness, no palpable nodules or masses, no skin or nipple retraction present, no nipple discharge.  No axillary or supraclavicular lymphadenopathy. Abdomen: NABS, soft, non-tender, non-distended.  Umbilicus without lesions.  No hepatomegaly, splenomegaly or masses palpable. No evidence of hernia  Genitourinary:  External: Normal external female genitalia.  Normal urethral meatus, normal Bartholin's and Skene's glands.    Vagina: Normal vaginal mucosa, no evidence of prolapse.    Cervix: Grossly normal in appearance, no bleeding  Uterus: Non-enlarged, mobile, normal contour.  No CMT  Adnexa: ovaries  non-enlarged, no adnexal masses  Rectal: deferred  Lymphatic: no evidence of inguinal lymphadenopathy Extremities: no edema, erythema, or tenderness Neurologic: Grossly intact Psychiatric: mood appropriate, affect full  Female chaperone present for pelvic and breast  portions of the physical exam    Assessment: 33 y.o. G2P1011 routine annual exam  Plan: Problem List Items Addressed This Visit    None    Visit Diagnoses    Encounter for gynecological examination without abnormal finding    -  Primary   Screening for malignant neoplasm of cervix       Relevant Orders   Cytology - PAP   Breast screening       Routine screening for STI (sexually transmitted infection)       Relevant Orders   Cytology - PAP   HEP, RPR, HIV Panel      1) STI screening  wasoffered  and accepted  2)  ASCCP guidelines and rational discussed.  Patient opts for every 3 years screening interval  3) Contraception - the patient is currently using  Nexplanon.  She is happy with her current form of contraception and plans to continue  4) Routine healthcare maintenance including cholesterol, diabetes screening discussed managed by PCP  5) Return in about 1 year (around 12/01/2019) for annual.   Vena AustriaAndreas Leya Paige, MD, Merlinda FrederickFACOG [facility], [facility] 12/01/2018, 2:20 PM

## 2018-12-02 LAB — HEP, RPR, HIV PANEL
HIV Screen 4th Generation wRfx: NONREACTIVE
Hepatitis B Surface Ag: NEGATIVE
RPR Ser Ql: NONREACTIVE

## 2018-12-06 LAB — CYTOLOGY - PAP
Adequacy: ABSENT
Chlamydia: NEGATIVE
Diagnosis: NEGATIVE
HPV: NOT DETECTED
Neisseria Gonorrhea: NEGATIVE

## 2018-12-14 ENCOUNTER — Ambulatory Visit: Payer: BLUE CROSS/BLUE SHIELD | Admitting: Obstetrics and Gynecology

## 2018-12-15 NOTE — Telephone Encounter (Signed)
Please write a note saying she is clear to return to work without restrictions and stamp. See pt's My chart message.

## 2019-01-12 ENCOUNTER — Other Ambulatory Visit: Payer: Self-pay

## 2019-01-12 ENCOUNTER — Emergency Department
Admission: EM | Admit: 2019-01-12 | Discharge: 2019-01-12 | Disposition: A | Discharge status: Home / Self Care | Payer: BC Managed Care – PPO | Attending: Emergency Medicine | Admitting: Emergency Medicine

## 2019-01-12 DIAGNOSIS — L02415 Cutaneous abscess of right lower limb: Secondary | ICD-10-CM | POA: Insufficient documentation

## 2019-01-12 DIAGNOSIS — I10 Essential (primary) hypertension: Secondary | ICD-10-CM | POA: Insufficient documentation

## 2019-01-12 DIAGNOSIS — E119 Type 2 diabetes mellitus without complications: Secondary | ICD-10-CM | POA: Insufficient documentation

## 2019-01-12 DIAGNOSIS — M79651 Pain in right thigh: Secondary | ICD-10-CM | POA: Diagnosis present

## 2019-01-12 DIAGNOSIS — Z79899 Other long term (current) drug therapy: Secondary | ICD-10-CM | POA: Insufficient documentation

## 2019-01-12 DIAGNOSIS — H60331 Swimmer's ear, right ear: Secondary | ICD-10-CM | POA: Insufficient documentation

## 2019-01-12 DIAGNOSIS — H9201 Otalgia, right ear: Secondary | ICD-10-CM | POA: Insufficient documentation

## 2019-01-12 DIAGNOSIS — L0291 Cutaneous abscess, unspecified: Secondary | ICD-10-CM

## 2019-01-12 MED ORDER — DOXYCYCLINE HYCLATE 100 MG PO TABS
100.0000 mg | ORAL_TABLET | Freq: Once | ORAL | Status: AC
  Administered 2019-01-12: 22:00:00 100 mg via ORAL
  Filled 2019-01-12: qty 1

## 2019-01-12 MED ORDER — DOXYCYCLINE HYCLATE 100 MG PO TBEC: 100.0000 mg | DELAYED_RELEASE_TABLET | Freq: Two times a day (BID) | ORAL | 0 refills | Status: AC

## 2019-01-12 MED ORDER — CIPRODEX 0.3-0.1 % OT SUSP: 4.0000 [drp] | Freq: Two times a day (BID) | OTIC | 0 refills | Status: AC

## 2019-01-12 NOTE — ED Triage Notes (Signed)
Patient c/o right ear pain beginning Monday. Patient c/o draining abscess to medial right thigh.

## 2019-01-12 NOTE — ED Provider Notes (Signed)
[facility] Emergency Department Provider Note  ____________________________________________  Time seen: Approximately 9:35 PM  I have reviewed the triage vital signs and the nursing notes.   HISTORY  Chief Complaint Otalgia and Abscess    HPI [name] is a 33 y.o. female presents to the emergency department with a spontaneously draining abscess along the right medial thigh and right ear pain.  Patient states that right ear pain started after patient was playing on a water slide.  She has had some drainage from the right ear.  Denies experiencing similar symptoms in the past.  Denies fever and chills.  No other alleviating measures have been attempted.        Past Medical History:  Diagnosis Date  . Diabetes mellitus without complication ([place])    before gastric sleeve procedure  . GERD (gastroesophageal reflux disease)   . Headache    when BP is up  . Hypertension   . Wears contact lenses     Patient Active Problem List   Diagnosis Date Noted  . Acute pain of right wrist 11/29/2018  . Multiple joint pain 11/29/2018  . Rotator cuff tendinitis, left 11/10/2018  . Greater trochanteric bursitis, left 11/10/2018  . Left cervical radiculopathy 01/25/2018  . Iron deficiency anemia 05/18/2017  . Menorrhagia 05/18/2017  . Sepsis ([place]) 05/21/2016  . Fatigue 03/19/2016  . Non-intractable cyclical vomiting with nausea   . Reflux esophagitis   . Abdominal pain, epigastric   . GERD (gastroesophageal reflux disease) 05/07/2015  . Bariatric surgery status 03/19/2015  . Class 2 obesity without serious comorbidity with body mass index (BMI) of 37.0 to 37.9 in adult 05/16/2013  . Type II diabetes mellitus, well controlled ([place]) 09/22/2012  . Hx of primary hypertension 09/22/2012    Past Surgical History:  Procedure Laterality Date  . CESAREAN SECTION  04/30/2009   decreased fetal heart tones due to umbilical cord wrapped  . CHOLECYSTECTOMY    .  ESOPHAGOGASTRODUODENOSCOPY (EGD) WITH PROPOFOL N/A 10/24/2015   Procedure: ESOPHAGOGASTRODUODENOSCOPY (EGD) WITH PROPOFOL;  Surgeon: Lucilla Lame, MD;  Location: [facility];  Service: Endoscopy;  Laterality: N/A;  . LAPAROSCOPIC GASTRIC SLEEVE RESECTION N/A 03/19/2015   Procedure: LAPAROSCOPIC GASTRIC SLEEVE RESECTION;  Surgeon: Ladora Daniel, MD;  Location: [facility] ORS;  Service: General;  Laterality: N/A;    Prior to Admission medications   Medication Sig Start Date End Date Taking? Authorizing Provider  ciprofloxacin-dexamethasone (CIPRODEX) OTIC suspension Place 4 drops into the right ear 2 (two) times daily for 7 days. 01/12/19 01/19/19  Lannie Fields, PA-C  diclofenac (VOLTAREN) 75 MG EC tablet Take 1 tablet (75 mg total) by mouth 2 (two) times daily. Patient not taking: Reported on 12/01/2018 11/10/18   Jinny Sanders, MD  doxycycline (DORYX) 100 MG EC tablet Take 1 tablet (100 mg total) by mouth 2 (two) times daily for 7 days. 01/12/19 01/19/19  Lannie Fields, PA-C  etonogestrel (NEXPLANON) 68 MG IMPL implant 1 each by Subdermal route once.    [provider]  ferrous sulfate 324 MG TBEC Take 324 mg by mouth daily.    [provider]  lisinopril (PRINIVIL,ZESTRIL) 20 MG tablet Take 20 mg by mouth daily.     [provider]  Multiple Vitamins-Minerals (MULTIVITAMIN WOMEN PO) Take 1 tablet by mouth daily.    [provider]    Allergies Tomato  Family History  Problem Relation Age of Onset  . Diabetes Father   . Hypertension Father   .  Hyperlipidemia Father   . Hypertension Sister   . Birth defects Sister        fluid on brain cause blindness in B eye    Social History Social History   Tobacco Use  . Smoking status: Never Smoker  . Smokeless tobacco: Never Used  Substance Use Topics  . Alcohol use: No    Comment: 1 drink/mo  . Drug use: No     Review of Systems  Constitutional: No fever/chills Eyes: No visual changes. No  discharge ENT: Patient has right ear pain.  Cardiovascular: no chest pain. Respiratory: no cough. No SOB. Gastrointestinal: No abdominal pain.  No nausea, no vomiting.  No diarrhea.  No constipation. Musculoskeletal: Negative for musculoskeletal pain. Skin: Patient has spontaneously draining abscess of right medial thigh.  Neurological: Negative for headaches, focal weakness or numbness.  ____________________________________________   PHYSICAL EXAM:  VITAL SIGNS: ED Triage Vitals  Enc Vitals Group     BP 01/12/19 1926 (!) 161/109     Pulse Rate 01/12/19 1926 82     Resp 01/12/19 1926 17     Temp 01/12/19 1926 99.2 F (37.3 C)     Temp Source 01/12/19 1926 Oral     SpO2 01/12/19 1926 100 %     Weight 01/12/19 1927 223 lb (101.2 kg)     Height 01/12/19 1927 5\' 6"  (1.676 m)     Head Circumference --      Peak Flow --      Pain Score 01/12/19 1926 8     Pain Loc --      Pain Edu? --      Excl. in GC? --      Constitutional: Alert and oriented. Well appearing and in no acute distress. Eyes: Conjunctivae are normal. PERRL. EOMI. Head: Atraumatic. ENT:      Ears: Patient has pain with palpation of right tragus. Patient has purulent exudate in right external auditory canal. Right TM is pearly.       Nose: No congestion/rhinnorhea.      Mouth/Throat: Mucous membranes are moist.  Neck: No stridor.  No cervical spine tenderness to palpation.  Cardiovascular: Normal rate, regular rhythm. Normal S1 and S2.  Good peripheral circulation. Respiratory: Normal respiratory effort without tachypnea or retractions. Lungs CTAB. Good air entry to the bases with no decreased or absent breath sounds. Musculoskeletal: Full range of motion to all extremities. No gross deformities appreciated. Neurologic:  Normal speech and language. No gross focal neurologic deficits are appreciated.  Skin: Patient has spontaneously draining abscess the right medial thigh. Psychiatric: Mood and affect are  normal. Speech and behavior are normal. Patient exhibits appropriate insight and judgement.   ____________________________________________   LABS (all labs ordered are listed, but only abnormal results are displayed)  Labs Reviewed - No data to display ____________________________________________  EKG   ____________________________________________  RADIOLOGY   No results found.  ____________________________________________    PROCEDURES  Procedure(s) performed:    Procedures    Medications - No data to display   ____________________________________________   INITIAL IMPRESSION / ASSESSMENT AND PLAN / ED COURSE  Pertinent labs & imaging results that were available during my care of the patient were reviewed by me and considered in my medical decision making (see chart for details).  Review of the [place] CSRS was performed in accordance of the [facility] prior to dispensing any controlled drugs.           Assessment and plan Otitis externa Abscess 33 year old female  presents to the emergency department with persistent right ear pain and a spontaneously draining abscess of the right medial thigh.  Patient was hypertensive at triage but vital signs were otherwise stable.  On physical exam, patient had a spontaneously draining abscess on the right medial thigh without extensive surrounding cellulitis.  She also had reproducible tenderness with palpation of the right tragus and purulent exudate in the right external auditory canal consistent with otitis externa.  Patient was discharged with doxycycline and Ciprodex.  She was advised to follow-up with primary care as needed.  All patient questions were answered.   ____________________________________________  FINAL CLINICAL IMPRESSION(S) / ED DIAGNOSES  Final diagnoses:  Abscess  Acute swimmer's ear of right side      NEW MEDICATIONS STARTED DURING THIS VISIT:  ED Discharge Orders         Ordered     ciprofloxacin-dexamethasone (CIPRODEX) OTIC suspension  2 times daily     01/12/19 2133    doxycycline (DORYX) 100 MG EC tablet  2 times daily     01/12/19 2133              This chart was dictated using voice recognition software/Dragon. Despite best efforts to proofread, errors can occur which can change the meaning. Any change was purely unintentional.    Orvil FeilWoods, Rhett Mutschler M, PA-C 01/12/19 2140    Concha SeFunke, Mary E, MD 01/13/19 [phone]

## 2019-03-30 ENCOUNTER — Encounter: Payer: Self-pay | Admitting: Family Medicine

## 2019-03-30 ENCOUNTER — Telehealth: Payer: Self-pay | Admitting: Family Medicine

## 2019-03-30 ENCOUNTER — Ambulatory Visit (INDEPENDENT_AMBULATORY_CARE_PROVIDER_SITE_OTHER): Payer: BC Managed Care – PPO | Admitting: Family Medicine

## 2019-03-30 ENCOUNTER — Other Ambulatory Visit: Payer: Self-pay

## 2019-03-30 VITALS — BP 141/95 | HR 64 | Temp 97.9°F | Ht 67.0 in | Wt 230.8 lb

## 2019-03-30 DIAGNOSIS — M654 Radial styloid tenosynovitis [de Quervain]: Secondary | ICD-10-CM | POA: Insufficient documentation

## 2019-03-30 DIAGNOSIS — M7551 Bursitis of right shoulder: Secondary | ICD-10-CM | POA: Diagnosis not present

## 2019-03-30 MED ORDER — PREDNISONE 10 MG PO TABS: ORAL_TABLET | ORAL | 0 refills | Status: DC

## 2019-03-30 NOTE — Assessment & Plan Note (Signed)
Treat with prednsione taper, home PT, ice. If not improving.. follow up with ORTHO.

## 2019-03-30 NOTE — Telephone Encounter (Signed)
Left message asking pt to call office I received accommodation paperwork Is it for the reason she saw dr Diona Browner on 10/15.  If not the reason

## 2019-03-30 NOTE — Assessment & Plan Note (Signed)
Continue with brace. Limit repetitive wrist motion for 2 weeks.  Treat with steroid taper, home PT.  If not improving follow up with [facility]

## 2019-03-30 NOTE — Progress Notes (Signed)
Chief Complaint  Patient presents with  . Shoulder Pain    Right  . Arm Pain    Right    History of Present Illness: HPI   33 year old female presents for continued  pain in right arm. 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  Onoging for several months... now pain more in right wrist and moved to right upper arm, not  In neck. No change with head movement. Pain with right arm abduction.  Right thumb painful.. tender with movement and writing. She is right handed.  No numbness Has some grip and strength decrease in hand in right.  IMPRESSION: There is slight disc space narrowing at C4-5 with tiny dorsal osteophytes noted. Findings are likely degenerative in etiology. No acute osseous abnormality.  She has been seeing ORTHO Dr. Prescott Parma for cervical radiculopathy.  Treated with prednisone 10 mg x 10 days.  Wearing brace on right wrist x 1 week.  Pain went away in July.. went back to work.  Now in August pain has returned.  She restarted wear brace thumb spica, tylenol and ibuprofen has not helped.   Left arm and shoulder pain has improved.    COVID 19 screen No recent travel or known exposure to COVID19 The patient denies respiratory symptoms of COVID 19 at this time.  The importance of social distancing was discussed today.   Review of Systems  Constitutional: Negative for chills and fever.  HENT: Negative for congestion and ear pain.   Eyes: Negative for pain and redness.  Respiratory: Negative for cough and shortness of breath.   Cardiovascular: Negative for chest pain, palpitations and leg swelling.  Gastrointestinal: Negative for abdominal pain, blood in stool, constipation, diarrhea, nausea and vomiting.  Genitourinary: Negative for dysuria.  Musculoskeletal: Positive for joint pain. Negative for falls and myalgias.  Skin: Negative for rash.  Neurological: Negative for dizziness.  Psychiatric/Behavioral: Negative for depression. The patient is not  nervous/anxious.       Past Medical History:  Diagnosis Date  . Diabetes mellitus without complication ([place])    before gastric sleeve procedure  . GERD (gastroesophageal reflux disease)   . Headache    when BP is up  . Hypertension   . Wears contact lenses     reports that she has never smoked. She has never used smokeless tobacco. She reports that she does not drink alcohol or use drugs.   Current Outpatient Medications:  .  etonogestrel (NEXPLANON) 68 MG IMPL implant, 1 each by Subdermal route once., Disp: , Rfl:  .  ferrous sulfate 324 MG TBEC, Take 324 mg by mouth daily., Disp: , Rfl:  .  lisinopril (PRINIVIL,ZESTRIL) 20 MG tablet, Take 20 mg by mouth daily. , Disp: , Rfl:  .  Multiple Vitamins-Minerals (MULTIVITAMIN WOMEN PO), Take 1 tablet by mouth daily., Disp: , Rfl:  .  diclofenac (VOLTAREN) 75 MG EC tablet, Take 1 tablet (75 mg total) by mouth 2 (two) times daily. (Patient not taking: Reported on 12/01/2018), Disp: 30 tablet, Rfl: 0   Observations/Objective: Blood pressure (!) 141/95, pulse 64, temperature 97.9 F (36.6 C), temperature source Temporal, height 5\' 7"  (1.702 m), weight 230 lb 12 oz (104.7 kg).  Physical Exam Constitutional:      General: She is not in acute distress.    Appearance: Normal appearance. She is well-developed. She is not ill-appearing or toxic-appearing.  HENT:     Head: Normocephalic.     Right Ear: Hearing, tympanic membrane, ear canal and  external ear normal. Tympanic membrane is not erythematous, retracted or bulging.     Left Ear: Hearing, tympanic membrane, ear canal and external ear normal. Tympanic membrane is not erythematous, retracted or bulging.     Nose: No mucosal edema or rhinorrhea.     Right Sinus: No maxillary sinus tenderness or frontal sinus tenderness.     Left Sinus: No maxillary sinus tenderness or frontal sinus tenderness.     Mouth/Throat:     Pharynx: Uvula midline.  Eyes:     General: Lids are normal. Lids are  everted, no foreign bodies appreciated.     Conjunctiva/sclera: Conjunctivae normal.     Pupils: Pupils are equal, round, and reactive to light.  Neck:     Musculoskeletal: Normal range of motion and neck supple.     Thyroid: No thyroid mass or thyromegaly.     Vascular: No carotid bruit.     Trachea: Trachea normal.  Cardiovascular:     Rate and Rhythm: Normal rate and regular rhythm.     Pulses: Normal pulses.     Heart sounds: Normal heart sounds, S1 normal and S2 normal. No murmur. No friction rub. No gallop.   Pulmonary:     Effort: Pulmonary effort is normal. No tachypnea or respiratory distress.     Breath sounds: Normal breath sounds. No decreased breath sounds, wheezing, rhonchi or rales.  Abdominal:     General: Bowel sounds are normal.     Palpations: Abdomen is soft.     Tenderness: There is no abdominal tenderness.  Musculoskeletal:     Right shoulder: She exhibits decreased range of motion and tenderness. She exhibits no bony tenderness.     Right wrist: She exhibits decreased range of motion, tenderness and bony tenderness. She exhibits no swelling, no effusion and no deformity.     Comments: Lateral ttp over deltoid to palpation. Pain with abduction,  Neg Neer's, neg drop arm test.  ttp at base of thumb, positive finkelstein test  On right  Skin:    General: Skin is warm and dry.     Findings: No rash.  Neurological:     Mental Status: She is alert.  Psychiatric:        Mood and Affect: Mood is not anxious or depressed.        Speech: Speech normal.        Behavior: Behavior normal. Behavior is cooperative.        Thought Content: Thought content normal.        Judgment: Judgment normal.      Assessment and Plan  Subacromial bursitis of right shoulder joint Treat with prednsione taper, home PT, ice. If not improving.. follow up with ORTHO.  De Quervain's tenosynovitis, right Continue with brace. Limit repetitive wrist motion for 2 weeks.  Treat with  steroid taper, home PT.  If not improving follow up with Deitra Mayo, MD

## 2019-03-30 NOTE — Patient Instructions (Addendum)
We will complete FMLA paperwork.. leave of absence 03/19/2019- 04/13/2019.  Repeat course of prednisone course but for 15 days, start at higher dose  Wear arm brace. Start home physical therapy.  Call and follow up with Dr. Prescott Parma if not improving for possible steroid injections.

## 2019-03-31 DIAGNOSIS — Z0279 Encounter for issue of other medical certificate: Secondary | ICD-10-CM

## 2019-04-03 ENCOUNTER — Ambulatory Visit (INDEPENDENT_AMBULATORY_CARE_PROVIDER_SITE_OTHER): Payer: BC Managed Care – PPO | Admitting: Obstetrics and Gynecology

## 2019-04-03 ENCOUNTER — Encounter: Payer: Self-pay | Admitting: Obstetrics and Gynecology

## 2019-04-03 ENCOUNTER — Other Ambulatory Visit: Payer: Self-pay

## 2019-04-03 VITALS — BP 130/80 | Ht 66.0 in | Wt 230.0 lb

## 2019-04-03 DIAGNOSIS — Z803 Family history of malignant neoplasm of breast: Secondary | ICD-10-CM | POA: Diagnosis not present

## 2019-04-03 DIAGNOSIS — N649 Disorder of breast, unspecified: Secondary | ICD-10-CM | POA: Diagnosis not present

## 2019-04-03 MED ORDER — DOXYCYCLINE HYCLATE 100 MG PO CAPS: 100.0000 mg | ORAL_CAPSULE | Freq: Two times a day (BID) | ORAL | 0 refills | Status: AC

## 2019-04-03 NOTE — Progress Notes (Signed)
Cassidy Seltzer, MD   Chief Complaint  Patient presents with  . Breast Exam    lump on left breast, noticed it last Wed, hurts only when pt touches    HPI:      [name] is a 33 y.o. G2P1011 who LMP was No LMP recorded. Patient has had an implant., presents today for LT breast mass/lesion for past 6 days. Area is tender, has not drained. Has redness, denies any trauma or bites. No nipple d/c. Has had chills, no fevers. Has FH breast cancer in 2 mat aunts, ages unknown. No recent mammo.  Currently has nexplanon with occas bleeding.  Last annual 6/20.   Patient Active Problem List   Diagnosis Date Noted  . Family history of breast cancer 04/03/2019  . De Quervain's tenosynovitis, right 03/30/2019  . Subacromial bursitis of right shoulder joint 03/30/2019  . Acute pain of right wrist 11/29/2018  . Multiple joint pain 11/29/2018  . Rotator cuff tendinitis, left 11/10/2018  . Greater trochanteric bursitis, left 11/10/2018  . Left cervical radiculopathy 01/25/2018  . Iron deficiency anemia 05/18/2017  . Menorrhagia 05/18/2017  . Sepsis ([facility]) 05/21/2016  . Fatigue 03/19/2016  . Non-intractable cyclical vomiting with nausea   . Reflux esophagitis   . Abdominal pain, epigastric   . GERD (gastroesophageal reflux disease) 05/07/2015  . Bariatric surgery status 03/19/2015  . Class 2 obesity without serious comorbidity with body mass index (BMI) of 37.0 to 37.9 in adult 05/16/2013  . Type II diabetes mellitus, well controlled ([facility]) 09/22/2012  . Hx of primary hypertension 09/22/2012    Past Surgical History:  Procedure Laterality Date  . CESAREAN SECTION  04/30/2009   decreased fetal heart tones due to umbilical cord wrapped  . CHOLECYSTECTOMY    . ESOPHAGOGASTRODUODENOSCOPY (EGD) WITH PROPOFOL N/A 10/24/2015   Procedure: ESOPHAGOGASTRODUODENOSCOPY (EGD) WITH PROPOFOL;  Surgeon: Midge Minium, MD;  Location[facility];  Service: Endoscopy;  Laterality: N/A;  .  LAPAROSCOPIC GASTRIC SLEEVE RESECTION N/A 03/19/2015   Procedure: LAPAROSCOPIC GASTRIC SLEEVE RESECTION;  Surgeon: Everette Rank, MD;  Location: [facility];  Service: General;  Laterality: N/A;    Family History  Problem Relation Age of Onset  . Diabetes Father   . Hypertension Father   . Hyperlipidemia Father   . Hypertension Sister   . Birth defects Sister        fluid on brain cause blindness in B eye  . Breast cancer Maternal Aunt   . Breast cancer Maternal Aunt     Social History   Socioeconomic History  . Marital status: Single    Spouse name: Not on file  . Number of children: Not on file  . Years of education: Not on file  . Highest education level: Not on file  Occupational History  . Not on file  Social Needs  . Financial resource strain: Not on file  . Food insecurity    Worry: Not on file    Inability: Not on file  . Transportation needs    Medical: Not on file    Non-medical: Not on file  Tobacco Use  . Smoking status: Never Smoker  . Smokeless tobacco: Never Used  Substance and Sexual Activity  . Alcohol use: No    Comment: 1 drink/mo  . Drug use: No  . Sexual activity: Yes    Partners: Male    Birth control/protection: Implant    Comment: Nutraplanada  Lifestyle  . Physical activity  Days per week: 3 days    Minutes per session: 60 min  . Stress: Only a little  Relationships  . Social Herbalist on phone: Not on file    Gets together: Not on file    Attends religious service: Not on file    Active member of club or organization: Not on file    Attends meetings of clubs or organizations: Not on file    Relationship status: Not on file  . Intimate partner violence    Fear of current or ex partner: Not on file    Emotionally abused: Not on file    Physically abused: Not on file    Forced sexual activity: Not on file  Other Topics Concern  . Not on file  Social History Narrative   Single, on child age 68.    Outpatient  Medications Prior to Visit  Medication Sig Dispense Refill  . etonogestrel (NEXPLANON) 68 MG IMPL implant 1 each by Subdermal route once.    . ferrous sulfate 324 MG TBEC Take 324 mg by mouth daily.    Marland Kitchen lisinopril (PRINIVIL,ZESTRIL) 20 MG tablet Take 20 mg by mouth daily.     . Multiple Vitamins-Minerals (MULTIVITAMIN WOMEN PO) Take 1 tablet by mouth daily.    . predniSONE (DELTASONE) 10 MG tablet 3 tabs daily x 5 days , then 2 tabs daily x 5 days then 1 tab daily x 5 days. 30 tablet 0   No facility-administered medications prior to visit.     ROS:  Review of Systems  Constitutional: Positive for chills. Negative for fever.  Gastrointestinal: Negative for blood in stool, constipation, diarrhea, nausea and vomiting.  Genitourinary: Negative for dyspareunia, dysuria, flank pain, frequency, hematuria, urgency, vaginal bleeding, vaginal discharge and vaginal pain.  Musculoskeletal: Negative for back pain.  Skin: Negative for rash.   BREAST: mass/tenderness   OBJECTIVE:   Vitals:  BP 130/80   Ht 5\' 6"  (1.676 m)   Wt 230 lb (104.3 kg)   BMI 37.12 kg/m   Physical Exam Vitals signs reviewed.  Neck:     Musculoskeletal: Normal range of motion.  Pulmonary:     Effort: Pulmonary effort is normal.  Chest:     Breasts: Breasts are symmetrical.        Right: No inverted nipple, mass, nipple discharge, skin change or tenderness.        Left: Mass, skin change and tenderness present. No inverted nipple or nipple discharge.    Musculoskeletal: Normal range of motion.  Skin:    General: Skin is warm and dry.  Neurological:     General: No focal deficit present.     Mental Status: She is alert and oriented to person, place, and time.     Cranial Nerves: No cranial nerve deficit.  Psychiatric:        Mood and Affect: Mood normal.        Behavior: Behavior normal.        Thought Content: Thought content normal.        Judgment: Judgment normal.     Assessment/Plan: Breast  lesion - Plan: doxycycline (VIBRAMYCIN) 100 MG capsule; LT breast. Most likely abscess. Rx doxy/warm compresses. RTO in 2 wks for sx f/u/sooner prn. If sx persist, will check breast u/s.   Family history of breast cancer--Pt to clarify FH to f/u in 2 wks. May qualify for cancer genetic testing.    Meds ordered this encounter  Medications  . doxycycline (  VIBRAMYCIN) 100 MG capsule    Sig: Take 1 capsule (100 mg total) by mouth 2 (two) times daily for 10 days.    Dispense:  20 capsule    Refill:  0    Order Specific Question:   Supervising Provider    Answer:   Nadara MustardHARRIS, ROBERT P [[id number]]      Return in about 2 weeks (around 04/17/2019) for breast mass f/u.   B. , PA-C 04/03/2019 3:45 PM

## 2019-04-03 NOTE — Patient Instructions (Signed)
I value your feedback and entrusting us with your care. If you get a [place] patient survey, I would appreciate you taking the time to let us know about your experience today. Thank you! 

## 2019-04-05 NOTE — Telephone Encounter (Signed)
Paperwork faxed 10/19 Copy for scan

## 2019-04-06 ENCOUNTER — Telehealth: Payer: Self-pay | Admitting: Family Medicine

## 2019-04-06 NOTE — Telephone Encounter (Signed)
Jonelia @ [facility] called wanting to clarify  On   1) accommodation medical questionnaire She wanted to know if the restrictions for grasping,pulling,reaching,lifting is interment or continuous for 03/20/2019 to 04/12/2019.  She stated any changed you can write at bottom of page or can call to verbal   2) on intermittent leave Question 2b  B) duration of episode you put 30 days.  Jonelia took this as meaning if pt has episode she will be out for 30days?  I marked where she was having questions

## 2019-04-07 NOTE — Telephone Encounter (Signed)
Paperwork refaxed.

## 2019-04-07 NOTE — Telephone Encounter (Signed)
Completed and in outbox. 

## 2019-04-10 NOTE — Telephone Encounter (Signed)
Paperwork faxed °Copy for scan °

## 2019-04-13 ENCOUNTER — Telehealth: Payer: Self-pay | Admitting: Family Medicine

## 2019-04-13 DIAGNOSIS — Z0279 Encounter for issue of other medical certificate: Secondary | ICD-10-CM

## 2019-04-13 NOTE — Telephone Encounter (Signed)
[facility] faxed additional paperwork to be filled out  In dr bedsole's in box

## 2019-04-14 NOTE — Telephone Encounter (Signed)
Paperwork faxed Copy for scan Copy for file Copy for billing  Pt aware

## 2019-04-17 ENCOUNTER — Other Ambulatory Visit: Payer: Self-pay

## 2019-04-17 ENCOUNTER — Encounter: Payer: Self-pay | Admitting: Obstetrics and Gynecology

## 2019-04-17 ENCOUNTER — Ambulatory Visit (INDEPENDENT_AMBULATORY_CARE_PROVIDER_SITE_OTHER): Payer: BC Managed Care – PPO | Admitting: Obstetrics and Gynecology

## 2019-04-17 VITALS — BP 120/80 | Ht 66.0 in | Wt 232.0 lb

## 2019-04-17 DIAGNOSIS — N649 Disorder of breast, unspecified: Secondary | ICD-10-CM | POA: Diagnosis not present

## 2019-04-17 NOTE — Patient Instructions (Signed)
I value your feedback and entrusting us with your care. If you get a [place] patient survey, I would appreciate you taking the time to let us know about your experience today. Thank you! 

## 2019-04-17 NOTE — Progress Notes (Signed)
Excell Seltzer, MD   Chief Complaint  Patient presents with  . Follow-up    still as same time    HPI:      [name] is a 33 y.o. G2P1011 who LMP was No LMP recorded. Patient has had an implant., presents today for f/u LT breast abscess/lesion from 04/03/19 appt; sx started 03/29/19. Started on doxy/doing warm compresses for what looked like breast abscess. Area is smaller but still tender. Never had d/c.  No prior mammo.  FH BREAST CANCER in 2 mat grt aunts and 1 mat cousin, all in early 5s. Genetic testing not done as far as pt knows but cousin recently diagnosed and doing tx currently.   Currently has nexplanon with occas bleeding.  Last annual 6/20.   Patient Active Problem List   Diagnosis Date Noted  . Family history of breast cancer 04/03/2019  . De Quervain's tenosynovitis, right 03/30/2019  . Subacromial bursitis of right shoulder joint 03/30/2019  . Acute pain of right wrist 11/29/2018  . Multiple joint pain 11/29/2018  . Rotator cuff tendinitis, left 11/10/2018  . Greater trochanteric bursitis, left 11/10/2018  . Left cervical radiculopathy 01/25/2018  . Iron deficiency anemia 05/18/2017  . Menorrhagia 05/18/2017  . Sepsis (HCC) 05/21/2016  . Fatigue 03/19/2016  . Non-intractable cyclical vomiting with nausea   . Reflux esophagitis   . Abdominal pain, epigastric   . GERD (gastroesophageal reflux disease) 05/07/2015  . Bariatric surgery status 03/19/2015  . Class 2 obesity without serious comorbidity with body mass index (BMI) of 37.0 to 37.9 in adult 05/16/2013  . Type II diabetes mellitus, well controlled (HCC) 09/22/2012  . Hx of primary hypertension 09/22/2012    Past Surgical History:  Procedure Laterality Date  . CESAREAN SECTION  04/30/2009   decreased fetal heart tones due to umbilical cord wrapped  . CHOLECYSTECTOMY    . ESOPHAGOGASTRODUODENOSCOPY (EGD) WITH PROPOFOL N/A 10/24/2015   Procedure: ESOPHAGOGASTRODUODENOSCOPY (EGD) WITH  PROPOFOL;  Surgeon: Midge Minium, MD;  Location: [facility];  Service: Endoscopy;  Laterality: N/A;  . LAPAROSCOPIC GASTRIC SLEEVE RESECTION N/A 03/19/2015   Procedure: LAPAROSCOPIC GASTRIC SLEEVE RESECTION;  Surgeon: Everette Rank, MD;  Location: [facility];  Service: General;  Laterality: N/A;    Family History  Problem Relation Age of Onset  . Diabetes Father   . Hypertension Father   . Hyperlipidemia Father   . Hypertension Sister   . Birth defects Sister        fluid on brain cause blindness in B eye  . Breast cancer Maternal Aunt 30       has contact  . Breast cancer Maternal Aunt 30       has contact  . Breast cancer Cousin 30       has contact    Social History   Socioeconomic History  . Marital status: Single    Spouse name: Not on file  . Number of children: Not on file  . Years of education: Not on file  . Highest education level: Not on file  Occupational History  . Not on file  Social Needs  . Financial resource strain: Not on file  . Food insecurity    Worry: Not on file    Inability: Not on file  . Transportation needs    Medical: Not on file    Non-medical: Not on file  Tobacco Use  . Smoking status: Never Smoker  . Smokeless tobacco: Never Used  Substance and Sexual Activity  . Alcohol use: No    Comment: 1 drink/mo  . Drug use: No  . Sexual activity: Yes    Partners: Male    Birth control/protection: Implant    Comment: Nutraplanada  Lifestyle  . Physical activity    Days per week: 3 days    Minutes per session: 60 min  . Stress: Only a little  Relationships  . Social connections    TaMusicianlks on phone: Not on file    Gets together: Not on file    Attends religious service: Not on file    Active member of club or organization: Not on file    Attends meetings of clubs or organizations: Not on file    Relationship status: Not on file  . Intimate partner violence    Fear of current or ex partner: Not on file    Emotionally abused: Not  on file    Physically abused: Not on file    Forced sexual activity: Not on file  Other Topics Concern  . Not on file  Social History Narrative   Single, on child age 89.    Outpatient Medications Prior to Visit  Medication Sig Dispense Refill  . etonogestrel (NEXPLANON) 68 MG IMPL implant 1 each by Subdermal route once.    . ferrous sulfate 324 MG TBEC Take 324 mg by mouth daily.    Marland Kitchen. lisinopril (PRINIVIL,ZESTRIL) 20 MG tablet Take 20 mg by mouth daily.     . Multiple Vitamins-Minerals (MULTIVITAMIN WOMEN PO) Take 1 tablet by mouth daily.    . predniSONE (DELTASONE) 10 MG tablet 3 tabs daily x 5 days , then 2 tabs daily x 5 days then 1 tab daily x 5 days. 30 tablet 0   No facility-administered medications prior to visit.     ROS:  Review of Systems  Constitutional: Negative for chills and fever.  Gastrointestinal: Negative for blood in stool, constipation, diarrhea, nausea and vomiting.  Genitourinary: Negative for dyspareunia, dysuria, flank pain, frequency, hematuria, urgency, vaginal bleeding, vaginal discharge and vaginal pain.  Musculoskeletal: Negative for back pain.  Skin: Negative for rash.   BREAST: mass/tenderness   OBJECTIVE:   Vitals:  BP 120/80   Ht 5\' 6"  (1.676 m)   Wt 232 lb (105.2 kg)   BMI 37.45 kg/m   Physical Exam Vitals signs reviewed.  Neck:     Musculoskeletal: Normal range of motion.  Pulmonary:     Effort: Pulmonary effort is normal.  Chest:     Breasts: Breasts are symmetrical.        Right: No inverted nipple, mass, nipple discharge, skin change or tenderness.        Left: Mass, skin change and tenderness present. No inverted nipple or nipple discharge.    Musculoskeletal: Normal range of motion.  Skin:    General: Skin is warm and dry.  Neurological:     General: No focal deficit present.     Mental Status: She is alert and oriented to person, place, and time.     Cranial Nerves: No cranial nerve deficit.  Psychiatric:         Mood and Affect: Mood normal.        Behavior: Behavior normal.        Thought Content: Thought content normal.        Judgment: Judgment normal.     Assessment/Plan: Breast lesion - Plan: doxycycline (VIBRAMYCIN) 100 MG capsule; LT breast. Sx improving. Discussed following along  vs imaging. Pt would prefer mammo/u/s. Will call with results.   Family history of breast cancer--Qualifies for cancer genetic testing. Discussed checking with cousin regarding testing since undergoing current tx. If not, can test pt. MyRisk handout given. F/u prn    Return if symptoms worsen or fail to improve.  Marinell Igarashi B. Aydia Maj, PA-C 04/17/2019 3:46 PM

## 2019-04-25 ENCOUNTER — Telehealth: Payer: Self-pay | Admitting: Obstetrics and Gynecology

## 2019-04-25 NOTE — Telephone Encounter (Signed)
Called and spoke with patient, she is aware of appointment time and date at [facility] on Friday Apr 28, 2019@ 2:00PM

## 2019-04-25 NOTE — Addendum Note (Signed)
Addended by: Ardeth Perfect B on: [id number]/41/[id number] 09:27 AM   Modules accepted: Orders

## 2019-04-28 ENCOUNTER — Other Ambulatory Visit: Payer: BC Managed Care – PPO

## 2019-05-02 DIAGNOSIS — R05 Cough: Secondary | ICD-10-CM | POA: Diagnosis not present

## 2019-05-02 DIAGNOSIS — R519 Headache, unspecified: Secondary | ICD-10-CM | POA: Diagnosis not present

## 2019-05-02 DIAGNOSIS — Z20828 Contact with and (suspected) exposure to other viral communicable diseases: Secondary | ICD-10-CM | POA: Diagnosis not present

## 2019-05-02 DIAGNOSIS — Z7189 Other specified counseling: Secondary | ICD-10-CM | POA: Diagnosis not present

## 2019-05-05 DIAGNOSIS — M7542 Impingement syndrome of left shoulder: Secondary | ICD-10-CM | POA: Diagnosis not present

## 2019-05-05 DIAGNOSIS — M5412 Radiculopathy, cervical region: Secondary | ICD-10-CM | POA: Diagnosis not present

## 2019-05-05 DIAGNOSIS — M25512 Pain in left shoulder: Secondary | ICD-10-CM | POA: Diagnosis not present

## 2019-05-15 ENCOUNTER — Ambulatory Visit
Admission: RE | Admit: 2019-05-15 | Discharge: 2019-05-15 | Disposition: A | Discharge status: Home / Self Care | Payer: BC Managed Care – PPO | Source: Ambulatory Visit | Attending: Obstetrics and Gynecology | Admitting: Obstetrics and Gynecology

## 2019-05-15 ENCOUNTER — Encounter: Payer: Self-pay | Admitting: Obstetrics and Gynecology

## 2019-05-15 DIAGNOSIS — N649 Disorder of breast, unspecified: Secondary | ICD-10-CM | POA: Diagnosis not present

## 2019-05-15 DIAGNOSIS — N644 Mastodynia: Secondary | ICD-10-CM | POA: Diagnosis not present

## 2019-05-15 DIAGNOSIS — N6489 Other specified disorders of breast: Secondary | ICD-10-CM | POA: Diagnosis not present

## 2019-05-15 DIAGNOSIS — R928 Other abnormal and inconclusive findings on diagnostic imaging of breast: Secondary | ICD-10-CM | POA: Diagnosis not present

## 2019-05-19 ENCOUNTER — Other Ambulatory Visit: Payer: Self-pay

## 2019-05-19 DIAGNOSIS — Z20822 Contact with and (suspected) exposure to covid-19: Secondary | ICD-10-CM

## 2019-05-21 LAB — NOVEL CORONAVIRUS, NAA: SARS-CoV-2, NAA: NOT DETECTED

## 2019-06-01 ENCOUNTER — Ambulatory Visit: Payer: BC Managed Care – PPO | Attending: Internal Medicine

## 2019-06-01 ENCOUNTER — Other Ambulatory Visit: Payer: Self-pay

## 2019-06-01 DIAGNOSIS — Z20822 Contact with and (suspected) exposure to covid-19: Secondary | ICD-10-CM

## 2019-06-01 DIAGNOSIS — Z20828 Contact with and (suspected) exposure to other viral communicable diseases: Secondary | ICD-10-CM | POA: Diagnosis not present

## 2019-06-02 LAB — NOVEL CORONAVIRUS, NAA: SARS-CoV-2, NAA: NOT DETECTED

## 2019-06-22 ENCOUNTER — Other Ambulatory Visit: Payer: Self-pay | Admitting: Obstetrics and Gynecology

## 2019-06-22 MED ORDER — NITROFURANTOIN MONOHYD MACRO 100 MG PO CAPS: 100.0000 mg | ORAL_CAPSULE | Freq: Two times a day (BID) | ORAL | 1 refills | Status: DC

## 2019-06-23 ENCOUNTER — Ambulatory Visit: Payer: BC Managed Care – PPO | Attending: Internal Medicine

## 2019-06-23 DIAGNOSIS — Z20822 Contact with and (suspected) exposure to covid-19: Secondary | ICD-10-CM

## 2019-06-25 LAB — NOVEL CORONAVIRUS, NAA: SARS-CoV-2, NAA: NOT DETECTED

## 2019-07-10 ENCOUNTER — Other Ambulatory Visit: Payer: BC Managed Care – PPO

## 2019-07-20 ENCOUNTER — Ambulatory Visit: Payer: BC Managed Care – PPO | Attending: Internal Medicine

## 2019-07-20 DIAGNOSIS — Z20822 Contact with and (suspected) exposure to covid-19: Secondary | ICD-10-CM

## 2019-07-21 LAB — SPECIMEN STATUS REPORT

## 2019-07-21 LAB — NOVEL CORONAVIRUS, NAA: SARS-CoV-2, NAA: NOT DETECTED

## 2019-08-18 LAB — HM DIABETES EYE EXAM

## 2019-08-21 ENCOUNTER — Ambulatory Visit: Payer: BC Managed Care – PPO | Attending: Internal Medicine

## 2019-08-21 DIAGNOSIS — Z20822 Contact with and (suspected) exposure to covid-19: Secondary | ICD-10-CM | POA: Diagnosis not present

## 2019-08-22 LAB — NOVEL CORONAVIRUS, NAA: SARS-CoV-2, NAA: NOT DETECTED

## 2019-09-18 ENCOUNTER — Other Ambulatory Visit: Payer: Self-pay

## 2019-09-18 ENCOUNTER — Ambulatory Visit: Payer: BC Managed Care – PPO | Attending: Internal Medicine

## 2019-09-18 DIAGNOSIS — Z23 Encounter for immunization: Secondary | ICD-10-CM

## 2019-09-18 NOTE — Progress Notes (Signed)
   Covid-19 Vaccination Clinic  Name:  [name]    MRN: [id number] DOB: [birth year]  09/18/2019  [name] was observed post Covid-19 immunization for 15 minutes without incident. She was provided with Vaccine Information Sheet and instruction to access the V-Safe system.   [name] was instructed to call 911 with any severe reactions post vaccine: Marland Kitchen Difficulty breathing  . Swelling of face and throat  . A fast heartbeat  . A bad rash all over body  . Dizziness and weakness   Immunizations Administered    Name Date Dose VIS Date Route   Pfizer COVID-19 Vaccine 09/18/2019  1:44 PM 0.3 mL 05/26/2019 Intramuscular   Manufacturer: [employer], [employer]   Lot: [id number]   NDC: [address]-8

## 2019-10-11 ENCOUNTER — Ambulatory Visit: Payer: Self-pay

## 2019-10-24 ENCOUNTER — Ambulatory Visit: Payer: Self-pay

## 2019-10-27 ENCOUNTER — Ambulatory Visit: Payer: BC Managed Care – PPO | Attending: Internal Medicine

## 2019-10-27 DIAGNOSIS — Z23 Encounter for immunization: Secondary | ICD-10-CM

## 2019-10-27 NOTE — Progress Notes (Signed)
   Covid-19 Vaccination Clinic  Name:  [name]    MRN: [id number] DOB: Nov 24, 1985  10/27/2019  [name] was observed post Covid-19 immunization for 15 minutes without incident. She was provided with Vaccine Information Sheet and instruction to access the V-Safe system.   [name] was instructed to call 911 with any severe reactions post vaccine: Marland Kitchen Difficulty breathing  . Swelling of face and throat  . A fast heartbeat  . A bad rash all over body  . Dizziness and weakness   Immunizations Administered    Name Date Dose VIS Date Route   Pfizer COVID-19 Vaccine 10/27/2019  8:44 AM 0.3 mL 08/09/2018 Intramuscular   Manufacturer: [employer], [employer]   Lot: [id number]   NDC: [address]-8

## 2019-12-01 ENCOUNTER — Telehealth: Payer: Self-pay

## 2019-12-01 ENCOUNTER — Other Ambulatory Visit: Payer: Self-pay | Admitting: Family Medicine

## 2019-12-01 ENCOUNTER — Other Ambulatory Visit: Payer: Self-pay

## 2019-12-01 ENCOUNTER — Encounter: Payer: Self-pay | Admitting: Family Medicine

## 2019-12-01 ENCOUNTER — Ambulatory Visit
Admission: RE | Admit: 2019-12-01 | Discharge: 2019-12-01 | Disposition: A | Discharge status: Home / Self Care | Payer: BC Managed Care – PPO | Source: Ambulatory Visit | Attending: Family Medicine | Admitting: Family Medicine

## 2019-12-01 ENCOUNTER — Telehealth: Payer: Self-pay | Admitting: Family Medicine

## 2019-12-01 ENCOUNTER — Ambulatory Visit (INDEPENDENT_AMBULATORY_CARE_PROVIDER_SITE_OTHER): Payer: BC Managed Care – PPO | Admitting: Family Medicine

## 2019-12-01 VITALS — BP 130/90 | HR 72 | Temp 97.3°F | Ht 67.0 in | Wt 226.0 lb

## 2019-12-01 DIAGNOSIS — R1011 Right upper quadrant pain: Secondary | ICD-10-CM | POA: Insufficient documentation

## 2019-12-01 DIAGNOSIS — R109 Unspecified abdominal pain: Secondary | ICD-10-CM | POA: Diagnosis not present

## 2019-12-01 DIAGNOSIS — R1013 Epigastric pain: Secondary | ICD-10-CM | POA: Diagnosis not present

## 2019-12-01 LAB — POC URINALSYSI DIPSTICK (AUTOMATED)
Bilirubin, UA: NEGATIVE
Glucose, UA: NEGATIVE
Ketones, UA: NEGATIVE
Leukocytes, UA: NEGATIVE
Nitrite, UA: NEGATIVE
Protein, UA: POSITIVE — AB
Spec Grav, UA: >=1.03 — AB (ref 1.010–1.025)
Urobilinogen, UA: 0.2 E.U./dL
pH, UA: 6 (ref 5.0–8.0)

## 2019-12-01 LAB — CBC WITH DIFFERENTIAL/PLATELET
Basophils Absolute: 0.1 10*3/uL (ref 0.0–0.1)
Basophils Relative: 0.6 % (ref 0.0–3.0)
Eosinophils Absolute: 0.3 10*3/uL (ref 0.0–0.7)
Eosinophils Relative: 3.8 % (ref 0.0–5.0)
HCT: 37.5 % (ref 36.0–46.0)
Hemoglobin: 12.4 g/dL (ref 12.0–15.0)
Lymphocytes Relative: 26.5 % (ref 12.0–46.0)
Lymphs Abs: 2.3 10*3/uL (ref 0.7–4.0)
MCHC: 33.1 g/dL (ref 30.0–36.0)
MCV: 80.7 fl (ref 78.0–100.0)
Monocytes Absolute: 0.8 10*3/uL (ref 0.1–1.0)
Monocytes Relative: 9 % (ref 3.0–12.0)
Neutro Abs: 5.3 10*3/uL (ref 1.4–7.7)
Neutrophils Relative %: 60.1 % (ref 43.0–77.0)
Platelets: 277 10*3/uL (ref 150.0–400.0)
RBC: 4.65 Mil/uL (ref 3.87–5.11)
RDW: 14.3 % (ref 11.5–15.5)
WBC: 8.8 10*3/uL (ref 4.0–10.5)

## 2019-12-01 LAB — COMPREHENSIVE METABOLIC PANEL
ALT: 13 U/L (ref 0–35)
AST: 14 U/L (ref 0–37)
Albumin: 4.3 g/dL (ref 3.5–5.2)
Alkaline Phosphatase: 49 U/L (ref 39–117)
BUN: 8 mg/dL (ref 6–23)
CO2: 28 mEq/L (ref 19–32)
Calcium: 9.4 mg/dL (ref 8.4–10.5)
Chloride: 105 mEq/L (ref 96–112)
Creatinine, Ser: 0.68 mg/dL (ref 0.40–1.20)
GFR: 121.61 mL/min (ref >60.00)
Glucose, Bld: 100 mg/dL — ABNORMAL HIGH (ref 70–99)
Potassium: 3.8 mEq/L (ref 3.5–5.1)
Sodium: 137 mEq/L (ref 135–145)
Total Bilirubin: 0.4 mg/dL (ref 0.2–1.2)
Total Protein: 7.3 g/dL (ref 6.0–8.3)

## 2019-12-01 LAB — LIPASE: Lipase: 10 U/L — ABNORMAL LOW (ref 11.0–59.0)

## 2019-12-01 LAB — POCT URINE PREGNANCY: Preg Test, Ur: NEGATIVE

## 2019-12-01 MED ORDER — ONDANSETRON HCL 4 MG PO TABS: 4.0000 mg | ORAL_TABLET | Freq: Three times a day (TID) | ORAL | 0 refills | Status: DC | PRN

## 2019-12-01 MED ORDER — SUCRALFATE 1 GM/10ML PO SUSP: 1.0000 g | Freq: Three times a day (TID) | ORAL | 0 refills | Status: DC

## 2019-12-01 MED ORDER — SUCRALFATE 1 G PO TABS: 1.0000 g | ORAL_TABLET | Freq: Three times a day (TID) | ORAL | 0 refills | Status: DC

## 2019-12-01 MED ORDER — OMEPRAZOLE 40 MG PO CPDR
40.0000 mg | DELAYED_RELEASE_CAPSULE | Freq: Every day | ORAL | 3 refills | Status: DC
Start: 2019-12-01 — End: 2020-12-03

## 2019-12-01 NOTE — Patient Instructions (Signed)
If severe pain.Marland Kitchen go to ER. Push clear liquids, no food until after CT scan. Can use zofran for nausea. Await in  Lab or waiting room for appt for CT scan to be scheduled before you leave.

## 2019-12-01 NOTE — Telephone Encounter (Signed)
[facility] Night - Client Nonclinical Telephone Record AccessNurse Client [facility] Night - Client Client Site [facility] - Night Physician Kerby Nora - MD Contact Type Call Who Is Calling Patient / Member / Family / Caregiver Caller Name Alira Fretwell Caller Phone Number [phone] Patient Name [name] Patient DOB 15-Jul-1985 Call Type Message Only Information Provided Reason for Call Medication Question / Request Initial Comment Caller states she was seen this morning, sent meds to pharmacy, but needing prior authorization, before it can be given to the PT. She is asking for a call back. Medication: unknown. Additional Comment Provided information for the office, connected caller for further assistance. Disp. Time Disposition Final User 12/01/2019 1:19:14 PM General Information Provided Yes Kendall Flack Call Closed By: Kendall Flack Transaction Date/Time: 12/01/2019 1:14:53 PM (ET)

## 2019-12-01 NOTE — Telephone Encounter (Signed)
Pt requests note for work for being out today.  She is at hospital now getting her CT.

## 2019-12-01 NOTE — Progress Notes (Signed)
Chief Complaint  Patient presents with  . Abdominal Pain    x 5 months  . Back Pain  . Shortness of Breath    History of Present Illness: HPI 34 year old obese  female with DM, history of anemia presents  with new onset abdominal pain in last 5 month. History gastric sleeve surgery 2016  She reports pain in epigastrium and RUQ.. radiates to right flank. Started as intermittent every other week, now in last week it has become constant.. nagging pain. 10/10 on pain scale.  Cannot stand up straight given pain.  Keeping her up at night.  Pain makes  In last week she has started vomiting after eating.. food, stomach acid. No blood. BMs are regular.   Using omeprazole.. control GERD.   Pain worse after eating.  NO dysuria, no urine change. Body mass index is 35.4 kg/m.   Hx of cholecystectomy.  Lab Results  Component Value Date   HGBA1C 6.7 (H) 11/29/2018    Has using tylenol for pain.. helped initial but not now. Heating pad helps some.  Not using NSAIDs, no ETOH.  Menses is regular on Nexplanon Urinalysis shows large blood. No LE. Upreg negative  This visit occurred during the SARS-CoV-2 public health emergency.  Safety protocols were in place, including screening questions prior to the visit, additional usage of staff PPE, and extensive cleaning of exam room while observing appropriate contact time as indicated for disinfecting solutions.   COVID 19 screen:  No recent travel or known exposure to COVID19 The patient denies respiratory symptoms of COVID 19 at this time. The importance of social distancing was discussed today.     Review of Systems  Constitutional: Negative for chills and fever.  HENT: Negative for congestion and ear pain.   Eyes: Negative for pain and redness.  Respiratory: Negative for cough and shortness of breath.   Cardiovascular: Negative for chest pain, palpitations and leg swelling.  Gastrointestinal: Positive for abdominal pain, nausea and  vomiting. Negative for blood in stool, constipation and diarrhea.  Genitourinary: Negative for dysuria.  Musculoskeletal: Negative for falls and myalgias.  Skin: Negative for rash.  Neurological: Negative for dizziness.  Psychiatric/Behavioral: Negative for depression. The patient is not nervous/anxious.       Past Medical History:  Diagnosis Date  . Diabetes mellitus without complication ([place])    before gastric sleeve procedure  . GERD (gastroesophageal reflux disease)   . Headache    when BP is up  . Hypertension   . Wears contact lenses     reports that she has never smoked. She has never used smokeless tobacco. She reports that she does not drink alcohol and does not use drugs.   Current Outpatient Medications:  .  etonogestrel (NEXPLANON) 68 MG IMPL implant, 1 each by Subdermal route once., Disp: , Rfl:  .  ferrous sulfate 324 MG TBEC, Take 324 mg by mouth daily., Disp: , Rfl:  .  Multiple Vitamins-Minerals (MULTIVITAMIN WOMEN PO), Take 1 tablet by mouth daily., Disp: , Rfl:    Observations/Objective: Blood pressure 130/90, pulse 72, temperature (!) 97.3 F (36.3 C), temperature source Temporal, height 5\' 7"  (1.702 m), weight 226 lb (102.5 kg), SpO2 99 %.  Physical Exam Constitutional:      General: She is not in acute distress.    Appearance: Normal appearance. She is well-developed. She is not ill-appearing or toxic-appearing.  HENT:     Head: Normocephalic.     Right Ear: Hearing, tympanic membrane, ear  canal and external ear normal. Tympanic membrane is not erythematous, retracted or bulging.     Left Ear: Hearing, tympanic membrane, ear canal and external ear normal. Tympanic membrane is not erythematous, retracted or bulging.     Nose: No mucosal edema or rhinorrhea.     Right Sinus: No maxillary sinus tenderness or frontal sinus tenderness.     Left Sinus: No maxillary sinus tenderness or frontal sinus tenderness.     Mouth/Throat:     Pharynx: Uvula midline.   Eyes:     General: Lids are normal. Lids are everted, no foreign bodies appreciated.     Conjunctiva/sclera: Conjunctivae normal.     Pupils: Pupils are equal, round, and reactive to light.  Neck:     Thyroid: No thyroid mass or thyromegaly.     Vascular: No carotid bruit.     Trachea: Trachea normal.  Cardiovascular:     Rate and Rhythm: Normal rate and regular rhythm.     Pulses: Normal pulses.     Heart sounds: Normal heart sounds, S1 normal and S2 normal. No murmur heard.  No friction rub. No gallop.   Pulmonary:     Effort: Pulmonary effort is normal. No tachypnea or respiratory distress.     Breath sounds: Normal breath sounds. No decreased breath sounds, wheezing, rhonchi or rales.  Abdominal:     General: Abdomen is flat. Bowel sounds are normal. There is no distension.     Palpations: Abdomen is soft.     Tenderness: There is abdominal tenderness in the right upper quadrant and epigastric area. There is right CVA tenderness and guarding. There is no rebound.  Musculoskeletal:     Cervical back: Normal range of motion and neck supple.  Skin:    General: Skin is warm and dry.     Findings: No rash.  Neurological:     Mental Status: She is alert.  Psychiatric:        Mood and Affect: Mood is not anxious or depressed.        Speech: Speech normal.        Behavior: Behavior normal. Behavior is cooperative.        Thought Content: Thought content normal.        Judgment: Judgment normal.      Assessment and Plan  Right flank pain, RUQ pain and epigastric pain:  Differential includes PUD, pancreatitis, liver issue ( s/p cholecystectomy)  but in light of hematuria.. renal stone most liekly.  Eval with labs and renal scan CT STAT.   Can use tylenol for pain and phenergan for nausea.  Push clear liquids.  ER precautions given.        Kerby Nora, MD

## 2019-12-01 NOTE — Telephone Encounter (Signed)
Work note written

## 2019-12-01 NOTE — Telephone Encounter (Deleted)
Error

## 2019-12-01 NOTE — Addendum Note (Signed)
Addended by: Damita Lack on: 12/01/2019 11:58 AM   Modules accepted: Orders

## 2019-12-01 NOTE — Telephone Encounter (Signed)
New message  Seen today need prior authorization on  omeprazole (PRILOSEC) 40 MG capsule  [id number] - [place], [place] - [address] AT [facility] OF [address] [facility]

## 2019-12-01 NOTE — Addendum Note (Signed)
Addended by: Damita Lack on: 12/01/2019 09:29 AM   Modules accepted: Orders

## 2019-12-01 NOTE — Telephone Encounter (Signed)
I received a PA for the Carafate Solution but not the omeprazole.  PA completed for Carafate on CoverMyMeds. It was sent for a review.  Can take up to 72 hours for a decision.  I think insurance prefers pill form.  Dr. Ermalene Searing will send in Rx for tablets.   Spoke with pharmacist at [facility] to advise her to cancel Carafate solution and replace with tablets.  No PA is needed for Omeprazole.  Left message for [name] that medication issue has been resolved and she can pick up both prescriptions at [facility] at her convenience.

## 2019-12-01 NOTE — Telephone Encounter (Signed)
[name] with CT called report on CT abd and pelvis w.o contrast. Report is in epic and taking to Dr Ermalene Searing and pt is waiting.

## 2019-12-01 NOTE — Telephone Encounter (Signed)
Changes carafate to tablet.

## 2019-12-03 LAB — URINE CULTURE
MICRO NUMBER:: 10608239
SPECIMEN QUALITY:: ADEQUATE

## 2019-12-04 ENCOUNTER — Encounter: Payer: Self-pay | Admitting: Family Medicine

## 2019-12-04 MED ORDER — CEPHALEXIN 500 MG PO CAPS
500.0000 mg | ORAL_CAPSULE | Freq: Two times a day (BID) | ORAL | 0 refills | Status: AC
Start: 2019-12-04 — End: 2019-12-11

## 2019-12-04 NOTE — Addendum Note (Signed)
Addended by: Hannah Beat on: 12/04/2019 10:32 AM   Modules accepted: Orders

## 2019-12-04 NOTE — Telephone Encounter (Signed)
done

## 2019-12-13 ENCOUNTER — Ambulatory Visit (INDEPENDENT_AMBULATORY_CARE_PROVIDER_SITE_OTHER): Payer: BC Managed Care – PPO | Admitting: Obstetrics and Gynecology

## 2019-12-13 ENCOUNTER — Encounter: Payer: Self-pay | Admitting: Obstetrics and Gynecology

## 2019-12-13 ENCOUNTER — Other Ambulatory Visit: Payer: Self-pay

## 2019-12-13 VITALS — BP 135/90 | HR 89 | Ht 66.0 in | Wt 226.0 lb

## 2019-12-13 DIAGNOSIS — Z1239 Encounter for other screening for malignant neoplasm of breast: Secondary | ICD-10-CM

## 2019-12-13 DIAGNOSIS — Z3046 Encounter for surveillance of implantable subdermal contraceptive: Secondary | ICD-10-CM | POA: Diagnosis not present

## 2019-12-13 DIAGNOSIS — Z01419 Encounter for gynecological examination (general) (routine) without abnormal findings: Secondary | ICD-10-CM | POA: Diagnosis not present

## 2019-12-13 NOTE — Progress Notes (Signed)
Gynecology Annual Exam   PCP: Excell Seltzer, MD  Chief Complaint:  Chief Complaint  Patient presents with  . Gynecologic Exam    History of Present Illness: Patient is a 34 y.o. G2P1011 presents for annual exam. The patient has no complaints today.   LMP: No LMP recorded. Patient has had an implant. Absent on nexplanon  The patient is sexually active. She currently uses Nexplanon for contraception. She denies dyspareunia.  The patient does perform self breast exams.  There is no notable family history of breast or ovarian cancer in her family.  The patient wears seatbelts: yes.   The patient has regular exercise: not asked.    The patient denies current symptoms of depression.    Review of Systems: Review of Systems  Constitutional: Negative for chills and fever.  HENT: Negative for congestion.   Respiratory: Negative for cough and shortness of breath.   Cardiovascular: Negative for chest pain and palpitations.  Gastrointestinal: Negative for abdominal pain, constipation, diarrhea, heartburn, nausea and vomiting.  Genitourinary: Negative for dysuria, frequency and urgency.  Skin: Negative for itching and rash.  Neurological: Negative for dizziness and headaches.  Endo/Heme/Allergies: Negative for polydipsia.  Psychiatric/Behavioral: Negative for depression.    Past Medical History:  Patient Active Problem List   Diagnosis Date Noted  . Family history of breast cancer 04/03/2019  . De Quervain's tenosynovitis, right 03/30/2019  . Subacromial bursitis of right shoulder joint 03/30/2019  . Acute pain of right wrist 11/29/2018  . Multiple joint pain 11/29/2018  . Rotator cuff tendinitis, left 11/10/2018  . Greater trochanteric bursitis, left 11/10/2018  . Left cervical radiculopathy 01/25/2018  . Iron deficiency anemia 05/18/2017  . Menorrhagia 05/18/2017  . Sepsis (HCC) 05/21/2016  . Fatigue 03/19/2016  . Non-intractable cyclical vomiting with nausea   . Reflux  esophagitis   . Abdominal pain, epigastric   . GERD (gastroesophageal reflux disease) 05/07/2015  . Bariatric surgery status 03/19/2015  . Class 2 obesity without serious comorbidity with body mass index (BMI) of 37.0 to 37.9 in adult 05/16/2013  . Type II diabetes mellitus, well controlled (HCC) 09/22/2012  . Hx of primary hypertension 09/22/2012    Past Surgical History:  Past Surgical History:  Procedure Laterality Date  . CESAREAN SECTION  04/30/2009   decreased fetal heart tones due to umbilical cord wrapped  . CHOLECYSTECTOMY    . ESOPHAGOGASTRODUODENOSCOPY (EGD) WITH PROPOFOL N/A 10/24/2015   Procedure: ESOPHAGOGASTRODUODENOSCOPY (EGD) WITH PROPOFOL;  Surgeon: Midge Minium, MD;  Location: [facility];  Service: Endoscopy;  Laterality: N/A;  . LAPAROSCOPIC GASTRIC SLEEVE RESECTION N/A 03/19/2015   Procedure: LAPAROSCOPIC GASTRIC SLEEVE RESECTION;  Surgeon: Everette Rank, MD;  Location: [facility];  Service: General;  Laterality: N/A;    Gynecologic History:  No LMP recorded. Patient has had an implant. Contraception:08/31/2018 Nexplanon Last Pap: Results were:12/01/2018 NIL and HR HPV negative   Obstetric History: G2P1011  Family History:  Family History  Problem Relation Age of Onset  . Diabetes Father   . Hypertension Father   . Hyperlipidemia Father   . Hypertension Sister   . Birth defects Sister        fluid on brain cause blindness in B eye  . Breast cancer Maternal Aunt 30       has contact  . Breast cancer Maternal Aunt 30       has contact  . Breast cancer Cousin 30       has contact  .  Prostate cancer Paternal Grandfather     Social History:  Social History   Socioeconomic History  . Marital status: Single    Spouse name: Not on file  . Number of children: Not on file  . Years of education: Not on file  . Highest education level: Not on file  Occupational History  . Not on file  Tobacco Use  . Smoking status: Never Smoker  . Smokeless  tobacco: Never Used  Vaping Use  . Vaping Use: Never used  Substance and Sexual Activity  . Alcohol use: No    Comment: 1 drink/mo  . Drug use: No  . Sexual activity: Yes    Partners: Male    Birth control/protection: Implant    Comment: Nutraplanada  Other Topics Concern  . Not on file  Social History Narrative   Single, on child age 72.   Social Determinants of Health   Financial Resource Strain:   . Difficulty of Paying Living Expenses:   Food Insecurity:   . Worried About Programme researcher, broadcasting/film/video in the Last Year:   . Barista in the Last Year:   Transportation Needs:   . Freight forwarder (Medical):   Marland Kitchen Lack of Transportation (Non-Medical):   Physical Activity:   . Days of Exercise per Week:   . Minutes of Exercise per Session:   Stress:   . Feeling of Stress :   Social Connections:   . Frequency of Communication with Friends and Family:   . Frequency of Social Gatherings with Friends and Family:   . Attends Religious Services:   . Active Member of Clubs or Organizations:   . Attends Banker Meetings:   Marland Kitchen Marital Status:   Intimate Partner Violence:   . Fear of Current or Ex-Partner:   . Emotionally Abused:   Marland Kitchen Physically Abused:   . Sexually Abused:     Allergies:  Allergies  Allergen Reactions  . Tomato Rash    Medications: Prior to Admission medications   Medication Sig Start Date End Date Taking? Authorizing Provider  etonogestrel (NEXPLANON) 68 MG IMPL implant 1 each by Subdermal route once.   Yes [provider]  ferrous sulfate 324 MG TBEC Take 324 mg by mouth daily.   Yes [provider]  Multiple Vitamins-Minerals (MULTIVITAMIN WOMEN PO) Take 1 tablet by mouth daily.   Yes [provider]  omeprazole (PRILOSEC) 40 MG capsule Take 1 capsule (40 mg total) by mouth daily. 12/01/19  Yes Bedsole, Amy E, MD    Physical Exam Vitals: Blood pressure 135/90, pulse 89, height 5\' 6"  (1.676 m), weight 226 lb  (102.5 kg).  General: NAD HEENT: normocephalic, anicteric Thyroid: no enlargement, no palpable nodules Pulmonary: No increased work of breathing, CTAB Cardiovascular: RRR, distal pulses 2+ Breast: Breast symmetrical, no tenderness, no palpable nodules or masses, no skin or nipple retraction present, no nipple discharge.  No axillary or supraclavicular lymphadenopathy. Abdomen: NABS, soft, non-tender, non-distended.  Umbilicus without lesions.  No hepatomegaly, splenomegaly or masses palpable. No evidence of hernia  Genitourinary:  External: Normal external female genitalia.  Normal urethral meatus, normal Bartholin's and Skene's glands.    Vagina: Normal vaginal mucosa, no evidence of prolapse.    Cervix: Grossly normal in appearance, no bleeding  Uterus: Non-enlarged, mobile, normal contour.  No CMT  Adnexa: ovaries non-enlarged, no adnexal masses  Rectal: deferred  Lymphatic: no evidence of inguinal lymphadenopathy Extremities: no edema, erythema, or tenderness Neurologic: Grossly intact Psychiatric:  mood appropriate, affect full  Female chaperone present for pelvic and breast  portions of the physical exam    Assessment: 34 y.o. G2P1011 routine annual exam  Plan: Problem List Items Addressed This Visit    None    Visit Diagnoses    Encounter for gynecological examination without abnormal finding    -  Primary   Breast screening       Encounter for surveillance of implantable subdermal contraceptive          1) STI screening  wasoffered and declined  2)  ASCCP guidelines and rational discussed.  Patient opts for every 3 years screening interval  3) Contraception - the patient is currently using  Nexplanon.  She is happy with her current form of contraception and plans to continue  4) Routine healthcare maintenance including cholesterol, diabetes screening discussed managed by PCP  5) Return in about 1 year (around 12/12/2020) for annual.   Vena Austria, MD,  [facility], [facility] 12/13/2019, 2:44 PM

## 2020-03-11 DIAGNOSIS — S96911A Strain of unspecified muscle and tendon at ankle and foot level, right foot, initial encounter: Secondary | ICD-10-CM | POA: Diagnosis not present

## 2020-03-11 DIAGNOSIS — S92504A Nondisplaced unspecified fracture of right lesser toe(s), initial encounter for closed fracture: Secondary | ICD-10-CM | POA: Diagnosis not present

## 2020-05-16 DIAGNOSIS — R059 Cough, unspecified: Secondary | ICD-10-CM | POA: Diagnosis not present

## 2020-05-16 DIAGNOSIS — M791 Myalgia, unspecified site: Secondary | ICD-10-CM | POA: Diagnosis not present

## 2020-05-16 DIAGNOSIS — K219 Gastro-esophageal reflux disease without esophagitis: Secondary | ICD-10-CM | POA: Diagnosis not present

## 2020-05-16 DIAGNOSIS — R0602 Shortness of breath: Secondary | ICD-10-CM | POA: Diagnosis not present

## 2020-05-16 DIAGNOSIS — Z79899 Other long term (current) drug therapy: Secondary | ICD-10-CM | POA: Diagnosis not present

## 2020-05-16 DIAGNOSIS — E119 Type 2 diabetes mellitus without complications: Secondary | ICD-10-CM | POA: Diagnosis not present

## 2020-05-16 DIAGNOSIS — R42 Dizziness and giddiness: Secondary | ICD-10-CM | POA: Diagnosis not present

## 2020-05-16 DIAGNOSIS — Z20822 Contact with and (suspected) exposure to covid-19: Secondary | ICD-10-CM | POA: Diagnosis not present

## 2020-05-16 DIAGNOSIS — I10 Essential (primary) hypertension: Secondary | ICD-10-CM | POA: Diagnosis not present

## 2020-05-16 DIAGNOSIS — R519 Headache, unspecified: Secondary | ICD-10-CM | POA: Diagnosis not present

## 2020-05-16 DIAGNOSIS — Z7984 Long term (current) use of oral hypoglycemic drugs: Secondary | ICD-10-CM | POA: Diagnosis not present

## 2020-05-16 DIAGNOSIS — Z794 Long term (current) use of insulin: Secondary | ICD-10-CM | POA: Diagnosis not present

## 2020-07-11 DIAGNOSIS — Z23 Encounter for immunization: Secondary | ICD-10-CM | POA: Diagnosis not present

## 2020-07-23 NOTE — Telephone Encounter (Signed)
Apt cancelled per Epic. 

## 2020-11-12 ENCOUNTER — Encounter: Payer: Self-pay | Admitting: Obstetrics

## 2020-11-12 ENCOUNTER — Ambulatory Visit (INDEPENDENT_AMBULATORY_CARE_PROVIDER_SITE_OTHER): Payer: BC Managed Care – PPO | Admitting: Obstetrics

## 2020-11-12 ENCOUNTER — Other Ambulatory Visit: Payer: Self-pay

## 2020-11-12 VITALS — BP 140/80 | Ht 67.0 in | Wt 218.4 lb

## 2020-11-12 DIAGNOSIS — R399 Unspecified symptoms and signs involving the genitourinary system: Secondary | ICD-10-CM | POA: Diagnosis not present

## 2020-11-12 DIAGNOSIS — R109 Unspecified abdominal pain: Secondary | ICD-10-CM

## 2020-11-12 DIAGNOSIS — R300 Dysuria: Secondary | ICD-10-CM

## 2020-11-12 LAB — POCT URINALYSIS DIPSTICK
Bilirubin, UA: NEGATIVE
Blood, UA: NEGATIVE
Glucose, UA: NEGATIVE
Protein, UA: NEGATIVE
Spec Grav, UA: >=1.03 — AB (ref 1.010–1.025)
Urobilinogen, UA: 0.2 E.U./dL
pH, UA: 5 (ref 5.0–8.0)

## 2020-11-12 MED ORDER — SULFAMETHOXAZOLE-TRIMETHOPRIM 800-160 MG PO TABS: 1.0000 | ORAL_TABLET | Freq: Two times a day (BID) | ORAL | 1 refills | Status: DC

## 2020-11-12 NOTE — Progress Notes (Signed)
Obstetrics & Gynecology Office Visit   Chief Complaint:  Chief Complaint  Patient presents with  . Gynecologic Exam    History of Present Illness: [name] presents complaining of UTI symptoms that started several days ago. She c/o urinary urgency, but doesn't produce much urine, as well as dysuria. She has also noticed some back pain, more so on her right side than her left. She denies any fever or flu like symptoms.   Review of Systems:  Review of Systems  Genitourinary: Positive for dysuria and urgency.  All other systems reviewed and are negative.    Past Medical History:  Past Medical History:  Diagnosis Date  . Diabetes mellitus without complication (HCC)    before gastric sleeve procedure  . GERD (gastroesophageal reflux disease)   . Headache    when BP is up  . Hypertension   . Wears contact lenses     Past Surgical History:  Past Surgical History:  Procedure Laterality Date  . CESAREAN SECTION  04/30/2009   decreased fetal heart tones due to umbilical cord wrapped  . CHOLECYSTECTOMY    . ESOPHAGOGASTRODUODENOSCOPY (EGD) WITH PROPOFOL N/A 10/24/2015   Procedure: ESOPHAGOGASTRODUODENOSCOPY (EGD) WITH PROPOFOL;  Surgeon: Midge Minium, MD;  Location: [facility];  Service: Endoscopy;  Laterality: N/A;  . LAPAROSCOPIC GASTRIC SLEEVE RESECTION N/A 03/19/2015   Procedure: LAPAROSCOPIC GASTRIC SLEEVE RESECTION;  Surgeon: Everette Rank, MD;  Location: [facility];  Service: General;  Laterality: N/A;    Gynecologic History: No LMP recorded. Patient has had an implant.  Obstetric History: G2P1011  Family History:  Family History  Problem Relation Age of Onset  . Diabetes Father   . Hypertension Father   . Hyperlipidemia Father   . Hypertension Sister   . Birth defects Sister        fluid on brain cause blindness in B eye  . Breast cancer Maternal Aunt 30       has contact  . Breast cancer Maternal Aunt 30       has contact  . Breast cancer Cousin 30        has contact  . Prostate cancer Paternal Grandfather     Social History:  Social History   Socioeconomic History  . Marital status: Single    Spouse name: Not on file  . Number of children: Not on file  . Years of education: Not on file  . Highest education level: Not on file  Occupational History  . Not on file  Tobacco Use  . Smoking status: Never Smoker  . Smokeless tobacco: Never Used  Vaping Use  . Vaping Use: Never used  Substance and Sexual Activity  . Alcohol use: No    Comment: 1 drink/mo  . Drug use: No  . Sexual activity: Yes    Partners: Male    Birth control/protection: Implant    Comment: Nutraplanada  Other Topics Concern  . Not on file  Social History Narrative   Single, on child age 73.   Social Determinants of Health   Financial Resource Strain: Not on file  Food Insecurity: Not on file  Transportation Needs: Not on file  Physical Activity: Not on file  Stress: Not on file  Social Connections: Not on file  Intimate Partner Violence: Not on file    Allergies:  Allergies  Allergen Reactions  . Tomato Rash    Medications: Prior to Admission medications   Medication Sig Start Date End Date Taking? Authorizing Provider  etonogestrel (NEXPLANON) 68 MG IMPL implant 1 each by Subdermal route once.   Yes [provider]  ferrous sulfate 324 MG TBEC Take 324 mg by mouth daily.   Yes [provider]  Multiple Vitamins-Minerals (MULTIVITAMIN WOMEN PO) Take 1 tablet by mouth daily.   Yes [provider]  omeprazole (PRILOSEC) 40 MG capsule Take 1 capsule (40 mg total) by mouth daily. 12/01/19  Yes Bedsole, Amy E, MD  sulfamethoxazole-trimethoprim (BACTRIM DS) 800-160 MG tablet Take 1 tablet by mouth 2 (two) times daily. 11/12/20  Yes Mirna Mires, CNM    Physical Exam Vitals:  Vitals:   11/12/20 1507  BP: 140/80   No LMP recorded. Patient has had an implant.  Physical Exam Constitutional:      Appearance:  Normal appearance. She is obese.  HENT:     Head: Normocephalic and atraumatic.  Cardiovascular:     Rate and Rhythm: Normal rate and regular rhythm.  Pulmonary:     Effort: Pulmonary effort is normal.     Breath sounds: Normal breath sounds.  Abdominal:     Palpations: Abdomen is soft.  Genitourinary:    Comments: Deferred+ CVAT bilaterally. Musculoskeletal:        General: Normal range of motion.     Cervical back: Normal range of motion and neck supple.  Skin:    General: Skin is warm and dry.  Neurological:     General: No focal deficit present.     Mental Status: She is alert and oriented to person, place, and time.  Psychiatric:        Mood and Affect: Mood normal.    Urine dip shows + Leukocytes and + nitrites  Assessment: 35 y.o. G2P1011 with UTI symptoms, dysuria and flank  Plan: Problem List Items Addressed This Visit   None   Visit Diagnoses    UTI symptoms    -  Primary   Relevant Medications   sulfamethoxazole-trimethoprim (BACTRIM DS) 800-160 MG tablet   Dysuria       Relevant Medications   sulfamethoxazole-trimethoprim (BACTRIM DS) 800-160 MG tablet   Other Relevant Orders   POCT Urinalysis Dipstick (Completed)   Urine Culture   Complaint of flank pain         WE will start her on Bactrim while awaiting the urine culture results. Consider renal ultrasound should she not improve.  Mirna Mires, CNM  11/12/2020 6:43 PM

## 2020-11-12 NOTE — Progress Notes (Signed)
po

## 2020-11-15 ENCOUNTER — Other Ambulatory Visit: Payer: Self-pay | Admitting: Obstetrics

## 2020-11-15 DIAGNOSIS — N390 Urinary tract infection, site not specified: Secondary | ICD-10-CM

## 2020-11-15 DIAGNOSIS — A499 Bacterial infection, unspecified: Secondary | ICD-10-CM

## 2020-11-15 LAB — URINE CULTURE

## 2020-11-15 MED ORDER — NITROFURANTOIN MONOHYD MACRO 100 MG PO CAPS
100.0000 mg | ORAL_CAPSULE | Freq: Two times a day (BID) | ORAL | 0 refills | Status: AC
Start: 2020-11-15 — End: 2020-11-20

## 2020-11-15 NOTE — Progress Notes (Unsigned)
Patient seen for UTI symptoms. Her urine culture came back today and is + for E Coli. She was placed on Bactrim, but willl need to switch her to Macrobid. I have put in an order for the Macrobid. Mirna Mires, CNM  11/15/2020 1:42 PM

## 2020-12-02 ENCOUNTER — Encounter: Payer: Self-pay | Admitting: Emergency Medicine

## 2020-12-02 ENCOUNTER — Other Ambulatory Visit: Payer: Self-pay

## 2020-12-02 ENCOUNTER — Emergency Department
Admission: EM | Admit: 2020-12-02 | Discharge: 2020-12-02 | Disposition: A | Discharge status: Home / Self Care | Payer: BC Managed Care – PPO | Attending: Emergency Medicine | Admitting: Emergency Medicine

## 2020-12-02 DIAGNOSIS — N390 Urinary tract infection, site not specified: Secondary | ICD-10-CM | POA: Diagnosis not present

## 2020-12-02 DIAGNOSIS — I10 Essential (primary) hypertension: Secondary | ICD-10-CM | POA: Insufficient documentation

## 2020-12-02 DIAGNOSIS — E1169 Type 2 diabetes mellitus with other specified complication: Secondary | ICD-10-CM | POA: Insufficient documentation

## 2020-12-02 DIAGNOSIS — B9689 Other specified bacterial agents as the cause of diseases classified elsewhere: Secondary | ICD-10-CM | POA: Diagnosis not present

## 2020-12-02 DIAGNOSIS — K219 Gastro-esophageal reflux disease without esophagitis: Secondary | ICD-10-CM | POA: Diagnosis not present

## 2020-12-02 DIAGNOSIS — R102 Pelvic and perineal pain: Secondary | ICD-10-CM

## 2020-12-02 DIAGNOSIS — R1030 Lower abdominal pain, unspecified: Secondary | ICD-10-CM | POA: Diagnosis not present

## 2020-12-02 LAB — LIPASE, BLOOD: Lipase: 29 U/L (ref 11–51)

## 2020-12-02 LAB — URINALYSIS, COMPLETE (UACMP) WITH MICROSCOPIC
Bilirubin Urine: NEGATIVE
Glucose, UA: NEGATIVE mg/dL
Hgb urine dipstick: NEGATIVE
Ketones, ur: NEGATIVE mg/dL
Nitrite: POSITIVE — AB
Protein, ur: NEGATIVE mg/dL
Specific Gravity, Urine: 1.017 (ref 1.005–1.030)
pH: 6 (ref 5.0–8.0)

## 2020-12-02 LAB — COMPREHENSIVE METABOLIC PANEL
ALT: 18 U/L (ref 0–44)
AST: 16 U/L (ref 15–41)
Albumin: 3.6 g/dL (ref 3.5–5.0)
Alkaline Phosphatase: 50 U/L (ref 38–126)
Anion gap: 6 (ref 5–15)
BUN: 8 mg/dL (ref 6–20)
CO2: 24 mmol/L (ref 22–32)
Calcium: 8.3 mg/dL — ABNORMAL LOW (ref 8.9–10.3)
Chloride: 108 mmol/L (ref 98–111)
Creatinine, Ser: 0.68 mg/dL (ref 0.44–1.00)
GFR, Estimated: >60 mL/min (ref >60)
Glucose, Bld: 113 mg/dL — ABNORMAL HIGH (ref 70–99)
Potassium: 3.3 mmol/L — ABNORMAL LOW (ref 3.5–5.1)
Sodium: 138 mmol/L (ref 135–145)
Total Bilirubin: 0.4 mg/dL (ref 0.3–1.2)
Total Protein: 6.8 g/dL (ref 6.5–8.1)

## 2020-12-02 LAB — POC URINE PREG, ED: Preg Test, Ur: NEGATIVE

## 2020-12-02 LAB — CBC
HCT: 32.4 % — ABNORMAL LOW (ref 36.0–46.0)
Hemoglobin: 10.8 g/dL — ABNORMAL LOW (ref 12.0–15.0)
MCH: 25.8 pg — ABNORMAL LOW (ref 26.0–34.0)
MCHC: 33.3 g/dL (ref 30.0–36.0)
MCV: 77.3 fL — ABNORMAL LOW (ref 80.0–100.0)
Platelets: 269 10*3/uL (ref 150–400)
RBC: 4.19 MIL/uL (ref 3.87–5.11)
RDW: 14.6 % (ref 11.5–15.5)
WBC: 8.3 10*3/uL (ref 4.0–10.5)
nRBC: 0 % (ref 0.0–0.2)

## 2020-12-02 MED ORDER — CEFDINIR 300 MG PO CAPS: 300.0000 mg | ORAL_CAPSULE | Freq: Two times a day (BID) | ORAL | 0 refills | Status: AC

## 2020-12-02 NOTE — ED Provider Notes (Signed)
[facility] Emergency Department Provider Note   ____________________________________________   Event Date/Time   First MD Initiated Contact with Patient 12/02/20 1550     (approximate)  I have reviewed the triage vital signs and the nursing notes.   HISTORY  Chief Complaint Abdominal Pain    HPI [name] is a 35 y.o. female with below stated past medical history who presents for suprapubic pain that radiates through to her back and began approximately 2 days prior to arrival.  Patient states that this pain is associated with mild dysuria.  Patient states she was seen at her OB/GYN's office on 11/11/2020 and found to have urinary tract infection at that time for which she was treated with Bactrim for 7 days.  Patient states that the symptoms had fully resolved until they recurred 2 days prior to arrival.  Patient describes aching, 8/10 abdominal pain that radiates through to her back.  Patient currently denies any vision changes, tinnitus, difficulty speaking, facial droop, sore throat, chest pain, shortness of breath, nausea/vomiting/diarrhea, or weakness/numbness/paresthesias in any extremity         Past Medical History:  Diagnosis Date   Diabetes mellitus without complication (HCC)    before gastric sleeve procedure   GERD (gastroesophageal reflux disease)    Headache    when BP is up   Hypertension    Wears contact lenses     Patient Active Problem List   Diagnosis Date Noted   Family history of breast cancer 04/03/2019   De Quervain's tenosynovitis, right 03/30/2019   Subacromial bursitis of right shoulder joint 03/30/2019   Acute pain of right wrist 11/29/2018   Multiple joint pain 11/29/2018   Rotator cuff tendinitis, left 11/10/2018   Greater trochanteric bursitis, left 11/10/2018   Left cervical radiculopathy 01/25/2018   Iron deficiency anemia 05/18/2017   Menorrhagia 05/18/2017   Sepsis (HCC) 05/21/2016   Fatigue 03/19/2016    Non-intractable cyclical vomiting with nausea    Reflux esophagitis    Abdominal pain, epigastric    GERD (gastroesophageal reflux disease) 05/07/2015   Bariatric surgery status 03/19/2015   Class 2 obesity without serious comorbidity with body mass index (BMI) of 37.0 to 37.9 in adult 05/16/2013   Type II diabetes mellitus, well controlled (HCC) 09/22/2012   Hx of primary hypertension 09/22/2012    Past Surgical History:  Procedure Laterality Date   CESAREAN SECTION  04/30/2009   decreased fetal heart tones due to umbilical cord wrapped   CHOLECYSTECTOMY     ESOPHAGOGASTRODUODENOSCOPY (EGD) WITH PROPOFOL N/A 10/24/2015   Procedure: ESOPHAGOGASTRODUODENOSCOPY (EGD) WITH PROPOFOL;  Surgeon: Midge Minium, MD;  Location: [facility];  Service: Endoscopy;  Laterality: N/A;   LAPAROSCOPIC GASTRIC SLEEVE RESECTION N/A 03/19/2015   Procedure: LAPAROSCOPIC GASTRIC SLEEVE RESECTION;  Surgeon: Everette Rank, MD;  Location: [facility];  Service: General;  Laterality: N/A;    Prior to Admission medications   Medication Sig Start Date End Date Taking? Authorizing Provider  cefdinir (OMNICEF) 300 MG capsule Take 1 capsule (300 mg total) by mouth 2 (two) times daily for 5 days. 12/02/20 12/07/20 Yes Merwyn Katos, MD  etonogestrel (NEXPLANON) 68 MG IMPL implant 1 each by Subdermal route once.    [provider]  ferrous sulfate 324 MG TBEC Take 324 mg by mouth daily.    [provider]  Multiple Vitamins-Minerals (MULTIVITAMIN WOMEN PO) Take 1 tablet by mouth daily.    [provider]  omeprazole (PRILOSEC) 40 MG  capsule Take 1 capsule (40 mg total) by mouth daily. 12/01/19   Excell Seltzer, MD    Allergies Tomato  Family History  Problem Relation Age of Onset   Diabetes Father    Hypertension Father    Hyperlipidemia Father    Hypertension Sister    Birth defects Sister        fluid on brain cause blindness in B eye   Breast cancer Maternal Aunt 30        has contact   Breast cancer Maternal Aunt 30       has contact   Breast cancer Cousin 30       has contact   Prostate cancer Paternal Grandfather     Social History Social History   Tobacco Use   Smoking status: Never   Smokeless tobacco: Never  Vaping Use   Vaping Use: Never used  Substance Use Topics   Alcohol use: No    Comment: 1 drink/mo   Drug use: No    Review of Systems Constitutional: No fever/chills Eyes: No visual changes. ENT: No sore throat. Cardiovascular: Denies chest pain. Respiratory: Denies shortness of breath. Gastrointestinal: Endorses suprapubic abdominal pain.  No nausea, no vomiting.  No diarrhea. Genitourinary: Positive for dysuria. Musculoskeletal: Negative for acute arthralgias Skin: Negative for rash. Neurological: Negative for headaches, weakness/numbness/paresthesias in any extremity Psychiatric: Negative for suicidal ideation/homicidal ideation   ____________________________________________   PHYSICAL EXAM:  VITAL SIGNS: ED Triage Vitals [12/02/20 1427]  Enc Vitals Group     BP 130/90     Pulse Rate 75     Resp 20     Temp 98.5 F (36.9 C)     Temp Source Oral     SpO2 100 %     Weight 217 lb (98.4 kg)     Height 5\' 6"  (1.676 m)     Head Circumference      Peak Flow      Pain Score 8     Pain Loc      Pain Edu?      Excl. in GC?    Constitutional: Alert and oriented. Well appearing and in no acute distress. Eyes: Conjunctivae are normal. PERRL. Head: Atraumatic. Nose: No congestion/rhinnorhea. Mouth/Throat: Mucous membranes are moist. Neck: No stridor Cardiovascular: Grossly normal heart sounds.  Good peripheral circulation. Respiratory: Normal respiratory effort.  No retractions. Gastrointestinal: Soft and nontender.  No CVA tenderness to percussion.  No distention. Musculoskeletal: No obvious deformities Neurologic:  Normal speech and language. No gross focal neurologic deficits are appreciated. Skin:  Skin is warm  and dry. No rash noted. Psychiatric: Mood and affect are normal. Speech and behavior are normal.  ____________________________________________   LABS (all labs ordered are listed, but only abnormal results are displayed)  Labs Reviewed  COMPREHENSIVE METABOLIC PANEL - Abnormal; Notable for the following components:      Result Value   Potassium 3.3 (*)    Glucose, Bld 113 (*)    Calcium 8.3 (*)    All other components within normal limits  CBC - Abnormal; Notable for the following components:   Hemoglobin 10.8 (*)    HCT 32.4 (*)    MCV 77.3 (*)    MCH 25.8 (*)    All other components within normal limits  URINALYSIS, COMPLETE (UACMP) WITH MICROSCOPIC - Abnormal; Notable for the following components:   Color, Urine YELLOW (*)    APPearance HAZY (*)    Nitrite POSITIVE (*)    Leukocytes,Ua  SMALL (*)    Bacteria, UA MANY (*)    All other components within normal limits  LIPASE, BLOOD  POC URINE PREG, ED   PROCEDURES  Procedure(s) performed (including Critical Care):  .1-3 Lead EKG Interpretation  Date/Time: 12/02/2020 4:57 PM Performed by: Merwyn Katos, MD Authorized by: Merwyn Katos, MD     Interpretation: normal     ECG rate:  74   ECG rate assessment: normal     Rhythm: sinus rhythm     Ectopy: none     Conduction: normal     ____________________________________________   INITIAL IMPRESSION / ASSESSMENT AND PLAN / ED COURSE  As part of my medical decision making, I reviewed the following data within the electronic MEDICAL RECORD NUMBER Nursing notes reviewed and incorporated, Labs reviewed, EKG interpreted, Old chart reviewed, Radiograph reviewed and Notes from prior ED visits reviewed and incorporated        Not Pregnant. Unlikely TOA, Ovarian Torsion, PID, gonorrhea/chlamydia. Low suspicion for Infected Urolithiasis, AAA, Cholecystitis, Pancreatitis, SBO, Appendicitis, or other acute abdomen.  Rx: Cefdinir 300 mg BID for 5 days Disposition: Discharge  home. SRP discussed. Advise follow up with primary care provider within 24-72 hours.      ____________________________________________   FINAL CLINICAL IMPRESSION(S) / ED DIAGNOSES  Final diagnoses:  Lower urinary tract infectious disease  Suprapubic pain, acute     ED Discharge Orders          Ordered    cefdinir (OMNICEF) 300 MG capsule  2 times daily        12/02/20 1641             Note:  This document was prepared using Dragon voice recognition software and may include unintentional dictation errors.    Merwyn Katos, MD 12/02/20 [phone]

## 2020-12-02 NOTE — ED Triage Notes (Signed)
Pt via POV from home. Pt c/o lower back pain and lower abdominal pain since Saturday. Pt is c/o urinary frequency. Denies NVD. Denies fever. Denies vaginal bleeding. Pt is A&Ox4 and NAD.

## 2020-12-03 ENCOUNTER — Telehealth: Payer: Self-pay | Admitting: Family Medicine

## 2020-12-03 NOTE — Telephone Encounter (Signed)
Please schedule CPE with fasting labs with Dr. Bedsole. 

## 2020-12-03 NOTE — Telephone Encounter (Signed)
Spoke with patient scheduled CPE with fasting labs 

## 2020-12-17 DIAGNOSIS — R079 Chest pain, unspecified: Secondary | ICD-10-CM | POA: Diagnosis not present

## 2020-12-17 DIAGNOSIS — M542 Cervicalgia: Secondary | ICD-10-CM | POA: Diagnosis not present

## 2020-12-17 DIAGNOSIS — Z5321 Procedure and treatment not carried out due to patient leaving prior to being seen by health care provider: Secondary | ICD-10-CM | POA: Diagnosis not present

## 2020-12-18 ENCOUNTER — Encounter (HOSPITAL_COMMUNITY): Payer: Self-pay

## 2020-12-18 ENCOUNTER — Ambulatory Visit (HOSPITAL_COMMUNITY)
Admission: EM | Admit: 2020-12-18 | Discharge: 2020-12-18 | Disposition: A | Discharge status: Home / Self Care | Payer: BC Managed Care – PPO | Attending: Internal Medicine | Admitting: Internal Medicine

## 2020-12-18 ENCOUNTER — Other Ambulatory Visit: Payer: Self-pay

## 2020-12-18 DIAGNOSIS — S46911A Strain of unspecified muscle, fascia and tendon at shoulder and upper arm level, right arm, initial encounter: Secondary | ICD-10-CM

## 2020-12-18 DIAGNOSIS — R0789 Other chest pain: Secondary | ICD-10-CM

## 2020-12-18 DIAGNOSIS — Z87828 Personal history of other (healed) physical injury and trauma: Secondary | ICD-10-CM | POA: Diagnosis not present

## 2020-12-18 MED ORDER — TIZANIDINE HCL 2 MG PO CAPS: 2.0000 mg | ORAL_CAPSULE | Freq: Three times a day (TID) | ORAL | 0 refills | Status: DC

## 2020-12-18 NOTE — ED Provider Notes (Signed)
[facility]    CSN: [id number] Arrival date & time: 12/18/20  7353      History   Chief Complaint Chief Complaint  Patient presents with   Chest Pain    HPI [name] is a 35 y.o. female presenting with R chest wall pain x2 days.  History of diabetes, GERD, headaches, hypertension, L rotator cuff strain.  Notes few months of intermittent right shoulder and right-sided chest wall pain.  Symptoms started around the same time she started a boot camp.  States that she has had the chest wall pain constantly for the last 2 days, describes this as sharp and worse with inspiration and movement.  Also with significant right shoulder pain, difficulty moving the arm due to stiffness and pain.  Denies sensation changes, neck pain, back pain, weakness.  Has never followed with an orthopedist for her rotator cuff issues in the past.  HPI  Past Medical History:  Diagnosis Date   Diabetes mellitus without complication (HCC)    before gastric sleeve procedure   GERD (gastroesophageal reflux disease)    Headache    when BP is up   Hypertension    Wears contact lenses     Patient Active Problem List   Diagnosis Date Noted   Family history of breast cancer 04/03/2019   De Quervain's tenosynovitis, right 03/30/2019   Subacromial bursitis of right shoulder joint 03/30/2019   Acute pain of right wrist 11/29/2018   Multiple joint pain 11/29/2018   Rotator cuff tendinitis, left 11/10/2018   Greater trochanteric bursitis, left 11/10/2018   Left cervical radiculopathy 01/25/2018   Iron deficiency anemia 05/18/2017   Menorrhagia 05/18/2017   Sepsis (HCC) 05/21/2016   Fatigue 03/19/2016   Non-intractable cyclical vomiting with nausea    Reflux esophagitis    Abdominal pain, epigastric    GERD (gastroesophageal reflux disease) 05/07/2015   Bariatric surgery status 03/19/2015   Class 2 obesity without serious comorbidity with body mass index (BMI) of 37.0 to 37.9 in adult  05/16/2013   Type II diabetes mellitus, well controlled (HCC) 09/22/2012   Hx of primary hypertension 09/22/2012    Past Surgical History:  Procedure Laterality Date   CESAREAN SECTION  04/30/2009   decreased fetal heart tones due to umbilical cord wrapped   CHOLECYSTECTOMY     ESOPHAGOGASTRODUODENOSCOPY (EGD) WITH PROPOFOL N/A 10/24/2015   Procedure: ESOPHAGOGASTRODUODENOSCOPY (EGD) WITH PROPOFOL;  Surgeon: Midge Minium, MD;  Location: [facility];  Service: Endoscopy;  Laterality: N/A;   LAPAROSCOPIC GASTRIC SLEEVE RESECTION N/A 03/19/2015   Procedure: LAPAROSCOPIC GASTRIC SLEEVE RESECTION;  Surgeon: Everette Rank, MD;  Location: [facility];  Service: General;  Laterality: N/A;    OB History     Gravida  2   Para  1   Term  1   Preterm      AB  1   Living  1      SAB  1   IAB      Ectopic      Multiple      Live Births  1            Home Medications    Prior to Admission medications   Medication Sig Start Date End Date Taking? Authorizing Provider  tizanidine (ZANAFLEX) 2 MG capsule Take 1 capsule (2 mg total) by mouth 3 (three) times daily. 12/18/20  Yes Rhys Martini, PA-C  etonogestrel (NEXPLANON) 68 MG IMPL implant 1 each by Subdermal route once.  [provider]  ferrous sulfate 324 MG TBEC Take 324 mg by mouth daily.    [provider]  Multiple Vitamins-Minerals (MULTIVITAMIN WOMEN PO) Take 1 tablet by mouth daily.    [provider]  omeprazole (PRILOSEC) 40 MG capsule TAKE 1 CAPSULE(40 MG) BY MOUTH DAILY 12/03/20   Excell Seltzer, MD    Family History Family History  Problem Relation Age of Onset   Diabetes Father    Hypertension Father    Hyperlipidemia Father    Hypertension Sister    Birth defects Sister        fluid on brain cause blindness in B eye   Breast cancer Maternal Aunt 30       has contact   Breast cancer Maternal Aunt 30       has contact   Breast cancer Cousin 30       has contact    Prostate cancer Paternal Grandfather     Social History Social History   Tobacco Use   Smoking status: Never   Smokeless tobacco: Never  Vaping Use   Vaping Use: Never used  Substance Use Topics   Alcohol use: No    Comment: 1 drink/mo   Drug use: No     Allergies   Tomato   Review of Systems Review of Systems  Musculoskeletal:        R shoulder and chest wall pain  All other systems reviewed and are negative.   Physical Exam Triage Vital Signs ED Triage Vitals  Enc Vitals Group     BP 12/18/20 1020 (!) 147/97     Pulse Rate 12/18/20 1020 68     Resp 12/18/20 1020 19     Temp 12/18/20 1023 98.6 F (37 C)     Temp Source 12/18/20 1023 Oral     SpO2 12/18/20 1020 99 %     Weight --      Height --      Head Circumference --      Peak Flow --      Pain Score 12/18/20 1021 6     Pain Loc --      Pain Edu? --      Excl. in GC? --    No data found.  Updated Vital Signs BP (!) 147/97 (BP Location: Right Arm)   Pulse 68   Temp 98.6 F (37 C) (Oral)   Resp 19   LMP  (LMP Unknown)   SpO2 99%   Visual Acuity Right Eye Distance:   Left Eye Distance:   Bilateral Distance:    Right Eye Near:   Left Eye Near:    Bilateral Near:     Physical Exam Vitals reviewed.  Constitutional:      General: She is not in acute distress.    Appearance: Normal appearance. She is not ill-appearing or diaphoretic.  HENT:     Head: Normocephalic and atraumatic.     Mouth/Throat:     Mouth: Mucous membranes are moist.  Eyes:     Extraocular Movements: Extraocular movements intact.     Pupils: Pupils are equal, round, and reactive to light.  Cardiovascular:     Rate and Rhythm: Normal rate and regular rhythm.     Pulses:          Radial pulses are 2+ on the right side and 2+ on the left side.     Heart sounds: Normal heart sounds.  Pulmonary:     Effort:  Pulmonary effort is normal.     Breath sounds: Normal breath sounds.  Chest:     Chest wall: Tenderness present.      Comments: Significant R anterior chest wall pain to palpation. Pain elicited with abduction R arm against resistance. Abdominal:     Palpations: Abdomen is soft.     Tenderness: There is no abdominal tenderness. There is no guarding or rebound.  Musculoskeletal:     Right lower leg: No edema.     Left lower leg: No edema.     Comments: R shoulder- exam limited due to patient discomfort. Diffusely TTP R shoulder joint and proximal trapezius. Abduction limited due to pain. Positive empty beer can. Negative neer and crossbody adduction. Sensation intact. Grip strength 5/5 bilaterally, radial pulse 2+, cap refill <2 seconds.   Skin:    General: Skin is warm.     Capillary Refill: Capillary refill takes less than 2 seconds.  Neurological:     General: No focal deficit present.     Mental Status: She is alert and oriented to person, place, and time.  Psychiatric:        Mood and Affect: Mood normal.        Behavior: Behavior normal.        Thought Content: Thought content normal.        Judgment: Judgment normal.     UC Treatments / Results  Labs (all labs ordered are listed, but only abnormal results are displayed) Labs Reviewed - No data to display  EKG   Radiology No results found.  Procedures Procedures (including critical care time)  Medications Ordered in UC Medications - No data to display  Initial Impression / Assessment and Plan / UC Course  I have reviewed the triage vital signs and the nursing notes.  Pertinent labs & imaging results that were available during my care of the patient were reviewed by me and considered in my medical decision making (see chart for details).     This patient is a very pleasant 35 y.o. year old female presenting with chest wall pain and suspected R rotator cuff tendonitis.  Pain is MSK in nature. EKG NSR, unchanged from 2018 EKG.  Reassurance, trial of zanaflex and ibuprofen, recommended following up with Ortho at the earliest  convenience.  ED return precautions discussed. Patient verbalizes understanding and agreement.    Final Clinical Impressions(s) / UC Diagnoses   Final diagnoses:  Chest wall pain  History of rotator cuff strain  Strain of right shoulder, initial encounter     Discharge Instructions      -Start the muscle relaxer-Zanaflex (tizanidine), up to 3 times daily for muscle spasms and pain.  This can make you drowsy, so take at bedtime or when you do not need to drive or operate machinery. -For additional relief, Take Tylenol 1000 mg 3 times daily, and ibuprofen 800 mg 3 times daily with food.  You can take these together, or alternate every 3-4 hours. -Schedule an appointment with [facility] for further evaluation and management.  I suspect that you are having an issue with the right rotator cuff, similar to this you had in the past with the left rotator cuff.  You can call [facility] or schedule this appointment online. -If you develop new symptoms like left-sided chest pain, worsening of chest pain, dizziness, weakness-seek additional immediate medical attention. -Avoid strenuous exercise while symptoms persist     ED Prescriptions     Medication Sig Dispense Auth. Provider  tizanidine (ZANAFLEX) 2 MG capsule Take 1 capsule (2 mg total) by mouth 3 (three) times daily. 21 capsule Rhys Martini, PA-C      PDMP not reviewed this encounter.   Rhys Martini, PA-C 12/18/20 1216

## 2020-12-18 NOTE — ED Triage Notes (Signed)
Pt c/o chest pain X 2 days.   States the pain is shooting from the chest to the right side of the beck. States she has taken Tylenol for the pain with no relief.   Pt states she has been unable to move her right arm.

## 2020-12-18 NOTE — Discharge Instructions (Addendum)
-  Start the muscle relaxer-Zanaflex (tizanidine), up to 3 times daily for muscle spasms and pain.  This can make you drowsy, so take at bedtime or when you do not need to drive or operate machinery. -For additional relief, Take Tylenol 1000 mg 3 times daily, and ibuprofen 800 mg 3 times daily with food.  You can take these together, or alternate every 3-4 hours. -Schedule an appointment with [facility] for further evaluation and management.  I suspect that you are having an issue with the right rotator cuff, similar to this you had in the past with the left rotator cuff.  You can call [facility] or schedule this appointment online. -If you develop new symptoms like left-sided chest pain, worsening of chest pain, dizziness, weakness-seek additional immediate medical attention. -Avoid strenuous exercise while symptoms persist

## 2020-12-23 ENCOUNTER — Ambulatory Visit: Payer: BC Managed Care – PPO | Admitting: Obstetrics and Gynecology

## 2020-12-23 NOTE — Progress Notes (Deleted)
Gynecology Annual Exam   PCP: Excell Seltzer, MD  Chief Complaint: No chief complaint on file.   History of Present Illness: Patient is a 35 y.o. [name] presents for annual exam. The patient has no complaints today.   LMP: No LMP recorded (lmp unknown). Patient has had an implant. Average Interval: {Desc; regular/irreg:14544}, {numbers 22-35:14824} days Duration of flow: {numbers; 0-10:33138} days Heavy Menses: {yes/no:63} Clots: {yes/no:63} Intermenstrual Bleeding: {yes/no:63} Postcoital Bleeding: {yes/no:63} Dysmenorrhea: {yes/no:63}  The patient {sys sexually active:13135} sexually active. She currently uses {method:5051} for contraception. She {has/denies:315300} dyspareunia.  The patient {DOES_DOES INO:67672} perform self breast exams.  There {is/is no:19420} notable family history of breast or ovarian cancer in her family.  The patient wears seatbelts: {yes/no:63}.   The patient has regular exercise: {yes/no/not asked:9010}.    The patient {Blank single:19197::"reports","denies"} current symptoms of depression.    Review of Systems: ROS  Past Medical History:  Patient Active Problem List   Diagnosis Date Noted   Family history of breast cancer 04/03/2019   De Quervain's tenosynovitis, right 03/30/2019   Subacromial bursitis of right shoulder joint 03/30/2019   Acute pain of right wrist 11/29/2018   Multiple joint pain 11/29/2018   Rotator cuff tendinitis, left 11/10/2018   Greater trochanteric bursitis, left 11/10/2018   Left cervical radiculopathy 01/25/2018   Iron deficiency anemia 05/18/2017   Menorrhagia 05/18/2017   Sepsis (HCC) 05/21/2016   Fatigue 03/19/2016   Non-intractable cyclical vomiting with nausea    Reflux esophagitis    Abdominal pain, epigastric    GERD (gastroesophageal reflux disease) 05/07/2015   Bariatric surgery status 03/19/2015   Class 2 obesity without serious comorbidity with body mass index (BMI) of 37.0 to 37.9 in adult  05/16/2013   Type II diabetes mellitus, well controlled (HCC) 09/22/2012   Hx of primary hypertension 09/22/2012    Past Surgical History:  Past Surgical History:  Procedure Laterality Date   CESAREAN SECTION  04/30/2009   decreased fetal heart tones due to umbilical cord wrapped   CHOLECYSTECTOMY     ESOPHAGOGASTRODUODENOSCOPY (EGD) WITH PROPOFOL N/A 10/24/2015   Procedure: ESOPHAGOGASTRODUODENOSCOPY (EGD) WITH PROPOFOL;  Surgeon: Midge Minium, MD;  Location: [facility];  Service: Endoscopy;  Laterality: N/A;   LAPAROSCOPIC GASTRIC SLEEVE RESECTION N/A 03/19/2015   Procedure: LAPAROSCOPIC GASTRIC SLEEVE RESECTION;  Surgeon: Everette Rank, MD;  Location: [facility];  Service: General;  Laterality: N/A;    Gynecologic History:  No LMP recorded (lmp unknown). Patient has had an implant. Contraception: 08/31/2018 Nexplanon Last Pap: Results were:12/01/2018 NIL and HR HPV negative   Obstetric History: [name]  Family History:  Family History  Problem Relation Age of Onset   Diabetes Father    Hypertension Father    Hyperlipidemia Father    Hypertension Sister    Birth defects Sister        fluid on brain cause blindness in B eye   Breast cancer Maternal Aunt 30       has contact   Breast cancer Maternal Aunt 30       has contact   Breast cancer Cousin 30       has contact   Prostate cancer Paternal Grandfather     Social History:  Social History   Socioeconomic History   Marital status: Single    Spouse name: Not on file   Number of children: Not on file   Years of education: Not on file   Highest education level: Not on file  Occupational History   Not on file  Tobacco Use   Smoking status: Never   Smokeless tobacco: Never  Vaping Use   Vaping Use: Never used  Substance and Sexual Activity   Alcohol use: No    Comment: 1 drink/mo   Drug use: No   Sexual activity: Yes    Partners: Male    Birth control/protection: Implant    Comment: Nutraplanada   Other Topics Concern   Not on file  Social History Narrative   Single, on child age 85.   Social Determinants of Health   Financial Resource Strain: Not on file  Food Insecurity: Not on file  Transportation Needs: Not on file  Physical Activity: Not on file  Stress: Not on file  Social Connections: Not on file  Intimate Partner Violence: Not on file    Allergies:  Allergies  Allergen Reactions   Tomato Rash    Medications: Prior to Admission medications   Medication Sig Start Date End Date Taking? Authorizing Provider  etonogestrel (NEXPLANON) 68 MG IMPL implant 1 each by Subdermal route once.    [provider]  ferrous sulfate 324 MG TBEC Take 324 mg by mouth daily.    [provider]  Multiple Vitamins-Minerals (MULTIVITAMIN WOMEN PO) Take 1 tablet by mouth daily.    [provider]  omeprazole (PRILOSEC) 40 MG capsule TAKE 1 CAPSULE(40 MG) BY MOUTH DAILY 12/03/20   Bedsole, Amy E, MD  tizanidine (ZANAFLEX) 2 MG capsule Take 1 capsule (2 mg total) by mouth 3 (three) times daily. 12/18/20   Rhys Martini, PA-C    Physical Exam Vitals: ***  General: NAD HEENT: normocephalic, anicteric Thyroid: no enlargement, no palpable nodules Pulmonary: No increased work of breathing, CTAB Cardiovascular: RRR, distal pulses 2+ Breast: Breast symmetrical, no tenderness, no palpable nodules or masses, no skin or nipple retraction present, no nipple discharge.  No axillary or supraclavicular lymphadenopathy. Abdomen: NABS, soft, non-tender, non-distended.  Umbilicus without lesions.  No hepatomegaly, splenomegaly or masses palpable. No evidence of hernia  Genitourinary:  External: Normal external female genitalia.  Normal urethral meatus, normal Bartholin's and Skene's glands.    Vagina: Normal vaginal mucosa, no evidence of prolapse.    Cervix: Grossly normal in appearance, no bleeding  Uterus: Non-enlarged, mobile, normal contour.  No CMT  Adnexa: ovaries  non-enlarged, no adnexal masses  Rectal: deferred  Lymphatic: no evidence of inguinal lymphadenopathy Extremities: no edema, erythema, or tenderness Neurologic: Grossly intact Psychiatric: mood appropriate, affect full  Female chaperone present for pelvic and breast  portions of the physical exam    Assessment: 35 y.o. [name] routine annual exam  Plan: Problem List Items Addressed This Visit   None   2) STI screening  {Blank single:19197::"was","was not"}offered and {Blank single:19197::"accepted","declined","therefore not obtained"}  2)  ASCCP guidelines and rational discussed.  Patient opts for {Blank single:19197::"***","every 5 years","every 3 years","yearly","discontinue age >65","discontinue secondary to prior hysterectomy"} screening interval  3) Contraception - the patient is currently using  {method:5051}.  She is {Blank single:19197::"happy with her current form of contraception and plans to continue","interested in changing to ***","interested in starting Contraception: ***","not currently in need of contraception secondary to being sterile","attempting to conceive in the near future"}  4) Routine healthcare maintenance including cholesterol, diabetes screening discussed {Blank single:19197::"managed by PCP","Ordered today","To return fasting at a later date","Declines"}  5) No follow-ups on file.   Vena Austria, MD, [facility], [facility] 12/23/2020, 3:02 PM

## 2021-01-08 ENCOUNTER — Telehealth: Payer: Self-pay | Admitting: Family Medicine

## 2021-01-08 DIAGNOSIS — D509 Iron deficiency anemia, unspecified: Secondary | ICD-10-CM

## 2021-01-08 DIAGNOSIS — Z1159 Encounter for screening for other viral diseases: Secondary | ICD-10-CM

## 2021-01-08 DIAGNOSIS — E119 Type 2 diabetes mellitus without complications: Secondary | ICD-10-CM

## 2021-01-08 NOTE — Telephone Encounter (Signed)
-----   Message from Alvina Chou sent at 12/23/2020  9:25 AM EDT ----- Regarding: Lab orders for Thursday, 7.28.22 Patient is scheduled for CPX labs, please order future labs, Thanks , Camelia Eng

## 2021-01-09 ENCOUNTER — Other Ambulatory Visit: Payer: Self-pay

## 2021-01-09 ENCOUNTER — Other Ambulatory Visit (INDEPENDENT_AMBULATORY_CARE_PROVIDER_SITE_OTHER): Payer: BC Managed Care – PPO

## 2021-01-09 DIAGNOSIS — Z1159 Encounter for screening for other viral diseases: Secondary | ICD-10-CM

## 2021-01-09 DIAGNOSIS — E119 Type 2 diabetes mellitus without complications: Secondary | ICD-10-CM

## 2021-01-09 DIAGNOSIS — D509 Iron deficiency anemia, unspecified: Secondary | ICD-10-CM

## 2021-01-09 LAB — LIPID PANEL
Cholesterol: 148 mg/dL (ref 0–200)
HDL: 46.6 mg/dL (ref >39.00)
LDL Cholesterol: 90 mg/dL (ref 0–99)
NonHDL: 101.78
Total CHOL/HDL Ratio: 3
Triglycerides: 58 mg/dL (ref 0.0–149.0)
VLDL: 11.6 mg/dL (ref 0.0–40.0)

## 2021-01-09 LAB — CBC WITH DIFFERENTIAL/PLATELET
Basophils Absolute: 0 10*3/uL (ref 0.0–0.1)
Basophils Relative: 0.6 % (ref 0.0–3.0)
Eosinophils Absolute: 0.2 10*3/uL (ref 0.0–0.7)
Eosinophils Relative: 3.8 % (ref 0.0–5.0)
HCT: 37.7 % (ref 36.0–46.0)
Hemoglobin: 12.2 g/dL (ref 12.0–15.0)
Lymphocytes Relative: 30.9 % (ref 12.0–46.0)
Lymphs Abs: 2 10*3/uL (ref 0.7–4.0)
MCHC: 32.2 g/dL (ref 30.0–36.0)
MCV: 78.6 fl (ref 78.0–100.0)
Monocytes Absolute: 0.6 10*3/uL (ref 0.1–1.0)
Monocytes Relative: 8.8 % (ref 3.0–12.0)
Neutro Abs: 3.6 10*3/uL (ref 1.4–7.7)
Neutrophils Relative %: 55.9 % (ref 43.0–77.0)
Platelets: 278 10*3/uL (ref 150.0–400.0)
RBC: 4.8 Mil/uL (ref 3.87–5.11)
RDW: 15.2 % (ref 11.5–15.5)
WBC: 6.4 10*3/uL (ref 4.0–10.5)

## 2021-01-09 LAB — COMPREHENSIVE METABOLIC PANEL
ALT: 13 U/L (ref 0–35)
AST: 12 U/L (ref 0–37)
Albumin: 4.1 g/dL (ref 3.5–5.2)
Alkaline Phosphatase: 45 U/L (ref 39–117)
BUN: 10 mg/dL (ref 6–23)
CO2: 26 mEq/L (ref 19–32)
Calcium: 9.2 mg/dL (ref 8.4–10.5)
Chloride: 106 mEq/L (ref 96–112)
Creatinine, Ser: 0.79 mg/dL (ref 0.40–1.20)
GFR: 98.75 mL/min (ref >60.00)
Glucose, Bld: 95 mg/dL (ref 70–99)
Potassium: 4 mEq/L (ref 3.5–5.1)
Sodium: 140 mEq/L (ref 135–145)
Total Bilirubin: 0.4 mg/dL (ref 0.2–1.2)
Total Protein: 7.2 g/dL (ref 6.0–8.3)

## 2021-01-09 LAB — MICROALBUMIN / CREATININE URINE RATIO
Creatinine,U: 174.2 mg/dL
Microalb Creat Ratio: 0.4 mg/g (ref 0.0–30.0)
Microalb, Ur: <0.7 mg/dL (ref 0.0–1.9)

## 2021-01-09 LAB — HEMOGLOBIN A1C: Hgb A1c MFr Bld: 6.4 % (ref 4.6–6.5)

## 2021-01-10 LAB — HEPATITIS C ANTIBODY
Hepatitis C Ab: NONREACTIVE
SIGNAL TO CUT-OFF: 0.01 (ref <1.00)

## 2021-01-10 NOTE — Progress Notes (Signed)
No critical labs need to be addressed urgently. We will discuss labs in detail at upcoming office visit.   

## 2021-01-16 ENCOUNTER — Encounter: Payer: BC Managed Care – PPO | Admitting: Family Medicine

## 2021-01-30 ENCOUNTER — Ambulatory Visit: Payer: BC Managed Care – PPO | Admitting: Family Medicine

## 2021-03-05 ENCOUNTER — Other Ambulatory Visit: Payer: Self-pay | Admitting: Family Medicine

## 2021-03-11 ENCOUNTER — Ambulatory Visit: Payer: BC Managed Care – PPO | Admitting: Family Medicine

## 2021-03-14 ENCOUNTER — Telehealth: Payer: Self-pay

## 2021-03-14 ENCOUNTER — Ambulatory Visit: Payer: Self-pay

## 2021-03-14 ENCOUNTER — Ambulatory Visit: Payer: BC Managed Care – PPO | Admitting: Family Medicine

## 2021-03-14 NOTE — Telephone Encounter (Signed)
I spoke with pt and she started on 03/11/21 with lower lt back pain and blood in urine; pt saw blood in urine 03/11/21 and 03/12/21 and pt said blood in urine stopped on 03/13/21 and no blood today but still has lt lower back pain and frequency of urine and voiding smaller amts than usual. No urgency of urine, no burning or pain when urinates, no abd pain and no fever. Pt wanted to cancel appt this afternoon with Dr Ermalene Searing due to weather and I scheduled pt an appt at [facility] this morning at 10:30. Pt appreciative. Sending note to Dr Ermalene Searing as Lorain Childes. UC & ED precautions also given and pt voiced understanding.

## 2021-03-17 LAB — HM DIABETES FOOT EXAM

## 2021-04-04 ENCOUNTER — Encounter: Payer: Self-pay | Admitting: Family Medicine

## 2021-04-04 ENCOUNTER — Other Ambulatory Visit: Payer: Self-pay

## 2021-04-04 ENCOUNTER — Ambulatory Visit (INDEPENDENT_AMBULATORY_CARE_PROVIDER_SITE_OTHER): Payer: BC Managed Care – PPO | Admitting: Family Medicine

## 2021-04-04 VITALS — BP 122/82 | HR 99 | Temp 97.5°F | Ht 67.0 in | Wt 216.0 lb

## 2021-04-04 DIAGNOSIS — Z0000 Encounter for general adult medical examination without abnormal findings: Secondary | ICD-10-CM

## 2021-04-04 DIAGNOSIS — L03317 Cellulitis of buttock: Secondary | ICD-10-CM | POA: Diagnosis not present

## 2021-04-04 DIAGNOSIS — E119 Type 2 diabetes mellitus without complications: Secondary | ICD-10-CM | POA: Diagnosis not present

## 2021-04-04 DIAGNOSIS — L0231 Cutaneous abscess of buttock: Secondary | ICD-10-CM | POA: Diagnosis not present

## 2021-04-04 DIAGNOSIS — Z23 Encounter for immunization: Secondary | ICD-10-CM | POA: Diagnosis not present

## 2021-04-04 DIAGNOSIS — G5601 Carpal tunnel syndrome, right upper limb: Secondary | ICD-10-CM | POA: Diagnosis not present

## 2021-04-04 MED ORDER — DOXYCYCLINE HYCLATE 100 MG PO TABS: 100.0000 mg | ORAL_TABLET | Freq: Two times a day (BID) | ORAL | 0 refills | Status: DC

## 2021-04-04 MED ORDER — IBUPROFEN 800 MG PO TABS: 800.0000 mg | ORAL_TABLET | Freq: Three times a day (TID) | ORAL | 0 refills | Status: DC | PRN

## 2021-04-04 NOTE — Assessment & Plan Note (Signed)
Well controlled with diet control. Encouraged exercise, weight loss, healthy eating habits.

## 2021-04-04 NOTE — Progress Notes (Signed)
Patient ID: AKEIBA AXELSON, female    DOB: February 17, 1986, 35 y.o.   MRN: [id number]  This visit was conducted in person.  BP 122/82 (BP Location: Left Arm, Patient Position: Sitting, Cuff Size: Normal)   Pulse 99   Temp (!) 97.5 F (36.4 C) (Temporal)   Ht 5\' 7"  (1.702 m)   Wt 216 lb (98 kg)   SpO2 99%   BMI 33.83 kg/m    CC: Chief Complaint  Patient presents with   Annual Exam    Subjective:   HPI: JALACIA MATTILA is a 35 y.o. female presenting on 04/04/2021 for Annual Exam   In last 2 years she has been having pain in right  neck, upper shoulder and right arm.  Pain comes and goes. No triggers known.  Has seen ortho in past for it.  Cervical spine film:  mild degenerative change in spine.  Occ numb... pain worst in forearm.  Types a lot at work.  Acetaminophen 1000 mg prn.    New onset x 2 weeks of sore areas x 2 on upper buttock.  Raised, warm. Hard to tell if red.  No discharge.  Using anteseptic cream.  Has had boils in past.. on abd  or legs.  Sister had MRSA boils on past last contact in 01/2021  Diabetes: Well controlled with diet control. Lab Results  Component Value Date   HGBA1C 6.4 01/09/2021  Using medications without difficulties: Hypoglycemic episodes: Hyperglycemic episodes: Feet problems: Blood Sugars averaging: eye exam within last year: due  Reviewed lab in detail. Diet:  moderate Exercise: minimal, on feet daily.01/11/2021 get heart rate up     Relevant past medical, surgical, family and social history reviewed and updated as indicated. Interim medical history since our last visit reviewed. Allergies and medications reviewed and updated. Outpatient Medications Prior to Visit  Medication Sig Dispense Refill   etonogestrel (NEXPLANON) 68 MG IMPL implant 1 each by Subdermal route once.     ferrous sulfate 324 MG TBEC Take 324 mg by mouth daily.     Multiple Vitamins-Minerals (MULTIVITAMIN WOMEN PO) Take 1 tablet by mouth daily.     omeprazole  (PRILOSEC) 40 MG capsule TAKE 1 CAPSULE(40 MG) BY MOUTH DAILY 30 capsule 0   tizanidine (ZANAFLEX) 2 MG capsule Take 1 capsule (2 mg total) by mouth 3 (three) times daily. 21 capsule 0   No facility-administered medications prior to visit.     Per HPI unless specifically indicated in ROS section below Review of Systems  Constitutional:  Negative for fatigue and fever.  HENT:  Negative for congestion.   Eyes:  Negative for pain.  Respiratory:  Negative for cough and shortness of breath.   Cardiovascular:  Negative for chest pain, palpitations and leg swelling.  Gastrointestinal:  Negative for abdominal pain.  Genitourinary:  Negative for dysuria and vaginal bleeding.  Musculoskeletal:  Negative for back pain.  Neurological:  Negative for syncope, light-headedness and headaches.  Psychiatric/Behavioral:  Negative for dysphoric mood.   Objective:  BP 122/82 (BP Location: Left Arm, Patient Position: Sitting, Cuff Size: Normal)   Pulse 99   Temp (!) 97.5 F (36.4 C) (Temporal)   Ht 5\' 7"  (1.702 m)   Wt 216 lb (98 kg)   SpO2 99%   BMI 33.83 kg/m   Wt Readings from Last 3 Encounters:  04/04/21 216 lb (98 kg)  12/02/20 217 lb (98.4 kg)  11/12/20 218 lb 6.4 oz (99.1 kg)  Physical Exam Vitals and nursing note reviewed.  Constitutional:      General: She is not in acute distress.    Appearance: Normal appearance. She is well-developed. She is not ill-appearing or toxic-appearing.  HENT:     Head: Normocephalic.     Right Ear: Hearing, tympanic membrane, ear canal and external ear normal.     Left Ear: Hearing, tympanic membrane, ear canal and external ear normal.     Nose: Nose normal.  Eyes:     General: Lids are normal. Lids are everted, no foreign bodies appreciated.     Conjunctiva/sclera: Conjunctivae normal.     Pupils: Pupils are equal, round, and reactive to light.  Neck:     Thyroid: No thyroid mass or thyromegaly.     Vascular: No carotid bruit.     Trachea:  Trachea normal.  Cardiovascular:     Rate and Rhythm: Normal rate and regular rhythm.     Heart sounds: Normal heart sounds, S1 normal and S2 normal. No murmur heard.   No gallop.  Pulmonary:     Effort: Pulmonary effort is normal. No respiratory distress.     Breath sounds: Normal breath sounds. No wheezing, rhonchi or rales.  Abdominal:     General: Bowel sounds are normal. There is no distension or abdominal bruit.     Palpations: Abdomen is soft. There is no fluid wave or mass.     Tenderness: There is no abdominal tenderness. There is no guarding or rebound.     Hernia: No hernia is present.  Musculoskeletal:     Right wrist: Tenderness present. No swelling or deformity. Decreased range of motion.     Cervical back: Normal range of motion and neck supple.     Comments: Positive finklestein as well as tinel and phalens  Lymphadenopathy:     Cervical: No cervical adenopathy.  Skin:    General: Skin is warm and dry.     Findings: No rash.     Comments:  4 cm diameter area of indurated tissue with darkening or skin, slight increased warmth, no fluctuance  Neurological:     Mental Status: She is alert.     Cranial Nerves: No cranial nerve deficit.     Sensory: No sensory deficit.  Psychiatric:        Mood and Affect: Mood is not anxious or depressed.        Speech: Speech normal.        Behavior: Behavior normal. Behavior is cooperative.        Judgment: Judgment normal.     Diabetic foot exam: Normal inspection No skin breakdown No calluses  Normal DP pulses Normal sensation to light touch and monofilament Nails normal     Results for orders placed or performed in visit on 01/09/21  CBC with Differential/Platelet  Result Value Ref Range   WBC 6.4 4.0 - 10.5 K/uL   RBC 4.80 3.87 - 5.11 Mil/uL   Hemoglobin 12.2 12.0 - 15.0 g/dL   HCT 54.0 08.6 - 76.1 %   MCV 78.6 78.0 - 100.0 fl   MCHC 32.2 30.0 - 36.0 g/dL   RDW 95.0 93.2 - 67.1 %   Platelets 278.0 150.0 - 400.0  K/uL   Neutrophils Relative % 55.9 43.0 - 77.0 %   Lymphocytes Relative 30.9 12.0 - 46.0 %   Monocytes Relative 8.8 3.0 - 12.0 %   Eosinophils Relative 3.8 0.0 - 5.0 %   Basophils Relative 0.6 0.0 -  3.0 %   Neutro Abs 3.6 1.4 - 7.7 K/uL   Lymphs Abs 2.0 0.7 - 4.0 K/uL   Monocytes Absolute 0.6 0.1 - 1.0 K/uL   Eosinophils Absolute 0.2 0.0 - 0.7 K/uL   Basophils Absolute 0.0 0.0 - 0.1 K/uL  Hepatitis C antibody  Result Value Ref Range   Hepatitis C Ab NON-REACTIVE NON-REACTIVE   SIGNAL TO CUT-OFF 0.01 <1.00  Microalbumin / creatinine urine ratio  Result Value Ref Range   Microalb, Ur <0.7 0.0 - 1.9 mg/dL   Creatinine,U 725.3 mg/dL   Microalb Creat Ratio 0.4 0.0 - 30.0 mg/g  Comprehensive metabolic panel  Result Value Ref Range   Sodium 140 135 - 145 mEq/L   Potassium 4.0 3.5 - 5.1 mEq/L   Chloride 106 96 - 112 mEq/L   CO2 26 19 - 32 mEq/L   Glucose, Bld 95 70 - 99 mg/dL   BUN 10 6 - 23 mg/dL   Creatinine, Ser 6.64 0.40 - 1.20 mg/dL   Total Bilirubin 0.4 0.2 - 1.2 mg/dL   Alkaline Phosphatase 45 39 - 117 U/L   AST 12 0 - 37 U/L   ALT 13 0 - 35 U/L   Total Protein 7.2 6.0 - 8.3 g/dL   Albumin 4.1 3.5 - 5.2 g/dL   GFR 40.34 >74.25 mL/min   Calcium 9.2 8.4 - 10.5 mg/dL  Lipid panel  Result Value Ref Range   Cholesterol 148 0 - 200 mg/dL   Triglycerides 95.6 0.0 - 149.0 mg/dL   HDL 38.75 >64.33 mg/dL   VLDL 29.5 0.0 - 18.8 mg/dL   LDL Cholesterol 90 0 - 99 mg/dL   Total CHOL/HDL Ratio 3    NonHDL 101.78   Hemoglobin A1c  Result Value Ref Range   Hgb A1c MFr Bld 6.4 4.6 - 6.5 %    This visit occurred during the SARS-CoV-2 public health emergency.  Safety protocols were in place, including screening questions prior to the visit, additional usage of staff PPE, and extensive cleaning of exam room while observing appropriate contact time as indicated for disinfecting solutions.   COVID 19 screen:  No recent travel or known exposure to COVID19 The patient denies respiratory  symptoms of COVID 19 at this time. The importance of social distancing was discussed today.   Assessment and Plan   The patient's preventative maintenance and recommended screening tests for an annual wellness exam were reviewed in full today. Brought up to date unless services declined.  Counselled on the importance of diet, exercise, and its role in overall health and mortality. The patient's FH and SH was reviewed, including their home life, tobacco status, and drug and alcohol status.   Vaccines:Consider PNA 23 given DM, ( not interested given now prediabetes range) COVID x 3, flu due, uptodate with td,  Pap/DVE:  11/2018.. followed by GYN. Mammo:  not indicated, no early family history ( MGM with breast cacner).. did have 03/2020 Colon:  not indicated, no early  first degree family history. Smoking Status:none ETOH/ drug use: rare/ none  Hep C: neg  HIV screen:  done  Problem List Items Addressed This Visit     Cellulitis and abscess of buttock    Acute, new Will treat with warm compresses and doxy ( cover for MRSA given sister with remote history) x 10 days. If not improving will I and D. Return precautions given.      Right carpal tunnel syndrome    Acute, new Treat with nighttime  brace, Ibuprofen and ice prn.      Type II diabetes mellitus, well controlled (HCC)    Well controlled with diet control. Encouraged exercise, weight loss, healthy eating habits.       Other Visit Diagnoses     Routine general medical examination at a health care facility    -  Primary         Kerby Nora, MD

## 2021-04-04 NOTE — Assessment & Plan Note (Addendum)
Acute, new Will treat with warm compresses and doxy ( cover for MRSA given sister with remote history) x 10 days. If not improving will I and D. Return precautions given.

## 2021-04-04 NOTE — Assessment & Plan Note (Addendum)
Acute, new Treat with nighttime brace, Ibuprofen and ice prn.

## 2021-04-04 NOTE — Patient Instructions (Addendum)
Set up yearly eye exam for diabetes and have the opthalmologist send [place] a copy of the evaluation for the chart.   For lesion on buttock: Start warm compresses 3 times daily. Complete antibiotics.  Call/follow up if not improving  or if worsening, new fever, increase in pain etc.  For wrist pain:  Treat with carpal tunnel brace on right arm at night.  Start ibuprofen 800 mg every 8 hours x 4-5 days then prn.  Get wrist support at work station. Call if not improving in 4-6 weeks.

## 2021-04-09 ENCOUNTER — Other Ambulatory Visit: Payer: Self-pay | Admitting: Family Medicine

## 2021-08-07 ENCOUNTER — Other Ambulatory Visit: Payer: Self-pay | Admitting: Family Medicine

## 2021-08-10 ENCOUNTER — Other Ambulatory Visit: Payer: Self-pay

## 2021-08-10 ENCOUNTER — Emergency Department: Payer: BC Managed Care – PPO

## 2021-08-10 ENCOUNTER — Emergency Department
Admission: EM | Admit: 2021-08-10 | Discharge: 2021-08-10 | Disposition: A | Discharge status: Home / Self Care | Payer: BC Managed Care – PPO | Attending: Emergency Medicine | Admitting: Emergency Medicine

## 2021-08-10 DIAGNOSIS — M25512 Pain in left shoulder: Secondary | ICD-10-CM | POA: Insufficient documentation

## 2021-08-10 DIAGNOSIS — I10 Essential (primary) hypertension: Secondary | ICD-10-CM | POA: Diagnosis not present

## 2021-08-10 DIAGNOSIS — E119 Type 2 diabetes mellitus without complications: Secondary | ICD-10-CM | POA: Diagnosis not present

## 2021-08-10 NOTE — ED Triage Notes (Signed)
Pt states woke up on Saturday morning after moving a bed on Friday with left sided shoulder pain. Pt states she is not able to raise left arm and complains of pain with palpation of left shoulder. Cms intact to fingers.

## 2021-08-10 NOTE — ED Provider Notes (Signed)
[facility] Provider Note    Event Date/Time   First MD Initiated Contact with Patient 08/10/21 2045     (approximate)   History   Chief Complaint left shoulder pain   HPI [name] is a 36 y.o. female, history of type 2 diabetes, hypertension, GERD, rotator cuff tendinitis, presents to the emergency department for evaluation of left shoulder pain.  Patient states that she woke up yesterday morning, after moving a bed the night prior, with significant pain in her left shoulder.  Patient states that she is unable to lift her arm without significant pain. Denies any recent falls/traumatic incidents or recent illnesses.  Denies fever/chills, numbness/tingliness in upper extremities, forearm pain, hand pain, neck pain, chest pain, shortness of breath, or headache.  History Limitations: No limitations.      Physical Exam  Triage Vital Signs: ED Triage Vitals [08/10/21 2029]  Enc Vitals Group     BP 128/81     Pulse Rate 91     Resp 16     Temp 98.7 F (37.1 C)     Temp Source Oral     SpO2 100 %     Weight 218 lb (98.9 kg)     Height 5\' 6"  (1.676 m)     Head Circumference      Peak Flow      Pain Score 9     Pain Loc      Pain Edu?      Excl. in GC?     Most recent vital signs: Vitals:   08/10/21 2029 08/10/21 2227  BP: 128/81 (!) 132/92  Pulse: 91 78  Resp: 16 17  Temp: 98.7 F (37.1 C)   SpO2: 100% 100%    General: Awake, NAD.  CV: Good peripheral perfusion.  Resp: Normal effort.  Abd: Soft, non-tender. No distention.  Neuro: At baseline. No gross neurological deficits. Other: No gross deformities.  Tenderness when palpating the left shoulder, particularly on the anterior aspect.  No tenderness in the upper arm or elbow.  Patient maintains normal passive range of motion, though is notably resistant due to pain.  Normal external and internal rotation.  Pulse, motor, sensation intact distally.  Not applicable  Physical  Exam    ED Results / Procedures / Treatments  Labs (all labs ordered are listed, but only abnormal results are displayed) Labs Reviewed - No data to display   EKG Not applicable.   RADIOLOGY  ED Provider Interpretation: I personally reviewed this x-ray, no evidence of fractures or dislocations based on my interpretation  DG Shoulder Left  Result Date: 08/10/2021 CLINICAL DATA:  Left shoulder pain EXAM: LEFT SHOULDER - 2+ VIEW COMPARISON:  None. FINDINGS: There is no evidence of fracture or dislocation. There is no evidence of arthropathy or other focal bone abnormality. Soft tissues are unremarkable. IMPRESSION: Negative. Electronically Signed   By: 08/12/2021 D.O.   On: 08/10/2021 21:57    PROCEDURES:  Critical Care performed: None.  Procedures    MEDICATIONS ORDERED IN ED: Medications - No data to display   IMPRESSION / MDM / ASSESSMENT AND PLAN / ED COURSE  I reviewed the triage vital signs and the nursing notes.                              [name] is a 36 y.o. female, history of type 2 diabetes, hypertension, GERD, rotator cuff  tendinitis, presents to the emergency department for evaluation of left shoulder pain.  Differential diagnosis includes, but is not limited to, rotator cuff injury, labrum tear, biceps tendinitis, impingement syndrome, adhesive capsulitis.  ED Course Patient appears well.  Vital signs within normal limits.  NAD  Left shoulder x-ray shows no evidence of acute fracture or dislocation.  No significant soft tissue swelling.  Assessment/Plan Presentation consistent with rotator cuff injury versus subacromial bursitis.  X-ray reassuring for rule out of fracture/dislocation.  Given her presentation, I have a very low suspicion for occult fracture requiring further imaging.  No evidence of neurovascular complications.  We will plan to discharge this patient with a referral to orthopedics.   Patient was provided with anticipatory  guidance, return precautions, and educational material. Encouraged the patient to return to the emergency department at any time if they begin to experience any new or worsening symptoms.       FINAL CLINICAL IMPRESSION(S) / ED DIAGNOSES   Final diagnoses:  Acute pain of left shoulder     Rx / DC Orders   ED Discharge Orders     None        Note:  This document was prepared using Dragon voice recognition software and may include unintentional dictation errors.   Varney Daily, [place] 08/11/21 0814    Arnaldo Natal, MD 08/12/21 [phone]

## 2021-08-10 NOTE — Discharge Instructions (Addendum)
-  Take Tylenol/ibuprofen as needed for pain. -Follow-up with the orthopedist listed above as needed. -Return to the emergency department anytime if you begin to experience any new or worsening symptoms.

## 2021-12-11 ENCOUNTER — Other Ambulatory Visit (HOSPITAL_COMMUNITY)
Admission: RE | Admit: 2021-12-11 | Discharge: 2021-12-11 | Disposition: A | Discharge status: Home / Self Care | Payer: BC Managed Care – PPO | Source: Ambulatory Visit | Attending: Advanced Practice Midwife | Admitting: Advanced Practice Midwife

## 2021-12-11 ENCOUNTER — Telehealth: Payer: Self-pay | Admitting: Obstetrics & Gynecology

## 2021-12-11 ENCOUNTER — Ambulatory Visit: Payer: BC Managed Care – PPO | Admitting: Family Medicine

## 2021-12-11 ENCOUNTER — Encounter: Payer: Self-pay | Admitting: Advanced Practice Midwife

## 2021-12-11 ENCOUNTER — Ambulatory Visit (INDEPENDENT_AMBULATORY_CARE_PROVIDER_SITE_OTHER): Payer: BC Managed Care – PPO | Admitting: Advanced Practice Midwife

## 2021-12-11 VITALS — BP 152/100 | Ht 67.0 in | Wt 229.0 lb

## 2021-12-11 DIAGNOSIS — R3915 Urgency of urination: Secondary | ICD-10-CM | POA: Diagnosis not present

## 2021-12-11 DIAGNOSIS — N898 Other specified noninflammatory disorders of vagina: Secondary | ICD-10-CM | POA: Diagnosis not present

## 2021-12-11 DIAGNOSIS — R102 Pelvic and perineal pain: Secondary | ICD-10-CM | POA: Insufficient documentation

## 2021-12-11 DIAGNOSIS — B3731 Acute candidiasis of vulva and vagina: Secondary | ICD-10-CM | POA: Insufficient documentation

## 2021-12-11 DIAGNOSIS — Z113 Encounter for screening for infections with a predominantly sexual mode of transmission: Secondary | ICD-10-CM | POA: Diagnosis not present

## 2021-12-11 LAB — POCT URINALYSIS DIPSTICK
Bilirubin, UA: NEGATIVE
Blood, UA: NEGATIVE
Glucose, UA: NEGATIVE
Ketones, UA: NEGATIVE
Leukocytes, UA: NEGATIVE
Nitrite, UA: POSITIVE
Protein, UA: NEGATIVE
Spec Grav, UA: 1.01 (ref 1.010–1.025)
Urobilinogen, UA: 0.2 E.U./dL
pH, UA: 5 (ref 5.0–8.0)

## 2021-12-11 MED ORDER — CEPHALEXIN 500 MG PO CAPS: 500.0000 mg | ORAL_CAPSULE | Freq: Four times a day (QID) | ORAL | 2 refills | Status: DC

## 2021-12-11 MED ORDER — FLUCONAZOLE 150 MG PO TABS: 150.0000 mg | ORAL_TABLET | Freq: Once | ORAL | 1 refills | Status: AC

## 2021-12-11 NOTE — Progress Notes (Signed)
Patient ID: [name], female   DOB: [birth year], 36 y.o.   MRN: [id number]  Reason for Consult: Dysuria and Pelvic [name] (discharge)    Subjective:  HPI:  [name] is a 36 y.o. female being seen for symptoms of UTI and possible vaginitis. Her symptoms started about 2 weeks ago as severe cramping during her period: LMP 11/24/2021, and persisting after her period. She also noted urgency/frequency. She denies burning with urination. She denies vaginal itching, irritation or odor. She does mention thick yellow discharge that began after her most recent period. She also requests STD testing as she had unprotected intercourse just prior to her period and symptoms starting.   Past Medical History:  Diagnosis Date   Diabetes mellitus without complication (HCC)    before gastric sleeve procedure   GERD (gastroesophageal reflux disease)    Headache    when BP is up   Hypertension    Wears contact lenses    Family History  Problem Relation Age of Onset   Diabetes Father    Hypertension Father    Hyperlipidemia Father    Hypertension Sister    Birth defects Sister        fluid on brain cause blindness in B eye   Breast cancer Maternal Aunt 30       has contact   Breast cancer Maternal Aunt 30       has contact   Breast cancer Cousin 30       has contact   Prostate cancer Paternal Grandfather    Past Surgical History:  Procedure Laterality Date   CESAREAN SECTION  04/30/2009   decreased fetal heart tones due to umbilical cord wrapped   CHOLECYSTECTOMY     ESOPHAGOGASTRODUODENOSCOPY (EGD) WITH PROPOFOL N/A 10/24/2015   Procedure: ESOPHAGOGASTRODUODENOSCOPY (EGD) WITH PROPOFOL;  Surgeon: Midge Minium, MD;  Location: [facility];  Service: Endoscopy;  Laterality: N/A;   LAPAROSCOPIC GASTRIC SLEEVE RESECTION N/A 03/19/2015   Procedure: LAPAROSCOPIC GASTRIC SLEEVE RESECTION;  Surgeon: Everette Rank, MD;  Location: [facility];  Service: General;  Laterality: N/A;     Short Social History:  Social History   Tobacco Use   Smoking status: Never   Smokeless tobacco: Never  Substance Use Topics   Alcohol use: No    Comment: 1 drink/mo    Allergies  Allergen Reactions   Tomato Rash    Current Outpatient Medications  Medication Sig Dispense Refill   cephALEXin (KEFLEX) 500 MG capsule Take 1 capsule (500 mg total) by mouth 4 (four) times daily. 28 capsule 2   etonogestrel (NEXPLANON) 68 MG IMPL implant 1 each by Subdermal route once.     fluconazole (DIFLUCAN) 150 MG tablet Take 1 tablet (150 mg total) by mouth once for 1 dose. Can take additional dose three days later if symptoms persist 1 tablet 1   omeprazole (PRILOSEC) 40 MG capsule TAKE 1 CAPSULE(40 MG) BY MOUTH DAILY 90 capsule 1   doxycycline (VIBRA-TABS) 100 MG tablet Take 1 tablet (100 mg total) by mouth 2 (two) times daily. (Patient not taking: Reported on 12/11/2021) 20 tablet 0   ferrous sulfate 324 MG TBEC Take 324 mg by mouth daily. (Patient not taking: Reported on 12/11/2021)     ibuprofen (ADVIL) 800 MG tablet Take 1 tablet (800 mg total) by mouth every 8 (eight) hours as needed. (Patient not taking: Reported on 12/11/2021) 30 tablet 0   Multiple Vitamins-Minerals (MULTIVITAMIN WOMEN PO) Take 1 tablet by mouth daily. (  Patient not taking: Reported on 12/11/2021)     No current facility-administered medications for this visit.    Review of Systems  Constitutional:  Negative for chills and fever.  HENT:  Negative for congestion, ear discharge, ear [name], hearing loss, sinus [name] and sore throat.   Eyes:  Negative for blurred vision and double vision.  Respiratory:  Negative for cough, shortness of breath and wheezing.   Cardiovascular:  Negative for chest [name], palpitations and leg swelling.  Gastrointestinal:  Positive for abdominal [name]. Negative for blood in stool, constipation, diarrhea, heartburn, melena, nausea and vomiting.  Genitourinary:  Positive for frequency and urgency.  Negative for dysuria, flank [name] and hematuria.       Positive for vaginal discharge  Musculoskeletal:  Negative for back [name], joint [name] and myalgias.  Skin:  Negative for itching and rash.  Neurological:  Negative for dizziness, tingling, tremors, sensory change, speech change, focal weakness, seizures, loss of consciousness, weakness and headaches.  Endo/Heme/Allergies:  Negative for environmental allergies. Does not bruise/bleed easily.  Psychiatric/Behavioral:  Negative for depression, hallucinations, memory loss, substance abuse and suicidal ideas. The patient is not nervous/anxious and does not have insomnia.         Objective:  Objective   Vitals:   12/11/21 1022  BP: (!) 152/100  Weight: 229 lb (103.9 kg)  Height: 5\' 7"  (1.702 m)   Body mass index is 35.87 kg/m. Constitutional: Well nourished, well developed female in no acute distress.  HEENT: normal Skin: Warm and dry.   Extremity:  no edema   Respiratory:  Normal respiratory effort Back: no CVAT Neuro: DTRs 2+, Cranial nerves grossly intact Psych: Alert and Oriented x3. No memory deficits. Normal mood and affect.    Pelvic exam:  is not limited by body habitus EGBUS: within normal limits Vagina: within normal limits and with normal mucosa, dry/no discharge  Data:  Latest Reference Range & Units 12/11/21 10:41  Bilirubin, UA  Negative  Glucose Negative  Negative  Ketones, UA  Negative  Leukocytes,UA Negative  Negative  Nitrite, UA  POSITIVE  pH, UA 5.0 - 8.0  5.0  Protein,UA Negative  Negative  Specific Gravity, UA 1.010 - 1.025  1.010  Urobilinogen, UA 0.2 or 1.0 E.U./dL 0.2  RBC, UA  Negative       Assessment/Plan:     35 y.o. G2 P74 female with likely UTI/possible vaginitis  Aptima: STD/vaginitis Urine culture Rx Keflex Rx Diflucan Follow up as needed after labs result   P0 CNM [facility] 12/11/2021, 2:51 PM

## 2021-12-11 NOTE — Telephone Encounter (Signed)
Noted. Will order to arrive by apt date/time. 

## 2021-12-11 NOTE — Telephone Encounter (Signed)
Pt is scheduled for Nexplanon removal and reinsertion with Dr. Marice Potter on 7/11.

## 2021-12-12 ENCOUNTER — Other Ambulatory Visit: Payer: Self-pay | Admitting: Advanced Practice Midwife

## 2021-12-12 DIAGNOSIS — B3731 Acute candidiasis of vulva and vagina: Secondary | ICD-10-CM

## 2021-12-12 LAB — CERVICOVAGINAL ANCILLARY ONLY
Bacterial Vaginitis (gardnerella): NEGATIVE
Candida Glabrata: NEGATIVE
Candida Vaginitis: POSITIVE — AB
Chlamydia: NEGATIVE
Comment: NEGATIVE
Comment: NEGATIVE
Comment: NEGATIVE
Comment: NEGATIVE
Comment: NEGATIVE
Comment: NORMAL
Neisseria Gonorrhea: NEGATIVE
Trichomonas: NEGATIVE

## 2021-12-12 MED ORDER — FLUCONAZOLE 150 MG PO TABS: 150.0000 mg | ORAL_TABLET | Freq: Once | ORAL | 3 refills | Status: AC

## 2021-12-12 NOTE — Progress Notes (Signed)
Rx diflucan to treat yeast. Message to patient.

## 2021-12-17 LAB — URINE CULTURE

## 2021-12-23 ENCOUNTER — Ambulatory Visit: Payer: BC Managed Care – PPO | Admitting: Family Medicine

## 2021-12-23 ENCOUNTER — Ambulatory Visit (INDEPENDENT_AMBULATORY_CARE_PROVIDER_SITE_OTHER): Payer: BC Managed Care – PPO | Admitting: Obstetrics & Gynecology

## 2021-12-23 ENCOUNTER — Encounter: Payer: Self-pay | Admitting: Obstetrics & Gynecology

## 2021-12-23 VITALS — BP 110/80 | Ht 67.0 in | Wt 234.0 lb

## 2021-12-23 DIAGNOSIS — Z30017 Encounter for initial prescription of implantable subdermal contraceptive: Secondary | ICD-10-CM

## 2021-12-23 DIAGNOSIS — Z3046 Encounter for surveillance of implantable subdermal contraceptive: Secondary | ICD-10-CM

## 2021-12-23 DIAGNOSIS — Z3202 Encounter for pregnancy test, result negative: Secondary | ICD-10-CM | POA: Diagnosis not present

## 2021-12-23 LAB — POCT URINE PREGNANCY: Preg Test, Ur: NEGATIVE

## 2021-12-23 MED ORDER — ETONOGESTREL 68 MG ~~LOC~~ IMPL
68.0000 mg | DRUG_IMPLANT | Freq: Once | SUBCUTANEOUS | Status: AC
  Administered 2021-12-23: 68 mg via SUBCUTANEOUS

## 2021-12-23 NOTE — Progress Notes (Signed)
   Subjective:    Patient ID: [name], female    DOB: 05/15/1986, 36 y.o.   MRN: [id number]  HPI 36 yo P1 here for removal of her 36 yo Nexplanon and replacement with a new one. She is happy with this method. This will be her fourth Nexplanon. She reports that she has some bleeding about once per month, about 3 days.   Review of Systems Her last pap was normal in 2020.    Objective:   Physical Exam  Well nourished, well hydrated African American female no apparent distress She is ambulating and conversing normally. Consent was signed and time out was done. Her left arm was prepped with betadine after establishing the position of the Nexplanon. The area was infiltrated with 2 cc of 1% lidocaine. A small incision was made and the intact rod was removed. There was quite of bit of scarring and this process took about 15 minutes.  I then placed a new Nexplanon according to industry standards after assuring that it was in the device. She tolerated the procedure well. A steristrip was placed and her arm was noted to be hemostatic. It was bandaged.  She tolerated the procedure well.        Assessment & Plan:   Contraception- Nexplanon Preventative care- schedule annual exam at her convenience

## 2021-12-25 ENCOUNTER — Encounter: Payer: Self-pay | Admitting: Obstetrics & Gynecology

## 2022-04-01 ENCOUNTER — Other Ambulatory Visit: Payer: Self-pay | Admitting: Family Medicine

## 2022-05-12 ENCOUNTER — Encounter: Payer: Self-pay | Admitting: Family Medicine

## 2022-05-12 ENCOUNTER — Ambulatory Visit (INDEPENDENT_AMBULATORY_CARE_PROVIDER_SITE_OTHER): Payer: BC Managed Care – PPO | Admitting: Family Medicine

## 2022-05-12 VITALS — BP 130/80 | HR 87 | Temp 99.0°F | Ht 65.75 in | Wt 231.0 lb

## 2022-05-12 DIAGNOSIS — Z0000 Encounter for general adult medical examination without abnormal findings: Secondary | ICD-10-CM

## 2022-05-12 DIAGNOSIS — E119 Type 2 diabetes mellitus without complications: Secondary | ICD-10-CM | POA: Diagnosis not present

## 2022-05-12 DIAGNOSIS — E6609 Other obesity due to excess calories: Secondary | ICD-10-CM | POA: Diagnosis not present

## 2022-05-12 DIAGNOSIS — Z6837 Body mass index (BMI) 37.0-37.9, adult: Secondary | ICD-10-CM

## 2022-05-12 LAB — HM DIABETES FOOT EXAM

## 2022-05-12 NOTE — Assessment & Plan Note (Signed)
Chronic, discussed weight loss options.  She has been on phentermine in the past without side effects but given her heart rate and blood pressure are borderline today this would not be an ideal choice.  She has been working aggressively on a healthy low-carb diet with portion size and meal prepping.  She has been exercising with a trainer 3 times a week for 45 minutes cardiovascular exercise. We discussed given her diabetes medications such as a GLP-1 would likely be a good option.  We will check her A1c and she will call her insurance for coverage.

## 2022-05-12 NOTE — Progress Notes (Signed)
Patient ID: [name], female    DOB: 1986-04-11, 36 y.o.   MRN: [id number]  This visit was conducted in person.  BP 130/80   Pulse 87   Temp 99 F (37.2 C) (Oral)   Ht 5' 5.75" (1.67 m)   Wt 231 lb (104.8 kg)   SpO2 98%   BMI 37.57 kg/m    CC:  Chief Complaint  Patient presents with   Annual Exam    Subjective:   HPI: [name] is a 36 y.o. female presenting on 05/12/2022 for Annual Exam  Diabetes:  Diet controlled. Due for re-eval. Using medications without difficulties: Hypoglycemic episodes: Hyperglycemic episodes: Feet problems: Blood Sugars averaging: none eye exam within last year:  Wt Readings from Last 3 Encounters:  05/12/22 231 lb (104.8 kg)  12/23/21 234 lb (106.1 kg)  12/11/21 229 lb (103.9 kg)  Body mass index is 37.57 kg/m.  Diet:  She has been working on healthy low carb diet, meal prep ing. Has been working aggressively ion since July.  Working with a Psychologist, educational.  She has been working on portion control. Exercise: regular exercise.. treadmill etc. 3 times a week 45 min.   She has been on phentermine in past.. no SE in past.    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  Relevant past medical, surgical, family and social history reviewed and updated as indicated. Interim medical history since our last visit reviewed. Allergies and medications reviewed and updated. Outpatient Medications Prior to Visit  Medication Sig Dispense Refill   etonogestrel (NEXPLANON) 68 MG IMPL implant 1 each by Subdermal route once.     Multiple Vitamins-Minerals (MULTIVITAMIN WOMEN PO) Take 1 tablet by mouth daily.     omeprazole (PRILOSEC) 40 MG capsule TAKE 1 CAPSULE(40 MG) BY MOUTH DAILY 90 capsule 0   cephALEXin (KEFLEX) 500 MG capsule Take 1 capsule (500 mg total) by mouth 4 (four) times daily. 28 capsule 2   No facility-administered medications prior to visit.     Per HPI unless specifically indicated in ROS section below Review of Systems   Constitutional:  Negative for fatigue and fever.  HENT:  Negative for congestion.   Eyes:  Negative for pain.  Respiratory:  Negative for cough and shortness of breath.   Cardiovascular:  Negative for chest pain, palpitations and leg swelling.  Gastrointestinal:  Negative for abdominal pain.  Genitourinary:  Negative for dysuria and vaginal bleeding.  Musculoskeletal:  Negative for back pain.  Neurological:  Negative for syncope, light-headedness and headaches.  Psychiatric/Behavioral:  Negative for dysphoric mood.    Objective:  BP 130/80   Pulse 87   Temp 99 F (37.2 C) (Oral)   Ht 5' 5.75" (1.67 m)   Wt 231 lb (104.8 kg)   SpO2 98%   BMI 37.57 kg/m   Wt Readings from Last 3 Encounters:  05/12/22 231 lb (104.8 kg)  12/23/21 234 lb (106.1 kg)  12/11/21 229 lb (103.9 kg)      Physical Exam Vitals and nursing note reviewed.  Constitutional:      General: She is not in acute distress.    Appearance: Normal appearance. She is well-developed. She is obese. She is not ill-appearing or toxic-appearing.  HENT:     Head: Normocephalic.     Right Ear: Hearing, tympanic membrane, ear canal and external ear normal.     Left Ear: Hearing, tympanic membrane, ear canal and external ear normal.     Nose: Nose normal.  Eyes:     General: Lids are normal. Lids are everted, no foreign bodies appreciated.     Conjunctiva/sclera: Conjunctivae normal.     Pupils: Pupils are equal, round, and reactive to light.  Neck:     Thyroid: No thyroid mass or thyromegaly.     Vascular: No carotid bruit.     Trachea: Trachea normal.  Cardiovascular:     Rate and Rhythm: Normal rate and regular rhythm.     Heart sounds: Normal heart sounds, S1 normal and S2 normal. No murmur heard.    No gallop.  Pulmonary:     Effort: Pulmonary effort is normal. No respiratory distress.     Breath sounds: Normal breath sounds. No wheezing, rhonchi or rales.  Abdominal:     General: Bowel sounds are normal.  There is no distension or abdominal bruit.     Palpations: Abdomen is soft. There is no fluid wave or mass.     Tenderness: There is no abdominal tenderness. There is no guarding or rebound.     Hernia: No hernia is present.  Musculoskeletal:     Cervical back: Normal range of motion and neck supple.  Lymphadenopathy:     Cervical: No cervical adenopathy.  Skin:    General: Skin is warm and dry.     Findings: No rash.  Neurological:     Mental Status: She is alert.     Cranial Nerves: No cranial nerve deficit.     Sensory: No sensory deficit.  Psychiatric:        Mood and Affect: Mood is not anxious or depressed.        Speech: Speech normal.        Behavior: Behavior normal. Behavior is cooperative.        Judgment: Judgment normal.       Diabetic foot exam: Normal inspection No skin breakdown No calluses  Normal DP pulses Normal sensation to light touch and monofilament Nails normal  Results for orders placed or performed in visit on 12/23/21  POCT urine pregnancy  Result Value Ref Range   Preg Test, Ur Negative Negative     COVID 19 screen:  No recent travel or known exposure to COVID19 The patient denies respiratory symptoms of COVID 19 at this time. The importance of social distancing was discussed today.   Assessment and Plan The patient's preventative maintenance and recommended screening tests for an annual wellness exam were reviewed in full today. Brought up to date unless services declined.  Counselled on the importance of diet, exercise, and its role in overall health and mortality. The patient's FH and SH was reviewed, including their home life, tobacco status, and drug and alcohol status.   Vaccines:Consider PNA 23 given DM, ( not interested given now prediabetes range) COVID x 3, flu due ( refused), uptodate with td,  Pap/DVE:  12/23/2021.. followed by Marilu Favre. Mammo:  not indicated, no early family history ( MGM with breast cancer).. did  have 03/2020 Colon:  not indicated, no early  first degree family history. Smoking Status:none ETOH/ drug use: rare/ none  Hep C: neg  HIV screen:  done   EYE scheduled Saturday.  Problem List Items Addressed This Visit     Class 2 obesity without serious comorbidity with body mass index (BMI) of 37.0 to 37.9 in adult    Chronic, discussed weight loss options.  She has been on phentermine in the past without side effects but given her heart rate and  blood pressure are borderline today this would not be an ideal choice.  She has been working aggressively on a healthy low-carb diet with portion size and meal prepping.  She has been exercising with a trainer 3 times a week for 45 minutes cardiovascular exercise. We discussed given her diabetes medications such as a GLP-1 would likely be a good option.  We will check her A1c and she will call her insurance for coverage.      Type II diabetes mellitus, well controlled (HCC)    Well-controlled with diet in past.. due for re-eval.  Due for yearly eye exam.. Encouraged exercise, weight loss, healthy eating habits.       Relevant Orders   Hemoglobin A1c   Lipid panel   Comprehensive metabolic panel   Microalbumin / creatinine urine ratio   Other Visit Diagnoses     Routine general medical examination at a health care facility    -  Primary       Kerby Nora, MD

## 2022-05-12 NOTE — Assessment & Plan Note (Addendum)
Well-controlled with diet in past.. due for re-eval.  Due for yearly eye exam.. Encouraged exercise, weight loss, healthy eating habits.

## 2022-05-12 NOTE — Patient Instructions (Addendum)
Keep eye exam on Saturday..  Call insurance to see if  Ozempic, Mounjaro and  Trulcity covered for diabetes.

## 2022-05-20 ENCOUNTER — Encounter: Payer: Self-pay | Admitting: Family Medicine

## 2022-05-20 ENCOUNTER — Other Ambulatory Visit (INDEPENDENT_AMBULATORY_CARE_PROVIDER_SITE_OTHER): Payer: BC Managed Care – PPO

## 2022-05-20 DIAGNOSIS — E119 Type 2 diabetes mellitus without complications: Secondary | ICD-10-CM | POA: Diagnosis not present

## 2022-05-20 LAB — LIPID PANEL
Cholesterol: 149 mg/dL (ref 0–200)
HDL: 46.3 mg/dL (ref >39.00)
LDL Cholesterol: 91 mg/dL (ref 0–99)
NonHDL: 103.17
Total CHOL/HDL Ratio: 3
Triglycerides: 62 mg/dL (ref 0.0–149.0)
VLDL: 12.4 mg/dL (ref 0.0–40.0)

## 2022-05-20 LAB — MICROALBUMIN / CREATININE URINE RATIO
Creatinine,U: 136.4 mg/dL
Microalb Creat Ratio: 0.6 mg/g (ref 0.0–30.0)
Microalb, Ur: 0.8 mg/dL (ref 0.0–1.9)

## 2022-05-20 LAB — COMPREHENSIVE METABOLIC PANEL
ALT: 14 U/L (ref 0–35)
AST: 9 U/L (ref 0–37)
Albumin: 4.3 g/dL (ref 3.5–5.2)
Alkaline Phosphatase: 52 U/L (ref 39–117)
BUN: 9 mg/dL (ref 6–23)
CO2: 28 mEq/L (ref 19–32)
Calcium: 9 mg/dL (ref 8.4–10.5)
Chloride: 106 mEq/L (ref 96–112)
Creatinine, Ser: 0.74 mg/dL (ref 0.40–1.20)
GFR: 105.8 mL/min (ref >60.00)
Glucose, Bld: 113 mg/dL — ABNORMAL HIGH (ref 70–99)
Potassium: 4.2 mEq/L (ref 3.5–5.1)
Sodium: 139 mEq/L (ref 135–145)
Total Bilirubin: 0.3 mg/dL (ref 0.2–1.2)
Total Protein: 7.1 g/dL (ref 6.0–8.3)

## 2022-05-20 LAB — HEMOGLOBIN A1C: Hgb A1c MFr Bld: 7.1 % — ABNORMAL HIGH (ref 4.6–6.5)

## 2022-05-22 MED ORDER — OZEMPIC (0.25 OR 0.5 MG/DOSE) 2 MG/3ML ~~LOC~~ SOPN: 0.2500 mg | PEN_INJECTOR | SUBCUTANEOUS | 11 refills | Status: DC

## 2022-05-25 ENCOUNTER — Other Ambulatory Visit (HOSPITAL_COMMUNITY): Payer: Self-pay

## 2022-05-25 ENCOUNTER — Telehealth: Payer: Self-pay

## 2022-05-25 NOTE — Telephone Encounter (Signed)
Received notification from [facility] [place] regarding a prior authorization for Ozempic 2mg /57ml.  Authorization has been APPROVED from 05/25/22 to 05/24/23.   Placed a call to 14/9/24 and spoke with the pharmacist. Pharmacy processed the claim and the copay for 28 days supply is $24.99.  Key: [employer]

## 2022-05-25 NOTE — Telephone Encounter (Signed)
Noted  

## 2022-05-25 NOTE — Telephone Encounter (Signed)
Pharmacy Patient Advocate Encounter   Received notification from [facility] that prior authorization for Ozempic (0.25 or 0.5 MG/DOSE) 2MG /3ML pen-injectors is required/requested.   PA submitted on 05/25/22 to [facility] via CoverMyMeds Key [id number] Status is pending

## 2022-05-26 ENCOUNTER — Other Ambulatory Visit (HOSPITAL_COMMUNITY): Payer: Self-pay

## 2022-07-15 LAB — HM DIABETES EYE EXAM

## 2022-09-07 ENCOUNTER — Encounter: Payer: Self-pay | Admitting: Family Medicine

## 2022-09-07 ENCOUNTER — Encounter: Payer: Self-pay | Admitting: Advanced Practice Midwife

## 2022-10-09 ENCOUNTER — Ambulatory Visit: Payer: BC Managed Care – PPO | Admitting: Family Medicine

## 2022-10-23 ENCOUNTER — Ambulatory Visit: Payer: BC Managed Care – PPO | Admitting: Family Medicine

## 2022-10-29 ENCOUNTER — Ambulatory Visit: Payer: BC Managed Care – PPO | Admitting: Family Medicine

## 2022-10-30 ENCOUNTER — Encounter: Payer: Self-pay | Admitting: Family Medicine

## 2022-10-30 ENCOUNTER — Ambulatory Visit (INDEPENDENT_AMBULATORY_CARE_PROVIDER_SITE_OTHER): Payer: BC Managed Care – PPO | Admitting: Family Medicine

## 2022-10-30 VITALS — BP 118/80 | HR 75 | Temp 97.1°F | Ht 65.75 in | Wt 221.1 lb

## 2022-10-30 DIAGNOSIS — L0292 Furuncle, unspecified: Secondary | ICD-10-CM

## 2022-10-30 DIAGNOSIS — Z7985 Long-term (current) use of injectable non-insulin antidiabetic drugs: Secondary | ICD-10-CM

## 2022-10-30 DIAGNOSIS — E119 Type 2 diabetes mellitus without complications: Secondary | ICD-10-CM | POA: Diagnosis not present

## 2022-10-30 DIAGNOSIS — R1013 Epigastric pain: Secondary | ICD-10-CM | POA: Diagnosis not present

## 2022-10-30 LAB — POCT GLYCOSYLATED HEMOGLOBIN (HGB A1C): Hemoglobin A1C: 5.8 % — AB (ref 4.0–5.6)

## 2022-10-30 NOTE — Assessment & Plan Note (Signed)
Acute  We discussed possible increasing dose of Ozempic but she does have some associated stomach upset after taking it.  There is no clear sign of concern for pancreatitis.  Normal abdominal exam today. Her symptoms sound more related to reflux and possible additional side effect to using ibuprofen with history of gastric bypass. Recommended for her to avoid NSAIDs, start trial of Pepcid AC twice daily on the days she takes Ozempic as well as possibly the day after.  She will also continue omeprazole 40 mg p.o. daily.  If her symptoms continue we will move forward with lipase to rule out pancreatitis.

## 2022-10-30 NOTE — Patient Instructions (Signed)
Try to use tylenol instead for  menstrual pain.  Can use Pepcid AC twice daily day on injection of semaglutide and possible then day to see if helps with side effect.  Call if not improving for possible labs to check lipase/pancrease issue.

## 2022-10-30 NOTE — Assessment & Plan Note (Addendum)
Chronic, significant improvement in A1c with initiation of Ozempic.  10 pound weight loss.  Encouraged patient to continue working on dietary changes and exercise regimen as she is.  We discussed possible increasing dose of Ozempic but she does have some associated stomach upset after taking it.   See discussion on epigastric abdominal pain.

## 2022-10-30 NOTE — Progress Notes (Signed)
Patient ID: [name], female    DOB: 08-29-1985, 37 y.o.   MRN: [id number]  This visit was conducted in person.  BP 118/80 (BP Location: Left Arm, Patient Position: Sitting, Cuff Size: Large)   Pulse 75   Temp (!) 97.1 F (36.2 C) (Temporal)   Ht 5' 5.75" (1.67 m)   Wt 221 lb 2 oz (100.3 kg)   SpO2 99%   BMI 35.96 kg/m    CC:  Chief Complaint  Patient presents with   Diabetes    Follow up Ozempic     Subjective:   HPI: [name] is a 37 y.o. female presenting on 10/30/2022 for Diabetes (Follow up Ozempic )   She has been noting intermittent pustule on mons pubis, has had on leg and buttock in past. She treats with antibiotic ointment  No shaving. No current lesions.   Diabetes:   Now on ozempic 0.25 mg weekly.. she has noted some burning in stomach  for a few hours after  Has treated with ibuprofen. Lab Results  Component Value Date   HGBA1C 5.8 (A) 10/30/2022  Using medications without difficulties: Hypoglycemic episodes: Hyperglycemic episodes: Feet problems: Blood Sugars averaging: none eye exam within last year:  Already on omeprazole 40 mg daily.   She has noted significant decreased appetite. Wt Readings from Last 3 Encounters:  10/30/22 221 lb 2 oz (100.3 kg)  05/12/22 231 lb (104.8 kg)  12/23/21 234 lb (106.1 kg)  Body mass index is 35.96 kg/m.  Diet:  Working with a Psychologist, educational.  She has been working on portion control. Exercise: regular exercise.Marland Kitchen  going to [employer] three times a week.   She has been on phentermine in past.. no SE in past.    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   Relevant past medical, surgical, family and social history reviewed and updated as indicated. Interim medical history since our last visit reviewed. Allergies and medications reviewed and updated. Outpatient Medications Prior to Visit  Medication Sig Dispense Refill   etonogestrel (NEXPLANON) 68 MG IMPL implant 1 each by Subdermal route once.      Multiple Vitamins-Minerals (MULTIVITAMIN WOMEN PO) Take 1 tablet by mouth daily.     omeprazole (PRILOSEC) 40 MG capsule TAKE 1 CAPSULE(40 MG) BY MOUTH DAILY 90 capsule 0   Semaglutide,0.25 or 0.5MG /DOS, (OZEMPIC, 0.25 OR 0.5 MG/DOSE,) 2 MG/3ML SOPN Inject 0.25 mg into the skin once a week. 3 mL 11   No facility-administered medications prior to visit.     Per HPI unless specifically indicated in ROS section below Review of Systems  Constitutional:  Negative for fatigue and fever.  HENT:  Negative for congestion.   Eyes:  Negative for pain.  Respiratory:  Negative for cough and shortness of breath.   Cardiovascular:  Negative for chest pain, palpitations and leg swelling.  Gastrointestinal:  Negative for abdominal pain.  Genitourinary:  Negative for dysuria and vaginal bleeding.  Musculoskeletal:  Negative for back pain.  Neurological:  Negative for syncope, light-headedness and headaches.  Psychiatric/Behavioral:  Negative for dysphoric mood.    Objective:  BP 118/80 (BP Location: Left Arm, Patient Position: Sitting, Cuff Size: Large)   Pulse 75   Temp (!) 97.1 F (36.2 C) (Temporal)   Ht 5' 5.75" (1.67 m)   Wt 221 lb 2 oz (100.3 kg)   SpO2 99%   BMI 35.96 kg/m   Wt Readings from Last 3 Encounters:  10/30/22 221 lb 2 oz (100.3 kg)  05/12/22 231 lb (104.8 kg)  12/23/21 234 lb (106.1 kg)      Physical Exam Vitals and nursing note reviewed.  Constitutional:      General: She is not in acute distress.    Appearance: Normal appearance. She is well-developed. She is obese. She is not ill-appearing or toxic-appearing.  HENT:     Head: Normocephalic.     Right Ear: Hearing, tympanic membrane, ear canal and external ear normal.     Left Ear: Hearing, tympanic membrane, ear canal and external ear normal.     Nose: Nose normal.  Eyes:     General: Lids are normal. Lids are everted, no foreign bodies appreciated.     Conjunctiva/sclera: Conjunctivae normal.     Pupils: Pupils  are equal, round, and reactive to light.  Neck:     Thyroid: No thyroid mass or thyromegaly.     Vascular: No carotid bruit.     Trachea: Trachea normal.  Cardiovascular:     Rate and Rhythm: Normal rate and regular rhythm.     Heart sounds: Normal heart sounds, S1 normal and S2 normal. No murmur heard.    No gallop.  Pulmonary:     Effort: Pulmonary effort is normal. No respiratory distress.     Breath sounds: Normal breath sounds. No wheezing, rhonchi or rales.  Abdominal:     General: Bowel sounds are normal. There is no distension or abdominal bruit.     Palpations: Abdomen is soft. There is no fluid wave or mass.     Tenderness: There is no abdominal tenderness. There is no guarding or rebound.     Hernia: No hernia is present.  Musculoskeletal:     Cervical back: Normal range of motion and neck supple.  Lymphadenopathy:     Cervical: No cervical adenopathy.  Skin:    General: Skin is warm and dry.     Findings: No rash.  Neurological:     Mental Status: She is alert.     Cranial Nerves: No cranial nerve deficit.     Sensory: No sensory deficit.  Psychiatric:        Mood and Affect: Mood is not anxious or depressed.        Speech: Speech normal.        Behavior: Behavior normal. Behavior is cooperative.        Judgment: Judgment normal.       Diabetic foot exam: Normal inspection No skin breakdown No calluses  Normal DP pulses Normal sensation to light touch and monofilament Nails normal  Results for orders placed or performed in visit on 10/30/22  POCT glycosylated hemoglobin (Hb A1C)  Result Value Ref Range   Hemoglobin A1C 5.8 (A) 4.0 - 5.6 %   HbA1c POC (<> result, manual entry)     HbA1c, POC (prediabetic range)     HbA1c, POC (controlled diabetic range)       COVID 19 screen:  No recent travel or known exposure to COVID19 The patient denies respiratory symptoms of COVID 19 at this time. The importance of social distancing was discussed today.    Assessment and Plan The patient's preventative maintenance and recommended screening tests for an annual wellness exam were reviewed in full today. Brought up to date unless services declined.  Counselled on the importance of diet, exercise, and its role in overall health and mortality. The patient's FH and SH was reviewed, including their home life, tobacco status, and drug and alcohol status.  Vaccines:Consider PNA 23 given DM, ( not interested given now prediabetes range) COVID x 3, flu due ( refused), uptodate with td,  Pap/DVE:  12/23/2021.. followed by Marilu Favre. Mammo:  not indicated, no early family history ( MGM with breast cancer).. did have 03/2020 Colon:  not indicated, no early  first degree family history. Smoking Status:none ETOH/ drug use: rare/ none  Hep C: neg  HIV screen:  done   EYE scheduled Saturday.  Problem List Items Addressed This Visit     Epigastric abdominal pain    Acute  We discussed possible increasing dose of Ozempic but she does have some associated stomach upset after taking it.  There is no clear sign of concern for pancreatitis.  Normal abdominal exam today. Her symptoms sound more related to reflux and possible additional side effect to using ibuprofen with history of gastric bypass. Recommended for her to avoid NSAIDs, start trial of Pepcid AC twice daily on the days she takes Ozempic as well as possibly the day after.  She will also continue omeprazole 40 mg p.o. daily.  If her symptoms continue we will move forward with lipase to rule out pancreatitis.      Recurrent boils   Type II diabetes mellitus, well controlled (HCC) - Primary    Chronic, significant improvement in A1c with initiation of Ozempic.  10 pound weight loss.  Encouraged patient to continue working on dietary changes and exercise regimen as she is.  We discussed possible increasing dose of Ozempic but she does have some associated stomach upset after taking it.    See discussion on epigastric abdominal pain.        Relevant Orders   POCT glycosylated hemoglobin (Hb A1C) (Completed)    Kerby Nora, MD

## 2023-01-01 ENCOUNTER — Other Ambulatory Visit: Payer: Self-pay | Admitting: Advanced Practice Midwife

## 2023-01-01 DIAGNOSIS — N898 Other specified noninflammatory disorders of vagina: Secondary | ICD-10-CM

## 2023-01-11 ENCOUNTER — Other Ambulatory Visit: Payer: Self-pay

## 2023-01-11 ENCOUNTER — Emergency Department
Admission: EM | Admit: 2023-01-11 | Discharge: 2023-01-12 | Disposition: A | Discharge status: Home / Self Care | Payer: BC Managed Care – PPO | Attending: Emergency Medicine | Admitting: Emergency Medicine

## 2023-01-11 ENCOUNTER — Emergency Department: Payer: BC Managed Care – PPO

## 2023-01-11 ENCOUNTER — Encounter: Payer: Self-pay | Admitting: Emergency Medicine

## 2023-01-11 DIAGNOSIS — M25512 Pain in left shoulder: Secondary | ICD-10-CM | POA: Diagnosis not present

## 2023-01-11 NOTE — ED Triage Notes (Signed)
Pt presents ambulatory to triage via POV with complaints of L shoulder pain x 2 days. Pt notes waking up Saturday with pain in the shoulder that radiates down to her elbow today. She has taken both Tylenol and Ibuprofen without any improvement in sx. Pt has limited ROM due to pain but has equal grip strength. A&Ox4 at this time. Denies falls, injury, CP or SOB.

## 2023-01-12 NOTE — ED Provider Notes (Signed)
[facility] Provider Note    Event Date/Time   First MD Initiated Contact with Patient 01/11/23 2313     (approximate)   History   Shoulder Pain   HPI [name] is a 37 y.o. female who presents for evaluation of pain in her left shoulder and upper arm.  She reports that she felt normal about 2 days ago and that she went to sleep and when she woke up she had the severe pain that makes it difficult for her to move or use her arm.  She has no weakness necessarily but moving it makes it hurt.  No numbness or tingling distally.  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  No recent falls.  No neck pain.  No other limbs are involved.  No history of similar issues.     Physical Exam   Triage Vital Signs: ED Triage Vitals  Encounter Vitals Group     BP 01/11/23 2250 (!) 137/94     Systolic BP Percentile --      Diastolic BP Percentile --      Pulse Rate 01/11/23 2250 81     Resp 01/11/23 2250 18     Temp 01/11/23 2250 98.2 F (36.8 C)     Temp Source 01/11/23 2250 Oral     SpO2 01/11/23 2250 100 %     Weight 01/11/23 2249 96.6 kg (213 lb)     Height 01/11/23 2249 1.676 m (5\' 6" )     Head Circumference --      Peak Flow --      Pain Score 01/11/23 2252 10     Pain Loc --      Pain Education --      Exclude from Growth Chart --     Most recent vital signs: Vitals:   01/11/23 2250  BP: (!) 137/94  Pulse: 81  Resp: 18  Temp: 98.2 F (36.8 C)  SpO2: 100%    General: Awake, no distress.  CV:  Good peripheral perfusion.  Easily palpable left radial pulse. Resp:  Normal effort. Speaking easily and comfortably, no accessory muscle usage nor intercostal retractions.   Abd:  No distention.  MSK:  Normal appearance of left upper extremity with no gross abnormalities or evidence of injury.  Patient reports tenderness to palpation and manipulation throughout the left shoulder with passive range of motion and some  tenderness to palpation of the left upper arm without any palpable deformities.  Normal flexion extension of the elbow.  She has normal grip strength and is able to flex and extend her biceps and triceps respectively without any obvious pain or difficulty.   ED Results / Procedures / Treatments   Labs (all labs ordered are listed, but only abnormal results are displayed) Labs Reviewed - No data to display    RADIOLOGY I viewed and interpreted the patient's left shoulder x-rays and they are normal in appearance with no gross bony deformity or abnormality.  I also read the radiologist's report, which confirmed no acute findings.   PROCEDURES:  Critical Care performed: No  Procedures    IMPRESSION / MDM / ASSESSMENT AND PLAN / ED COURSE  I reviewed the triage vital signs and the nursing notes.                              Differential diagnosis includes, but is not  limited to, radiculopathy/"palsy" (nerve compression), muscular or ligamentous strain at work, less likely fracture or dislocation.  Patient's presentation is most consistent with acute complicated illness / injury requiring diagnostic workup.  Labs/studies ordered: Left shoulder x-rays  Interventions/Medications given:  Medications - No data to display  (Note:  hospital course my include additional interventions and/or labs/studies not listed above.)   No evidence of vascular compromise, reassuring x-rays.  Suspect muscular strain through the course of her job, but nerve compression while she is asleep also makes sense although this seems to be primarily sensory (pain) rather than a motor compression as well as 1 might expect from a radial nerve neuropathy, for example.  Regardless, I talked with her about limited use, NSAIDs, and orthopedics follow-up.  She understands and agrees with the plan.         FINAL CLINICAL IMPRESSION(S) / ED DIAGNOSES   Final diagnoses:  Acute pain of left shoulder     Rx  / DC Orders   ED Discharge Orders     None        Note:  This document was prepared using Dragon voice recognition software and may include unintentional dictation errors.   Loleta Rose, MD 01/12/23 [phone]

## 2023-01-12 NOTE — Discharge Instructions (Signed)
You likely either strained the muscles in your shoulder at work and did not realize it until after the fact, or you compress the nerve during your sleep.  Either way, the symptoms should improve gradually over the next few days.  Try to minimize the use of your shoulder is much as you can.  We recommend that you call the office of Dr. Joice Lofts to schedule a follow-up appointment with him or one of his colleagues in the orthopedics clinic.  Take over-the-counter ibuprofen 600 mg 3 times a day with meals to help with pain and inflammation.

## 2023-05-24 ENCOUNTER — Ambulatory Visit (INDEPENDENT_AMBULATORY_CARE_PROVIDER_SITE_OTHER): Payer: BC Managed Care – PPO | Admitting: Certified Nurse Midwife

## 2023-05-24 ENCOUNTER — Other Ambulatory Visit (HOSPITAL_COMMUNITY)
Admission: RE | Admit: 2023-05-24 | Discharge: 2023-05-24 | Disposition: A | Discharge status: Home / Self Care | Payer: BC Managed Care – PPO | Source: Ambulatory Visit | Attending: Certified Nurse Midwife | Admitting: Certified Nurse Midwife

## 2023-05-24 ENCOUNTER — Encounter: Payer: Self-pay | Admitting: Certified Nurse Midwife

## 2023-05-24 VITALS — BP 157/110 | HR 78 | Ht 66.0 in | Wt 205.0 lb

## 2023-05-24 DIAGNOSIS — B9689 Other specified bacterial agents as the cause of diseases classified elsewhere: Secondary | ICD-10-CM | POA: Diagnosis not present

## 2023-05-24 DIAGNOSIS — Z3046 Encounter for surveillance of implantable subdermal contraceptive: Secondary | ICD-10-CM

## 2023-05-24 DIAGNOSIS — A5602 Chlamydial vulvovaginitis: Secondary | ICD-10-CM | POA: Insufficient documentation

## 2023-05-24 DIAGNOSIS — Z124 Encounter for screening for malignant neoplasm of cervix: Secondary | ICD-10-CM | POA: Insufficient documentation

## 2023-05-24 DIAGNOSIS — Z113 Encounter for screening for infections with a predominantly sexual mode of transmission: Secondary | ICD-10-CM | POA: Diagnosis not present

## 2023-05-24 DIAGNOSIS — Z01419 Encounter for gynecological examination (general) (routine) without abnormal findings: Secondary | ICD-10-CM | POA: Insufficient documentation

## 2023-05-24 DIAGNOSIS — N760 Acute vaginitis: Secondary | ICD-10-CM | POA: Diagnosis not present

## 2023-05-24 DIAGNOSIS — Z1151 Encounter for screening for human papillomavirus (HPV): Secondary | ICD-10-CM

## 2023-05-24 DIAGNOSIS — Z114 Encounter for screening for human immunodeficiency virus [HIV]: Secondary | ICD-10-CM | POA: Diagnosis not present

## 2023-05-24 NOTE — Progress Notes (Signed)
ANNUAL EXAM Patient name: KRYSTALLE HANDRICK MRN [id number]  Date of birth: 1986-04-10 Chief Complaint:   Gynecologic Exam  History of Present Illness:   ILISE ALSTROM is a 37 y.o. G31P1011 African-American female being seen today for a routine annual exam.  Current complaints: occasional odor & increased discharge after IC, occasional crampy pain day after IC, resolves with ibuprofen. Nexplanon in place, placed 2023. Continues on Ozempic with improved control of diabetes.  No LMP recorded. Patient has had an implant.   The pregnancy intention screening data noted above was reviewed. Potential methods of contraception were discussed. The patient elected to proceed with Hormonal Implant.      Component Value Date/Time   DIAGPAP  12/01/2018 0000    NEGATIVE FOR INTRAEPITHELIAL LESIONS OR MALIGNANCY.   DIAGPAP  12/01/2018 0000    FUNGAL ORGANISMS PRESENT CONSISTENT WITH CANDIDA SPP.   ADEQPAP  12/01/2018 0000    Satisfactory for evaluation  endocervical/transformation zone component ABSENT.       Last pap 2020. Results were: NILM w/ HRHPV negative. H/O abnormal pap: no Last mammogram: 2020. Results were: normal. Family h/o breast cancer: yes maternal first cousin Last colonoscopy: n/a. Results were: N/A. Family h/o colorectal cancer: no     05/24/2023    6:02 PM 05/12/2022    3:32 PM 04/04/2021   11:09 AM 10/31/2014    8:08 AM  Depression screen PHQ 2/9  Decreased Interest 0 0 0 0  Down, Depressed, Hopeless 1 0 0 0  PHQ - 2 Score 1 0 0 0         No data to display           Review of Systems:   Pertinent items are noted in HPI Denies any headaches, blurred vision, fatigue, shortness of breath, chest pain, abdominal pain, abnormal vaginal discharge/itching/odor/irritation, problems with periods, bowel movements, urination, or intercourse unless otherwise stated above. Pertinent History Reviewed:  Reviewed past medical,surgical, social and family history.  Reviewed  problem list, medications and allergies. Physical Assessment:   Vitals:   05/24/23 1331 05/24/23 1421  BP: (!) 148/107 (!) 157/110  Pulse: 77 78  Weight: 205 lb (93 kg)   Height: 5\' 6"  (1.676 m)   Body mass index is 33.09 kg/m.       Physical Exam Vitals reviewed.  Constitutional:      Appearance: Normal appearance.  HENT:     Head: Normocephalic.  Neck:     Thyroid: No thyroid mass or thyromegaly.  Cardiovascular:     Rate and Rhythm: Normal rate and regular rhythm.     Heart sounds: Normal heart sounds.  Pulmonary:     Effort: Pulmonary effort is normal.     Breath sounds: Normal breath sounds.  Chest:  Breasts:    Tanner Score is 5.     Right: Normal.     Left: Normal.  Abdominal:     General: Abdomen is flat.     Palpations: Abdomen is soft.     Tenderness: There is no abdominal tenderness.  Genitourinary:    General: Normal vulva.     Vagina: Normal.     Cervix: Normal.     Uterus: Normal.      Comments: Cervix anterior, uterus anteflexed Musculoskeletal:     Cervical back: Neck supple. No tenderness.  Skin:    General: Skin is warm and dry.  Neurological:     General: No focal deficit present.     Mental Status: She  is alert and oriented to person, place, and time.  Psychiatric:        Mood and Affect: Mood normal.        Behavior: Behavior normal.      No results found for this or any previous visit (from the past 24 hour(s)).  Assessment & Plan:  1. Well woman exam with routine gynecological exam - Cytology - PAP - RPR - HIV Antibody (routine testing w rflx)  2. Screening for STD (sexually transmitted disease) - Cervicovaginal ancillary only - Cytology - PAP - RPR  3. Cervical cancer screening - Cytology - PAP  4. Encounter for screening for human papillomavirus (HPV) - Cytology - PAP  5. Implantable subdermal contraceptive surveillance  6. Screening for human immunodeficiency virus - HIV Antibody (routine testing w rflx) Reviewed  boric acid for BV prevention, positional discomfort with IC likely based on description. STI screening collected today.  Mammogram: @ 35yo, or sooner if problems Colonoscopy: @ 35yo, or sooner if problems  Orders Placed This Encounter  Procedures   RPR   HIV Antibody (routine testing w rflx)    Meds: No orders of the defined types were placed in this encounter.   Follow-up: Return in 1 year (on 05/23/2024) for Annual exam.  Dominica Severin, CNM 05/24/2023 6:03 PM

## 2023-05-24 NOTE — Patient Instructions (Signed)

## 2023-05-25 LAB — HIV ANTIBODY (ROUTINE TESTING W REFLEX): HIV Screen 4th Generation wRfx: NONREACTIVE

## 2023-05-25 LAB — RPR: RPR Ser Ql: NONREACTIVE

## 2023-05-26 ENCOUNTER — Emergency Department
Admission: EM | Admit: 2023-05-26 | Discharge: 2023-05-26 | Discharge status: Against Medical Advice | Payer: BC Managed Care – PPO | Attending: Emergency Medicine | Admitting: Emergency Medicine

## 2023-05-26 ENCOUNTER — Other Ambulatory Visit: Payer: Self-pay

## 2023-05-26 DIAGNOSIS — R1013 Epigastric pain: Secondary | ICD-10-CM | POA: Diagnosis not present

## 2023-05-26 DIAGNOSIS — E860 Dehydration: Secondary | ICD-10-CM | POA: Diagnosis not present

## 2023-05-26 DIAGNOSIS — E119 Type 2 diabetes mellitus without complications: Secondary | ICD-10-CM | POA: Diagnosis not present

## 2023-05-26 DIAGNOSIS — Z5321 Procedure and treatment not carried out due to patient leaving prior to being seen by health care provider: Secondary | ICD-10-CM | POA: Insufficient documentation

## 2023-05-26 DIAGNOSIS — K449 Diaphragmatic hernia without obstruction or gangrene: Secondary | ICD-10-CM | POA: Diagnosis not present

## 2023-05-26 DIAGNOSIS — I10 Essential (primary) hypertension: Secondary | ICD-10-CM | POA: Diagnosis not present

## 2023-05-26 DIAGNOSIS — R1010 Upper abdominal pain, unspecified: Secondary | ICD-10-CM | POA: Diagnosis not present

## 2023-05-26 DIAGNOSIS — R1011 Right upper quadrant pain: Secondary | ICD-10-CM | POA: Diagnosis not present

## 2023-05-26 DIAGNOSIS — N3000 Acute cystitis without hematuria: Secondary | ICD-10-CM | POA: Diagnosis not present

## 2023-05-26 DIAGNOSIS — R1012 Left upper quadrant pain: Secondary | ICD-10-CM | POA: Diagnosis not present

## 2023-05-26 DIAGNOSIS — Z79899 Other long term (current) drug therapy: Secondary | ICD-10-CM | POA: Diagnosis not present

## 2023-05-26 DIAGNOSIS — R197 Diarrhea, unspecified: Secondary | ICD-10-CM | POA: Diagnosis not present

## 2023-05-26 DIAGNOSIS — Z794 Long term (current) use of insulin: Secondary | ICD-10-CM | POA: Diagnosis not present

## 2023-05-26 DIAGNOSIS — R110 Nausea: Secondary | ICD-10-CM | POA: Diagnosis not present

## 2023-05-26 DIAGNOSIS — R1031 Right lower quadrant pain: Secondary | ICD-10-CM | POA: Diagnosis not present

## 2023-05-26 DIAGNOSIS — R112 Nausea with vomiting, unspecified: Secondary | ICD-10-CM | POA: Insufficient documentation

## 2023-05-26 DIAGNOSIS — R109 Unspecified abdominal pain: Secondary | ICD-10-CM | POA: Diagnosis not present

## 2023-05-26 LAB — CERVICOVAGINAL ANCILLARY ONLY
Bacterial Vaginitis (gardnerella): POSITIVE — AB
Candida Glabrata: NEGATIVE
Candida Vaginitis: NEGATIVE
Chlamydia: POSITIVE — AB
Comment: NEGATIVE
Comment: NEGATIVE
Comment: NEGATIVE
Comment: NEGATIVE
Comment: NEGATIVE
Comment: NORMAL
Neisseria Gonorrhea: NEGATIVE
Trichomonas: NEGATIVE

## 2023-05-26 LAB — COMPREHENSIVE METABOLIC PANEL
ALT: 16 U/L (ref 0–44)
AST: 16 U/L (ref 15–41)
Albumin: 4.1 g/dL (ref 3.5–5.0)
Alkaline Phosphatase: 47 U/L (ref 38–126)
Anion gap: 9 (ref 5–15)
BUN: 7 mg/dL (ref 6–20)
CO2: 25 mmol/L (ref 22–32)
Calcium: 9.2 mg/dL (ref 8.9–10.3)
Chloride: 103 mmol/L (ref 98–111)
Creatinine, Ser: 0.79 mg/dL (ref 0.44–1.00)
GFR, Estimated: >60 mL/min (ref >60)
Glucose, Bld: 120 mg/dL — ABNORMAL HIGH (ref 70–99)
Potassium: 3.4 mmol/L — ABNORMAL LOW (ref 3.5–5.1)
Sodium: 137 mmol/L (ref 135–145)
Total Bilirubin: 0.6 mg/dL (ref <1.2)
Total Protein: 7.7 g/dL (ref 6.5–8.1)

## 2023-05-26 LAB — URINALYSIS, ROUTINE W REFLEX MICROSCOPIC
Bilirubin Urine: NEGATIVE
Glucose, UA: NEGATIVE mg/dL
Hgb urine dipstick: NEGATIVE
Ketones, ur: 20 mg/dL — AB
Leukocytes,Ua: NEGATIVE
Nitrite: NEGATIVE
Protein, ur: NEGATIVE mg/dL
Specific Gravity, Urine: 1.013 (ref 1.005–1.030)
pH: 6 (ref 5.0–8.0)

## 2023-05-26 LAB — CBC
HCT: 38.9 % (ref 36.0–46.0)
Hemoglobin: 12.8 g/dL (ref 12.0–15.0)
MCH: 25.3 pg — ABNORMAL LOW (ref 26.0–34.0)
MCHC: 32.9 g/dL (ref 30.0–36.0)
MCV: 76.9 fL — ABNORMAL LOW (ref 80.0–100.0)
Platelets: 325 10*3/uL (ref 150–400)
RBC: 5.06 MIL/uL (ref 3.87–5.11)
RDW: 15.2 % (ref 11.5–15.5)
WBC: 9 10*3/uL (ref 4.0–10.5)
nRBC: 0 % (ref 0.0–0.2)

## 2023-05-26 LAB — LIPASE, BLOOD: Lipase: 23 U/L (ref 11–51)

## 2023-05-26 LAB — POC URINE PREG, ED: Preg Test, Ur: NEGATIVE

## 2023-05-26 NOTE — ED Notes (Signed)
Bystander reporting to this RN that this pt looks like she is going to pass out. This RN and Engineer, water found pt standing beside vending machine. Pt reports the pain has gotten worse but stating her sister is pulling the car around because of the long wait time. This RN offering pt wheelchair and encouraged pt to be seen. Pt stating she can walk and is leaving.

## 2023-05-26 NOTE — ED Triage Notes (Signed)
Patient reports upper abdominal pain, N/V/D that started two days ago

## 2023-05-27 ENCOUNTER — Other Ambulatory Visit: Payer: Self-pay | Admitting: Certified Nurse Midwife

## 2023-05-27 DIAGNOSIS — R1011 Right upper quadrant pain: Secondary | ICD-10-CM | POA: Diagnosis not present

## 2023-05-27 DIAGNOSIS — R1031 Right lower quadrant pain: Secondary | ICD-10-CM | POA: Diagnosis not present

## 2023-05-27 DIAGNOSIS — K449 Diaphragmatic hernia without obstruction or gangrene: Secondary | ICD-10-CM | POA: Diagnosis not present

## 2023-05-27 LAB — CYTOLOGY - PAP
Adequacy: ABSENT
Comment: NEGATIVE
Diagnosis: NEGATIVE
Diagnosis: REACTIVE
High risk HPV: NEGATIVE

## 2023-05-27 MED ORDER — AZITHROMYCIN 500 MG PO TABS: 1000.0000 mg | ORAL_TABLET | Freq: Once | ORAL | 0 refills | Status: AC

## 2023-05-27 MED ORDER — METRONIDAZOLE 500 MG PO TABS: 500.0000 mg | ORAL_TABLET | Freq: Two times a day (BID) | ORAL | 0 refills | Status: DC

## 2023-05-28 ENCOUNTER — Other Ambulatory Visit: Payer: Self-pay

## 2023-05-28 ENCOUNTER — Ambulatory Visit (INDEPENDENT_AMBULATORY_CARE_PROVIDER_SITE_OTHER): Payer: BC Managed Care – PPO | Admitting: Family Medicine

## 2023-05-28 ENCOUNTER — Emergency Department: Payer: BC Managed Care – PPO

## 2023-05-28 ENCOUNTER — Observation Stay
Admission: EM | Admit: 2023-05-28 | Discharge: 2023-05-30 | Disposition: A | Discharge status: Home / Self Care | Payer: BC Managed Care – PPO | Attending: Osteopathic Medicine | Admitting: Osteopathic Medicine

## 2023-05-28 ENCOUNTER — Encounter: Payer: Self-pay | Admitting: Family Medicine

## 2023-05-28 VITALS — BP 174/114 | HR 127 | Temp 98.2°F | Ht 66.0 in | Wt 197.0 lb

## 2023-05-28 DIAGNOSIS — K769 Liver disease, unspecified: Secondary | ICD-10-CM | POA: Diagnosis not present

## 2023-05-28 DIAGNOSIS — K920 Hematemesis: Secondary | ICD-10-CM | POA: Diagnosis not present

## 2023-05-28 DIAGNOSIS — R079 Chest pain, unspecified: Principal | ICD-10-CM | POA: Insufficient documentation

## 2023-05-28 DIAGNOSIS — K5730 Diverticulosis of large intestine without perforation or abscess without bleeding: Secondary | ICD-10-CM | POA: Diagnosis not present

## 2023-05-28 DIAGNOSIS — Z79899 Other long term (current) drug therapy: Secondary | ICD-10-CM | POA: Diagnosis not present

## 2023-05-28 DIAGNOSIS — E119 Type 2 diabetes mellitus without complications: Secondary | ICD-10-CM | POA: Insufficient documentation

## 2023-05-28 DIAGNOSIS — Z9049 Acquired absence of other specified parts of digestive tract: Secondary | ICD-10-CM | POA: Diagnosis not present

## 2023-05-28 DIAGNOSIS — R7989 Other specified abnormal findings of blood chemistry: Secondary | ICD-10-CM | POA: Diagnosis not present

## 2023-05-28 DIAGNOSIS — R000 Tachycardia, unspecified: Secondary | ICD-10-CM | POA: Diagnosis not present

## 2023-05-28 DIAGNOSIS — E876 Hypokalemia: Secondary | ICD-10-CM | POA: Insufficient documentation

## 2023-05-28 DIAGNOSIS — R1084 Generalized abdominal pain: Secondary | ICD-10-CM | POA: Diagnosis not present

## 2023-05-28 DIAGNOSIS — I10 Essential (primary) hypertension: Secondary | ICD-10-CM | POA: Diagnosis not present

## 2023-05-28 DIAGNOSIS — E871 Hypo-osmolality and hyponatremia: Secondary | ICD-10-CM | POA: Diagnosis not present

## 2023-05-28 DIAGNOSIS — R030 Elevated blood-pressure reading, without diagnosis of hypertension: Secondary | ICD-10-CM

## 2023-05-28 DIAGNOSIS — K219 Gastro-esophageal reflux disease without esophagitis: Secondary | ICD-10-CM | POA: Diagnosis not present

## 2023-05-28 DIAGNOSIS — R1010 Upper abdominal pain, unspecified: Secondary | ICD-10-CM | POA: Diagnosis not present

## 2023-05-28 DIAGNOSIS — R1013 Epigastric pain: Principal | ICD-10-CM | POA: Insufficient documentation

## 2023-05-28 DIAGNOSIS — R0602 Shortness of breath: Secondary | ICD-10-CM

## 2023-05-28 DIAGNOSIS — R109 Unspecified abdominal pain: Secondary | ICD-10-CM | POA: Diagnosis not present

## 2023-05-28 DIAGNOSIS — R778 Other specified abnormalities of plasma proteins: Secondary | ICD-10-CM | POA: Diagnosis not present

## 2023-05-28 LAB — TROPONIN I (HIGH SENSITIVITY)
Troponin I (High Sensitivity): 23 ng/L — ABNORMAL HIGH (ref <18)
Troponin I (High Sensitivity): 43 ng/L — ABNORMAL HIGH (ref <18)

## 2023-05-28 LAB — COMPREHENSIVE METABOLIC PANEL
ALT: 18 U/L (ref 0–44)
AST: 18 U/L (ref 15–41)
Albumin: 4.4 g/dL (ref 3.5–5.0)
Alkaline Phosphatase: 58 U/L (ref 38–126)
Anion gap: 14 (ref 5–15)
BUN: 11 mg/dL (ref 6–20)
CO2: 21 mmol/L — ABNORMAL LOW (ref 22–32)
Calcium: 9.2 mg/dL (ref 8.9–10.3)
Chloride: 95 mmol/L — ABNORMAL LOW (ref 98–111)
Creatinine, Ser: 0.75 mg/dL (ref 0.44–1.00)
GFR, Estimated: >60 mL/min (ref >60)
Glucose, Bld: 146 mg/dL — ABNORMAL HIGH (ref 70–99)
Potassium: 2.9 mmol/L — ABNORMAL LOW (ref 3.5–5.1)
Sodium: 134 mmol/L — ABNORMAL LOW (ref 135–145)
Total Bilirubin: 0.9 mg/dL (ref <1.2)
Total Protein: 8.6 g/dL — ABNORMAL HIGH (ref 6.5–8.1)

## 2023-05-28 LAB — CBC
HCT: 44.6 % (ref 36.0–46.0)
Hemoglobin: 15.2 g/dL — ABNORMAL HIGH (ref 12.0–15.0)
MCH: 25.3 pg — ABNORMAL LOW (ref 26.0–34.0)
MCHC: 34.1 g/dL (ref 30.0–36.0)
MCV: 74.3 fL — ABNORMAL LOW (ref 80.0–100.0)
Platelets: 429 10*3/uL — ABNORMAL HIGH (ref 150–400)
RBC: 6 MIL/uL — ABNORMAL HIGH (ref 3.87–5.11)
RDW: 14.7 % (ref 11.5–15.5)
WBC: 13.6 10*3/uL — ABNORMAL HIGH (ref 4.0–10.5)
nRBC: 0 % (ref 0.0–0.2)

## 2023-05-28 LAB — URINALYSIS, ROUTINE W REFLEX MICROSCOPIC
Bacteria, UA: NONE SEEN
Bilirubin Urine: NEGATIVE
Glucose, UA: NEGATIVE mg/dL
Ketones, ur: 20 mg/dL — AB
Leukocytes,Ua: NEGATIVE
Nitrite: NEGATIVE
Protein, ur: NEGATIVE mg/dL
Specific Gravity, Urine: >1.046 — ABNORMAL HIGH (ref 1.005–1.030)
pH: 5 (ref 5.0–8.0)

## 2023-05-28 LAB — POC URINE PREG, ED: Preg Test, Ur: NEGATIVE

## 2023-05-28 LAB — LIPASE, BLOOD: Lipase: 25 U/L (ref 11–51)

## 2023-05-28 LAB — HCG, QUANTITATIVE, PREGNANCY: hCG, Beta Chain, Quant, S: 2 m[IU]/mL (ref <5)

## 2023-05-28 MED ORDER — ONDANSETRON HCL 4 MG PO TABS: 8.0000 mg | ORAL_TABLET | Freq: Once | ORAL | Status: DC

## 2023-05-28 MED ORDER — POTASSIUM CHLORIDE 10 MEQ/100ML IV SOLN
10.0000 meq | INTRAVENOUS | Status: AC
  Administered 2023-05-28 (×2): 10 meq via INTRAVENOUS
  Filled 2023-05-28 (×2): qty 100

## 2023-05-28 MED ORDER — ONDANSETRON HCL 4 MG/2ML IJ SOLN
4.0000 mg | Freq: Once | INTRAMUSCULAR | Status: AC
  Administered 2023-05-28: 4 mg via INTRAVENOUS
  Filled 2023-05-28: qty 2

## 2023-05-28 MED ORDER — MORPHINE SULFATE (PF) 4 MG/ML IV SOLN
4.0000 mg | Freq: Once | INTRAVENOUS | Status: AC
Start: 2023-05-28 — End: 2023-05-28
  Administered 2023-05-28: 4 mg via INTRAVENOUS
  Filled 2023-05-28: qty 1

## 2023-05-28 MED ORDER — IOHEXOL 350 MG/ML SOLN
100.0000 mL | Freq: Once | INTRAVENOUS | Status: AC | PRN
  Administered 2023-05-28: 100 mL via INTRAVENOUS

## 2023-05-28 MED ORDER — SODIUM CHLORIDE 0.9 % IV BOLUS
1000.0000 mL | Freq: Once | INTRAVENOUS | Status: AC
  Administered 2023-05-28: 1000 mL via INTRAVENOUS

## 2023-05-28 NOTE — ED Provider Notes (Signed)
[facility] Provider Note    Event Date/Time   First MD Initiated Contact with Patient 05/28/23 2018     (approximate)   History   Abdominal Pain   HPI  [name] is a 37 year old female presenting to the emergency department for evaluation of abdominal pain and chest pain.  Patient reports that over the past week she has had worsened upper abdominal pain, greater on the right side with associated nausea and vomiting.  History of cholecystectomy and gastric sleeve.  Additionally reports that while in the waiting room she is developed chest pain and associated shortness of breath.  Had a witnessed syncopal episode in the waiting room and was brought back to a room for further evaluation.  Awake, but appears uncomfortable on my initial evaluation.  Tells me that she has been taking Ozempic and was started on Protonix a few months ago to help with her GI symptoms, but these have been worse over the past few days.  Chest pain described as a tightness across her chest.    Physical Exam   Triage Vital Signs: ED Triage Vitals  Encounter Vitals Group     BP 05/28/23 1600 100/67     Systolic BP Percentile --      Diastolic BP Percentile --      Pulse Rate 05/28/23 1600 (!) 120     Resp 05/28/23 1600 18     Temp 05/28/23 1600 98.3 F (36.8 C)     Temp Source 05/28/23 2025 Oral     SpO2 05/28/23 1600 100 %     Weight 05/28/23 1558 197 lb 9.6 oz (89.6 kg)     Height 05/28/23 1558 5\' 6"  (1.676 m)     Head Circumference --      Peak Flow --      Pain Score 05/28/23 1558 10     Pain Loc --      Pain Education --      Exclude from Growth Chart --     Most recent vital signs: Vitals:   05/28/23 1600 05/28/23 2025  BP: 100/67 (!) 155/125  Pulse: (!) 120 (!) 135  Resp: 18 (!) 33  Temp: 98.3 F (36.8 C) 98.3 F (36.8 C)  SpO2: 100% 100%     General: Awake, interactive  CV:  Tachycardic with regular rhythm Resp:  Tachypneic, but lungs clear to  auscultation Abd:  Nondistended.,  Soft, isolated tenderness to palpation in the epigastric and right upper quadrant Neuro:  Symmetric facial movement, fluid speech   ED Results / Procedures / Treatments   Labs (all labs ordered are listed, but only abnormal results are displayed) Labs Reviewed  COMPREHENSIVE METABOLIC PANEL - Abnormal; Notable for the following components:      Result Value   Sodium 134 (*)    Potassium 2.9 (*)    Chloride 95 (*)    CO2 21 (*)    Glucose, Bld 146 (*)    Total Protein 8.6 (*)    All other components within normal limits  CBC - Abnormal; Notable for the following components:   WBC 13.6 (*)    RBC 6.00 (*)    Hemoglobin 15.2 (*)    MCV 74.3 (*)    MCH 25.3 (*)    Platelets 429 (*)    All other components within normal limits  URINALYSIS, ROUTINE W REFLEX MICROSCOPIC - Abnormal; Notable for the following components:   Color, Urine STRAW (*)  APPearance CLEAR (*)    Specific Gravity, Urine >1.046 (*)    Hgb urine dipstick SMALL (*)    Ketones, ur 20 (*)    All other components within normal limits  TROPONIN I (HIGH SENSITIVITY) - Abnormal; Notable for the following components:   Troponin I (High Sensitivity) 23 (*)    All other components within normal limits  TROPONIN I (HIGH SENSITIVITY) - Abnormal; Notable for the following components:   Troponin I (High Sensitivity) 43 (*)    All other components within normal limits  LIPASE, BLOOD  HCG, QUANTITATIVE, PREGNANCY  MAGNESIUM  POC URINE PREG, ED     EKG EKG independently reviewed interpreted by myself (ER attending) demonstrates:  EKG demonstrate sinus tachycardia at a rate of 149, PR 118, QRS 94, QTc 425, nonspecific ST changes noted, possibly rate related  RADIOLOGY Imaging independently reviewed and interpreted by myself demonstrates:  CT of the chest without evidence of PE CT abdomen pelvis without bowel obstruction or other acute findings  PROCEDURES:  Critical Care  performed: Yes, see critical care procedure note(s)  CRITICAL CARE Performed by: Trinna Post   Total critical care time: 31 minutes  Critical care time was exclusive of separately billable procedures and treating other patients.  Critical care was necessary to treat or prevent imminent or life-threatening deterioration.  Critical care was time spent personally by me on the following activities: development of treatment plan with patient and/or surrogate as well as nursing, discussions with consultants, evaluation of patient's response to treatment, examination of patient, obtaining history from patient or surrogate, ordering and performing treatments and interventions, ordering and review of laboratory studies, ordering and review of radiographic studies, pulse oximetry and re-evaluation of patient's condition.   Procedures   MEDICATIONS ORDERED IN ED: Medications  aspirin tablet 325 mg (has no administration in time range)  sodium chloride 0.9 % bolus 1,000 mL (0 mLs Intravenous Stopped 05/28/23 2314)  ondansetron (ZOFRAN) injection 4 mg (4 mg Intravenous Given 05/28/23 2121)  morphine (PF) 4 MG/ML injection 4 mg (4 mg Intravenous Given 05/28/23 2122)  potassium chloride 10 mEq in 100 mL IVPB (0 mEq Intravenous Stopped 05/28/23 2313)  iohexol (OMNIPAQUE) 350 MG/ML injection 100 mL (100 mLs Intravenous Contrast Given 05/28/23 2110)     IMPRESSION / MDM / ASSESSMENT AND PLAN / ED COURSE  I reviewed the triage vital signs and the nursing notes.  Differential diagnosis includes, but is not limited to, appendicitis, colitis, bowel obstruction, ACS, PE, pneumonia, pneumothorax  Patient's presentation is most consistent with acute presentation with potential threat to life or bodily function.  37 year old female presenting with abdominal pain, later developing chest pain, found to be tachycardic on presentation, worsened while in the waiting room.  Labs here with leukocytosis at 13.6.   Labs notable for hypokalemia with K of 2.9.  Troponin from initial blood work slightly elevated at 23, repeat did uptrend to 43.  Urine without evidence of infection.  CT scans without significant findings.  On reevaluation after fluids, morphine, Zofran, patient does feel much improved, however with her chest pain and increasing troponin, did admission for further evaluation is reasonable.  Will reach out to hospitalist team.    FINAL CLINICAL IMPRESSION(S) / ED DIAGNOSES   Final diagnoses:  Epigastric pain  Elevated troponin     Rx / DC Orders   ED Discharge Orders     None        Note:  This document was prepared using Dragon voice  recognition software and may include unintentional dictation errors.   Trinna Post, MD 05/29/23 [phone]

## 2023-05-28 NOTE — ED Notes (Signed)
Unable to get labs. Lab called and will come to collect blood shortly

## 2023-05-28 NOTE — Addendum Note (Signed)
Addended by: Lonia Blood on: 05/28/2023 03:08 PM   Modules accepted: Orders

## 2023-05-28 NOTE — ED Triage Notes (Signed)
Pt comes with c/o upper abdominal pain for a week. Pt states N/V./ pt states 10/10 pain.

## 2023-05-28 NOTE — Progress Notes (Signed)
Patient ID: [name], female    DOB: 10/27/1985, 37 y.o.   MRN: [id number]  This visit was conducted in person.  BP (!) 174/114   Pulse (!) 127   Temp 98.2 F (36.8 C) (Oral)   Ht 5\' 6"  (1.676 m)   Wt 197 lb (89.4 kg)   SpO2 98%   BMI 31.80 kg/m    CC:  Chief Complaint  Patient presents with   Abdominal Pain    Abdominal pain and vomiting started Wed. Also feeling nausea, lightheaded, weak    Subjective:   HPI: [name] is a 37 y.o. female patient with history of sleeve gastrectomy presenting on 05/28/2023 for Abdominal Pain (Abdominal pain and vomiting started Wed. Also feeling nausea, lightheaded, weak)   Had similar pain 11/30 intermittent abdominal pain, epigastric, radiated right and down.  Reviewed recent ED visit on 05/26/2023  New onset abdominal pain x 3 days. 10/10  Nausea, vomiting now started in last 3 days  Saw bright red blood in emesis this AM.... significant amount  No BM in last 3 days  Very lightheaded, feels pre Has 8 oz pedialyte and water.   No sick contacts.   Took promethazine this AM at 11.  BP Readings from Last 3 Encounters:  05/28/23 (!) 174/114  05/24/23 (!) 157/110  01/11/23 (!) 137/94  Complete metabolic panel, creatinine 0.65, normal LFTs and electrolytes CBC WBCs 10.9, hemoglobin 13.8 Lipase normal 25 Urine culture: E. coli 50,000 200,000 units, pansensitive 12/11CT scan showed no evidence of acute intra-abdominal/pelvic pathology  Has been using zofran or promethazine today.. no help  History of sleeve gastrectomy and cholecystectomy    Has not taken ozempic this week   Relevant past medical, surgical, family and social history reviewed and updated as indicated. Interim medical history since our last visit reviewed. Allergies and medications reviewed and updated. Outpatient Medications Prior to Visit  Medication Sig Dispense Refill   etonogestrel (NEXPLANON) 68 MG IMPL implant 1 each by Subdermal route once.      metroNIDAZOLE (FLAGYL) 500 MG tablet Take 1 tablet (500 mg total) by mouth 2 (two) times daily for 7 days. No alcohol use for 10 days after starting medication 14 tablet 0   omeprazole (PRILOSEC) 40 MG capsule TAKE 1 CAPSULE(40 MG) BY MOUTH DAILY 90 capsule 0   Semaglutide,0.25 or 0.5MG /DOS, (OZEMPIC, 0.25 OR 0.5 MG/DOSE,) 2 MG/3ML SOPN Inject 0.25 mg into the skin once a week. 3 mL 11   Multiple Vitamins-Minerals (MULTIVITAMIN WOMEN PO) Take 1 tablet by mouth daily. (Patient not taking: Reported on 05/28/2023)     No facility-administered medications prior to visit.     Per HPI unless specifically indicated in ROS section below Review of Systems  Constitutional:  Positive for fatigue.  Respiratory:  Positive for shortness of breath.   Cardiovascular:  Positive for palpitations. Negative for chest pain and leg swelling.  Gastrointestinal:  Positive for abdominal pain, nausea and vomiting.  Neurological:  Positive for dizziness, weakness and light-headedness.       Hands cramping   Objective:  BP (!) 174/114   Pulse (!) 127   Temp 98.2 F (36.8 C) (Oral)   Ht 5\' 6"  (1.676 m)   Wt 197 lb (89.4 kg)   SpO2 98%   BMI 31.80 kg/m   Wt Readings from Last 3 Encounters:  05/28/23 197 lb (89.4 kg)  05/24/23 205 lb (93 kg)  01/11/23 213 lb (96.6 kg)  Physical Exam Constitutional:      General: She is in acute distress.     Appearance: She is toxic-appearing and diaphoretic.  HENT:     Mouth/Throat:     Mouth: Mucous membranes are dry.  Cardiovascular:     Rate and Rhythm: Tachycardia present.  Abdominal:     General: Bowel sounds are decreased. There is no distension.     Palpations: There is no mass.     Tenderness: There is generalized abdominal tenderness. There is no right CVA tenderness or left CVA tenderness.       Results for orders placed or performed in visit on 05/24/23  Cervicovaginal ancillary only   Collection Time: 05/24/23  1:46 PM  Result Value Ref  Range   Neisseria Gonorrhea Negative    Chlamydia Positive (A)    Trichomonas Negative    Bacterial Vaginitis (gardnerella) Positive (A)    Candida Vaginitis Negative    Candida Glabrata Negative    Comment      Normal Reference Range Bacterial Vaginosis - Negative   Comment Normal Reference Range Candida Species - Negative    Comment Normal Reference Range Candida Galbrata - Negative    Comment Normal Reference Range Trichomonas - Negative    Comment Normal Reference Ranger Chlamydia - Negative    Comment      Normal Reference Range Neisseria Gonorrhea - Negative  Cytology - PAP   Collection Time: 05/24/23  1:56 PM  Result Value Ref Range   High risk HPV Negative    Adequacy      Satisfactory for evaluation; transformation zone component ABSENT.   Diagnosis      - Negative for Intraepithelial Lesions or Malignancy (NILM)   Diagnosis - Benign reactive/reparative changes    Microorganisms Shift in flora suggestive of bacterial vaginosis    Comment Normal Reference Range HPV - Negative   RPR   Collection Time: 05/24/23  2:26 PM  Result Value Ref Range   RPR Ser Ql Non Reactive Non Reactive  HIV Antibody (routine testing w rflx)   Collection Time: 05/24/23  2:26 PM  Result Value Ref Range   HIV Screen 4th Generation wRfx Non Reactive Non Reactive    Assessment and Plan  Epigastric pain  Hematemesis with nausea  Elevated blood pressure reading  Tachycardia  Shortness of breath   Patient with extreme discomfort and tachycardia, shortness of breath in setting of hematemesis and uncontrollable nausea and vomiting.  Significantly decreased p.o. intake.  Recommend EMS transportation to emergency room for further evaluation, rule out sepsis and anxiety evaluate upper GI bleeding.   Given Zofran 8 mg x 1 sublingual.    No follow-ups on file.   Kerby Nora, MD

## 2023-05-28 NOTE — ED Provider Notes (Incomplete)
[facility] Provider Note    Event Date/Time   First MD Initiated Contact with Patient 05/28/23 2018     (approximate)   History   Abdominal Pain   HPI  [name] is a        Physical Exam   Triage Vital Signs: ED Triage Vitals  Encounter Vitals Group     BP 05/28/23 1600 100/67     Systolic BP Percentile --      Diastolic BP Percentile --      Pulse Rate 05/28/23 1600 (!) 120     Resp 05/28/23 1600 18     Temp 05/28/23 1600 98.3 F (36.8 C)     Temp Source 05/28/23 2025 Oral     SpO2 05/28/23 1600 100 %     Weight 05/28/23 1558 197 lb 9.6 oz (89.6 kg)     Height 05/28/23 1558 5\' 6"  (1.676 m)     Head Circumference --      Peak Flow --      Pain Score 05/28/23 1558 10     Pain Loc --      Pain Education --      Exclude from Growth Chart --     Most recent vital signs: Vitals:   05/28/23 1600 05/28/23 2025  BP: 100/67 (!) 155/125  Pulse: (!) 120 (!) 135  Resp: 18 (!) 33  Temp: 98.3 F (36.8 C) 98.3 F (36.8 C)  SpO2: 100% 100%     General: Awake, interactive *** CV:  Regular rate, good peripheral perfusion. *** Resp:  Unlabored respirations. *** Abd:  Nondistended. *** Neuro:  Symmetric facial movement, fluid speech***   ED Results / Procedures / Treatments   Labs (all labs ordered are listed, but only abnormal results are displayed) Labs Reviewed  COMPREHENSIVE METABOLIC PANEL - Abnormal; Notable for the following components:      Result Value   Sodium 134 (*)    Potassium 2.9 (*)    Chloride 95 (*)    CO2 21 (*)    Glucose, Bld 146 (*)    Total Protein 8.6 (*)    All other components within normal limits  CBC - Abnormal; Notable for the following components:   WBC 13.6 (*)    RBC 6.00 (*)    Hemoglobin 15.2 (*)    MCV 74.3 (*)    MCH 25.3 (*)    Platelets 429 (*)    All other components within normal limits  URINALYSIS, ROUTINE W REFLEX MICROSCOPIC - Abnormal; Notable for the following components:    Color, Urine STRAW (*)    APPearance CLEAR (*)    Specific Gravity, Urine >1.046 (*)    Hgb urine dipstick SMALL (*)    Ketones, ur 20 (*)    All other components within normal limits  TROPONIN I (HIGH SENSITIVITY) - Abnormal; Notable for the following components:   Troponin I (High Sensitivity) 23 (*)    All other components within normal limits  LIPASE, BLOOD  HCG, QUANTITATIVE, PREGNANCY  POC URINE PREG, ED  TROPONIN I (HIGH SENSITIVITY)     EKG EKG independently reviewed interpreted by myself (ER attending) demonstrates:    RADIOLOGY Imaging independently reviewed and interpreted by myself demonstrates:    PROCEDURES:  Critical Care performed: {CriticalCareYesNo:19197::"Yes, see critical care procedure note(s)","No"}  Procedures   MEDICATIONS ORDERED IN ED: Medications  sodium chloride 0.9 % bolus 1,000 mL (0 mLs Intravenous Stopped 05/28/23 2314)  ondansetron (ZOFRAN) injection  4 mg (4 mg Intravenous Given 05/28/23 2121)  morphine (PF) 4 MG/ML injection 4 mg (4 mg Intravenous Given 05/28/23 2122)  potassium chloride 10 mEq in 100 mL IVPB (0 mEq Intravenous Stopped 05/28/23 2313)  iohexol (OMNIPAQUE) 350 MG/ML injection 100 mL (100 mLs Intravenous Contrast Given 05/28/23 2110)     IMPRESSION / MDM / ASSESSMENT AND PLAN / ED COURSE  I reviewed the triage vital signs and the nursing notes.  Differential diagnosis includes, but is not limited to, ***  Patient's presentation is most consistent with {EM COPA:27473}  ***      FINAL CLINICAL IMPRESSION(S) / ED DIAGNOSES   Final diagnoses:  None     Rx / DC Orders   ED Discharge Orders     None        Note:  This document was prepared using Dragon voice recognition software and may include unintentional dictation errors.

## 2023-05-29 ENCOUNTER — Observation Stay (HOSPITAL_BASED_OUTPATIENT_CLINIC_OR_DEPARTMENT_OTHER)
Admit: 2023-05-29 | Discharge: 2023-05-29 | Disposition: A | Discharge status: Home / Self Care | Payer: BC Managed Care – PPO | Attending: Cardiology | Admitting: Cardiology

## 2023-05-29 DIAGNOSIS — K219 Gastro-esophageal reflux disease without esophagitis: Secondary | ICD-10-CM | POA: Insufficient documentation

## 2023-05-29 DIAGNOSIS — E119 Type 2 diabetes mellitus without complications: Secondary | ICD-10-CM | POA: Diagnosis not present

## 2023-05-29 DIAGNOSIS — R072 Precordial pain: Secondary | ICD-10-CM | POA: Diagnosis not present

## 2023-05-29 DIAGNOSIS — R079 Chest pain, unspecified: Secondary | ICD-10-CM | POA: Diagnosis present

## 2023-05-29 DIAGNOSIS — R1013 Epigastric pain: Secondary | ICD-10-CM

## 2023-05-29 DIAGNOSIS — E871 Hypo-osmolality and hyponatremia: Secondary | ICD-10-CM | POA: Diagnosis not present

## 2023-05-29 DIAGNOSIS — I10 Essential (primary) hypertension: Secondary | ICD-10-CM

## 2023-05-29 DIAGNOSIS — E876 Hypokalemia: Secondary | ICD-10-CM | POA: Diagnosis not present

## 2023-05-29 DIAGNOSIS — R7989 Other specified abnormal findings of blood chemistry: Secondary | ICD-10-CM | POA: Diagnosis not present

## 2023-05-29 LAB — BASIC METABOLIC PANEL
Anion gap: 10 (ref 5–15)
BUN: 11 mg/dL (ref 6–20)
CO2: 23 mmol/L (ref 22–32)
Calcium: 8.8 mg/dL — ABNORMAL LOW (ref 8.9–10.3)
Chloride: 104 mmol/L (ref 98–111)
Creatinine, Ser: 0.8 mg/dL (ref 0.44–1.00)
GFR, Estimated: >60 mL/min (ref >60)
Glucose, Bld: 103 mg/dL — ABNORMAL HIGH (ref 70–99)
Potassium: 3.6 mmol/L (ref 3.5–5.1)
Sodium: 137 mmol/L (ref 135–145)

## 2023-05-29 LAB — ECHOCARDIOGRAM COMPLETE
AR max vel: 1.93 cm2
AV Peak grad: 6.2 mm[Hg]
Ao pk vel: 1.24 m/s
Area-P 1/2: 3.6 cm2
Height: 66 in
S' Lateral: 2.6 cm
Weight: 3161.6 [oz_av]

## 2023-05-29 LAB — CBC
HCT: 37.7 % (ref 36.0–46.0)
Hemoglobin: 12.7 g/dL (ref 12.0–15.0)
MCH: 25.2 pg — ABNORMAL LOW (ref 26.0–34.0)
MCHC: 33.7 g/dL (ref 30.0–36.0)
MCV: 74.8 fL — ABNORMAL LOW (ref 80.0–100.0)
Platelets: 346 10*3/uL (ref 150–400)
RBC: 5.04 MIL/uL (ref 3.87–5.11)
RDW: 15 % (ref 11.5–15.5)
WBC: 13.2 10*3/uL — ABNORMAL HIGH (ref 4.0–10.5)
nRBC: 0 % (ref 0.0–0.2)

## 2023-05-29 LAB — CBG MONITORING, ED
Glucose-Capillary: 128 mg/dL — ABNORMAL HIGH (ref 70–99)
Glucose-Capillary: 124 mg/dL — ABNORMAL HIGH (ref 70–99)
Glucose-Capillary: 102 mg/dL — ABNORMAL HIGH (ref 70–99)
Glucose-Capillary: 85 mg/dL (ref 70–99)

## 2023-05-29 LAB — TROPONIN I (HIGH SENSITIVITY): Troponin I (High Sensitivity): 25 ng/L — ABNORMAL HIGH (ref <18)

## 2023-05-29 LAB — MAGNESIUM: Magnesium: 1.9 mg/dL (ref 1.7–2.4)

## 2023-05-29 MED ORDER — TRAZODONE HCL 50 MG PO TABS: 25.0000 mg | ORAL_TABLET | Freq: Every evening | ORAL | Status: DC | PRN

## 2023-05-29 MED ORDER — METOCLOPRAMIDE HCL 10 MG PO TABS
5.0000 mg | ORAL_TABLET | Freq: Three times a day (TID) | ORAL | Status: DC
  Administered 2023-05-29: 5 mg via ORAL
  Filled 2023-05-29: qty 1

## 2023-05-29 MED ORDER — ENOXAPARIN SODIUM 60 MG/0.6ML IJ SOSY
45.0000 mg | PREFILLED_SYRINGE | INTRAMUSCULAR | Status: DC
  Administered 2023-05-29: 45 mg via SUBCUTANEOUS
  Filled 2023-05-29: qty 0.6

## 2023-05-29 MED ORDER — ASPIRIN 81 MG PO TBEC
81.0000 mg | DELAYED_RELEASE_TABLET | Freq: Every day | ORAL | Status: DC
  Administered 2023-05-29 – 2023-05-30 (×2): 81 mg via ORAL
  Filled 2023-05-29 (×2): qty 1

## 2023-05-29 MED ORDER — MORPHINE SULFATE (PF) 2 MG/ML IV SOLN: 2.0000 mg | INTRAVENOUS | Status: DC | PRN

## 2023-05-29 MED ORDER — INSULIN ASPART 100 UNIT/ML IJ SOLN: 0.0000 [IU] | Freq: Three times a day (TID) | INTRAMUSCULAR | Status: DC

## 2023-05-29 MED ORDER — ASPIRIN 325 MG PO TABS
325.0000 mg | ORAL_TABLET | Freq: Once | ORAL | Status: AC
  Administered 2023-05-29: 325 mg via ORAL
  Filled 2023-05-29: qty 1

## 2023-05-29 MED ORDER — INSULIN ASPART 100 UNIT/ML IJ SOLN: 0.0000 [IU] | Freq: Every day | INTRAMUSCULAR | Status: DC

## 2023-05-29 MED ORDER — MAGNESIUM HYDROXIDE 400 MG/5ML PO SUSP: 30.0000 mL | Freq: Every day | ORAL | Status: DC | PRN

## 2023-05-29 MED ORDER — NITROGLYCERIN 0.4 MG SL SUBL: 0.4000 mg | SUBLINGUAL_TABLET | SUBLINGUAL | Status: DC | PRN

## 2023-05-29 MED ORDER — POTASSIUM CHLORIDE IN NACL 20-0.9 MEQ/L-% IV SOLN
INTRAVENOUS | Status: AC
  Filled 2023-05-29: qty 1000

## 2023-05-29 MED ORDER — PANTOPRAZOLE SODIUM 40 MG PO TBEC
40.0000 mg | DELAYED_RELEASE_TABLET | Freq: Every day | ORAL | Status: DC
  Administered 2023-05-29 – 2023-05-30 (×2): 40 mg via ORAL
  Filled 2023-05-29 (×2): qty 1

## 2023-05-29 MED ORDER — ACETAMINOPHEN 325 MG PO TABS: 650.0000 mg | ORAL_TABLET | Freq: Four times a day (QID) | ORAL | Status: DC | PRN

## 2023-05-29 MED ORDER — MAGNESIUM SULFATE IN D5W 1-5 GM/100ML-% IV SOLN
1.0000 g | Freq: Once | INTRAVENOUS | Status: AC
  Administered 2023-05-29: 1 g via INTRAVENOUS
  Filled 2023-05-29: qty 100

## 2023-05-29 MED ORDER — ONDANSETRON HCL 4 MG PO TABS: 4.0000 mg | ORAL_TABLET | Freq: Four times a day (QID) | ORAL | Status: DC | PRN

## 2023-05-29 MED ORDER — POTASSIUM CHLORIDE 20 MEQ PO PACK
40.0000 meq | PACK | Freq: Once | ORAL | Status: AC
  Administered 2023-05-29: 40 meq via ORAL
  Filled 2023-05-29: qty 2

## 2023-05-29 MED ORDER — ACETAMINOPHEN 650 MG RE SUPP: 650.0000 mg | Freq: Four times a day (QID) | RECTAL | Status: DC | PRN

## 2023-05-29 MED ORDER — ONDANSETRON HCL 4 MG/2ML IJ SOLN
4.0000 mg | Freq: Four times a day (QID) | INTRAMUSCULAR | Status: DC | PRN
Start: 2023-05-29 — End: 2023-05-30

## 2023-05-29 NOTE — Assessment & Plan Note (Addendum)
-   The patient will be admitted to an observation cardiac telemetry bed. - This was associated with syncope. - Will follow serial troponins and EKGs. - The patient will be placed on aspirin as well as p.r.n. sublingual nitroglycerin and morphine sulfate for pain. - We will obtain a cardiology consult in a.m. for further cardiac risk stratification. - I notified [facility] group about the patient - We will check her orthostatics and monitor for arrhythmias given her syncope.

## 2023-05-29 NOTE — Consult Note (Signed)
Cardiology Consult    Patient ID: [name] MRN: [id number], DOB/AGE: Oct 01, 1985   Admit date: 05/28/2023 Date of Consult: 05/29/2023  Primary Physician: Excell Seltzer, MD Primary Cardiologist: Debbe Odea, MD - new Requesting Provider: Drusilla Kanner, MD  Patient Profile    [name] is a 37 y.o. female with a history of diabetes (diagnosed in 2010), obesity status post gastric sleeve, untreated hypertension, and GERD, who is being seen today for the evaluation of chest pain and mild troponin elevation at the request of Dr. Sharl Ma.  Past Medical History  Subjective  Past Medical History:  Diagnosis Date   Diabetes mellitus without complication (HCC)    before gastric sleeve procedure   GERD (gastroesophageal reflux disease)    Headache    when BP is up   Hypertension    Wears contact lenses     Past Surgical History:  Procedure Laterality Date   CESAREAN SECTION  04/30/2009   decreased fetal heart tones due to umbilical cord wrapped   CHOLECYSTECTOMY     ESOPHAGOGASTRODUODENOSCOPY (EGD) WITH PROPOFOL N/A 10/24/2015   Procedure: ESOPHAGOGASTRODUODENOSCOPY (EGD) WITH PROPOFOL;  Surgeon: Midge Minium, MD;  Location: [facility];  Service: Endoscopy;  Laterality: N/A;   LAPAROSCOPIC GASTRIC SLEEVE RESECTION N/A 03/19/2015   Procedure: LAPAROSCOPIC GASTRIC SLEEVE RESECTION;  Surgeon: Everette Rank, MD;  Location: [facility];  Service: General;  Laterality: N/A;     Allergies  Allergies  Allergen Reactions   Tomato Rash      History of Present Illness     37 y.o. female with medical history significant for type diabetes mellitus, obesity s/p gastric sleeve, GERD and hypertension.  She was diagnosed with diabetes in 2014.  In 2016, she underwent gastric sleeve with subsequent weight loss.  She notes that over the years, A1c was variable but did get as high as 12 in 2023.  Late last year, she was placed on Ozempic.  Ever since, she has had intermittent GI  distress.  She was placed on a PPI over the summer 2024 without any change in her abdominal symptoms.  She frequently doses herself with ibuprofen, sometimes as much as 1600 mg a day secondary to abdominal discomfort.  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  She took 800 mg of ibuprofen and then her supervisor sent her home.  She subsequently developed nausea and vomiting, and presented to the [facility] ED on December 11 though left due to the wait.  She then presented to the [facility] emergency department where she was hypertensive at 190/100.  Workup was unremarkable and she was discharged home with promethazine.  Due to ongoing abdominal discomfort, nausea, and vomiting, she presented to the [facility] emergency department on December 13.  While in the waiting area, she had sudden onset of contractures of her hands and muscles in her face associated with weakness, and chest discomfort.  She felt very weak and slumped over in her chair though notes that she retained consciousness.  She was rushed back into the emergency room and within about 20 minutes, symptoms resolved.  After being brought back in the ED, she was afebrile and tachycardic (sinus).  BP initially 100/67 and subsequently 155/125 per ED records.  ECG showed sinus tachycardia @ 128, RAE, LVH.  Labs notable for hypokalemia @ 2.9, Cl 95, CO2 21, WBC 13.6, hsTrop 23  43  25.  CTA chest  neg for PE.  CTA abd/pelvis w/ colonic diverticulosis w/o diverticulitis, and lesion of left lobe of liver - unchanged from prior.  She has had no recurrence of chest pain and denies any prior history of chest pain or dyspnea preceding this hospitalization.  Inpatient Medications  Subjective    aspirin EC  81 mg Oral Daily   enoxaparin (LOVENOX) injection  45 mg Subcutaneous Q24H   insulin aspart  0-5 Units Subcutaneous QHS   insulin aspart  0-9 Units Subcutaneous  TID WC   metoCLOPramide  5 mg Oral TID AC   ondansetron  8 mg Oral Once   pantoprazole  40 mg Oral Daily    Family History    Family History  Problem Relation Age of Onset   Diabetes Father    Hypertension Father    Hyperlipidemia Father    Hypertension Sister    Birth defects Sister        fluid on brain cause blindness in B eye   Breast cancer Maternal Aunt 30       has contact   Breast cancer Maternal Aunt 30       has contact   Prostate cancer Paternal Grandfather    Breast cancer Cousin 30       has contact   She indicated that her mother is alive. She indicated that her father is alive. She indicated that all of her four sisters are alive. She indicated that her brother is alive. She indicated that the status of her paternal grandfather is unknown. She indicated that both of her maternal aunts are alive. She indicated that her cousin is alive.   Social History    Social History   Socioeconomic History   Marital status: Single    Spouse name: Not on file   Number of children: Not on file   Years of education: Not on file   Highest education level: Some college, no degree  Occupational History   Not on file  Tobacco Use   Smoking status: Never   Smokeless tobacco: Never  Vaping Use   Vaping status: Former  Substance and Sexual Activity   Alcohol use: Yes    Comment: 1 drink/mo   Drug use: No   Sexual activity: Yes    Partners: Male    Birth control/protection: Implant    Comment: Nutraplanada  Other Topics Concern   Not on file  Social History Narrative   Single, on child age 65.   Social Drivers of Health   Financial Resource Strain: Medium Risk (10/05/2022)   Overall Financial Resource Strain (CARDIA)    Difficulty of Paying Living Expenses: Somewhat hard  Food Insecurity: Food Insecurity Present (10/05/2022)   Hunger Vital Sign    Worried About Running Out of Food in the Last Year: Sometimes true    Ran Out of Food in the Last Year: Sometimes true   Transportation Needs: No Transportation Needs (10/05/2022)   PRAPARE - Administrator, Civil Service (Medical): No    Lack of Transportation (Non-Medical): No  Physical Activity: Sufficiently Active (10/05/2022)   Exercise Vital Sign    Days of Exercise per Week: 7 days    Minutes of Exercise per Session: 30 min  Stress: No Stress Concern Present (10/05/2022)   [employer] of Occupational Health - Occupational Stress Questionnaire    Feeling of Stress : Only a little  Social Connections: Moderately Isolated (10/05/2022)   Social Connection and Isolation Panel [NHANES]  Frequency of Communication with Friends and Family: More than three times a week    Frequency of Social Gatherings with Friends and Family: Twice a week    Attends Religious Services: 1 to 4 times per year    Active Member of [employer] or Organizations: No    Attends Engineer, structural: Not on file    Marital Status: Never married  Intimate Partner Violence: Not on file     Review of Systems    General:  +++ Generalized malaise and weakness.  No chills, fever, night sweats or weight changes.  Cardiovascular:  +++ chest pain, dyspnea on exertion, edema, orthopnea, palpitations, paroxysmal nocturnal dyspnea. Dermatological: No rash, lesions/masses Respiratory: No cough, dyspnea Urologic: No hematuria, dysuria Abdominal:   +++ nausea, +++ vomiting, no diarrhea, bright red blood per rectum, melena, or hematemesis Neurologic:  No visual changes, wkns, changes in mental status. All other systems reviewed and are otherwise negative except as noted above.    Objective  Physical Exam    Blood pressure 110/80, pulse 87, temperature 98 F (36.7 C), temperature source Oral, resp. rate 18, height 5\' 6"  (1.676 m), weight 89.6 kg, SpO2 100%.  General: Pleasant, NAD Psych: Normal affect. Neuro: Alert and oriented X 3. Moves all extremities spontaneously. HEENT: Normal  Neck: Supple without bruits or  JVD. Lungs:  Resp regular and unlabored, CTA. Heart: RRR no s3, s4, or murmurs. Abdomen: Soft, non-tender, non-distended, BS + x 4.  Extremities: No clubbing, cyanosis or edema. DP/PT2+, Radials 2+ and equal bilaterally.  Labs    Cardiac Enzymes Recent Labs  Lab 05/28/23 1612 05/28/23 2322 05/29/23 0602  TROPONINIHS 23* 43* 25*      Lab Results  Component Value Date   WBC 13.2 (H) 05/29/2023   HGB 12.7 05/29/2023   HCT 37.7 05/29/2023   MCV 74.8 (L) 05/29/2023   PLT 346 05/29/2023    Recent Labs  Lab 05/28/23 1612 05/29/23 0602  NA 134* 137  K 2.9* 3.6  CL 95* 104  CO2 21* 23  BUN 11 11  CREATININE 0.75 0.80  CALCIUM 9.2 8.8*  PROT 8.6*  --   BILITOT 0.9  --   ALKPHOS 58  --   ALT 18  --   AST 18  --   GLUCOSE 146* 103*   Lab Results  Component Value Date   CHOL 149 05/20/2022   HDL 46.30 05/20/2022   LDLCALC 91 05/20/2022   TRIG 62.0 05/20/2022      Radiology Studies    CT ABDOMEN PELVIS W CONTRAST Result Date: 05/28/2023 CLINICAL DATA:  Acute abdominal pain, right-sided. History of cholecystectomy and gastric sleeve. EXAM: CT ABDOMEN AND PELVIS WITH CONTRAST TECHNIQUE: Multidetector CT imaging of the abdomen and pelvis was performed using the standard protocol following bolus administration of intravenous contrast. RADIATION DOSE REDUCTION: This exam was performed according to the departmental dose-optimization program which includes automated exposure control, adjustment of the mA and/or kV according to patient size and/or use of iterative reconstruction technique. CONTRAST:  OMNIPAQUE IOHEXOL 350 MG/ML SOLN COMPARISON:  CT abdomen and pelvis 12/01/2019. CT abdomen and pelvis 04/05/2015 and 01/01/2014. FINDINGS: Lower chest: No acute abnormality. Hepatobiliary: Gallbladder is surgically absent. There is no biliary ductal dilatation. There is a vague hypodense lesion in the left lobe of the liver measuring 2.0 by 1.0 cm with some peripheral  calcification. Calcification was seen in 2016 but, but the hypodense lesion was not as well defined on the prior study. The  liver is otherwise within normal limits. Pancreas: Unremarkable. No pancreatic ductal dilatation or surrounding inflammatory changes. Spleen: Normal in size without focal abnormality. Adrenals/Urinary Tract: Adrenal glands are unremarkable. Kidneys are normal, without renal calculi, focal lesion, or hydronephrosis. Bladder is unremarkable. Stomach/Bowel: Patient is status post gastric surgery. Appendix appears normal. No evidence of bowel wall thickening, distention, or inflammatory changes. There are few scattered colonic diverticula. Vascular/Lymphatic: No significant vascular findings are present. No enlarged abdominal or pelvic lymph nodes. Reproductive: Uterus and bilateral adnexa are unremarkable. Other: No abdominal wall hernia or abnormality. No abdominopelvic ascites. Musculoskeletal: No acute or significant osseous findings. IMPRESSION: 1. No acute localizing process in the abdomen or pelvis. 2. Vague hypodense lesion in the left lobe of the liver with some peripheral calcification. This is grossly unchanged compared to 2015, but indeterminate. Findings are favored as benign given stability. There is high clinical concern this can be further evaluated with an MRI. 3. Colonic diverticulosis without evidence for diverticulitis. Electronically Signed   By: Darliss Cheney M.D.   On: 05/28/2023 23:00   CT Angio Chest PE W and/or Wo Contrast Result Date: 05/28/2023 CLINICAL DATA:  Upper abdominal pain for 1 week. Nausea and vomiting. PE suspected. EXAM: CT ANGIOGRAPHY CHEST WITH CONTRAST TECHNIQUE: Multidetector CT imaging of the chest was performed using the standard protocol during bolus administration of intravenous contrast. Multiplanar CT image reconstructions and MIPs were obtained to evaluate the vascular anatomy. RADIATION DOSE REDUCTION: This exam was performed according to the  departmental dose-optimization program which includes automated exposure control, adjustment of the mA and/or kV according to patient size and/or use of iterative reconstruction technique. CONTRAST:  OMNIPAQUE IOHEXOL 350 MG/ML SOLN COMPARISON:  Chest radiograph 05/21/2016 FINDINGS: Cardiovascular: Negative for acute pulmonary embolism. Normal caliber thoracic aorta. No pericardial effusion. Mediastinum/Nodes: Trachea and esophagus are unremarkable. No thoracic adenopathy Lungs/Pleura: No focal consolidation, pleural effusion, or pneumothorax. Upper Abdomen: Reported separately. Musculoskeletal: No acute fracture. Review of the MIP images confirms the above findings. IMPRESSION: Negative for acute pulmonary embolism. No acute abnormality in the chest. Electronically Signed   By: Minerva Fester M.D.   On: 05/28/2023 22:56      ECG & Cardiac Imaging    sinus tachycardia @ 128, RAE, LVH  - personally reviewed.  Assessment & Plan    1.  Chest pain/elevated troponin: Patient presented to the emergency department with a 4-day history of persistent nausea, vomiting, and abdominal discomfort in the setting of Ozempic therapy.  While waiting in the emergency department, she had sudden onset of contractures of her hands, arms, and facial muscles associated with weakness, and chest discomfort.  Symptoms last about 20 minutes and resolved spontaneously.  Lab work has shown significant hypokalemia with potassium of 2.9 on presentation.  Troponins mildly elevated with relatively flat trend and peak of 43.  CT of the chest negative for PE.  She is currently symptom-free.  Echo pending.  Provided that echo looks okay, we would likely plan to pursue a coronary CT angiogram in the outpatient setting.  Continue aspirin.  2.  Hypokalemia: Ongoing supplementation per medicine team.  3.  Abdominal discomfort: This started last year in the setting of Ozempic therapy.  She notes that she has just been dealing with it  and dosing herself with ibuprofen, often up to 1600 mg a day.  CT of the abdomen and pelvis without acute findings.  Discussed the importance of avoiding NSAIDs in the setting of prior gastric bypass.  She has  been advised to discuss discontinuation of Ozempic with her primary care provider.  She is on a PPI at home.  4.  Elevated blood pressure: She had a few elevated blood pressures here but overall, pressures have been stable in the absence of antihypertensive therapy.  She was not being treated as an outpatient.  Follow.  5.  Sinus tachycardia: In the setting of above.  CT of the chest was negative for PE.  Rates currently improved at 87.  Risk Assessment/Risk Scores:     TIMI Risk Score for Unstable Angina or Non-ST Elevation MI:   The patient's TIMI risk score is 1, which indicates a 5% risk of all cause mortality, new or recurrent myocardial infarction or need for urgent revascularization in the next 14 days.      Signed, Nicolasa Ducking, NP 05/29/2023, 12:36 PM  For questions or updates, please contact   Please consult [email or web address] for contact info under Cardiology/STEMI.

## 2023-05-29 NOTE — Progress Notes (Addendum)
Subjective: Patient admitted this morning, see detailed H&P by Dr Arville Care  37 y.o. female with medical history significant for type diabetes mellitus, GERD and hypertension, presented to the emergency room with acute onset of epigastric abdominal pain felt as a sharp pain and chest pain felt as tightness and it was graded 10/10 last night.  Over the last week she has been having worsening upper abdominal pain greater on the right side with associated nausea and vomiting.  She has had a gastric sleeve and cholecystectomy in the past.  While in the waiting room she developed chest pain felt as tightness with associated dyspnea and had a witnessed syncopal episode when she was brought back to room for further evaluation.  She has been taking Ozempic was recently started on p.o. Protonix a few months ago to help with associated reflux symptoms.  Vitals:   05/29/23 0630 05/29/23 0640  BP:    Pulse: 89   Resp: 18   Temp:  98.5 F (36.9 C)  SpO2: 100%       A/P  Chest pain -Presented with 4-day history of nausea vomiting abdominal discomfort -Mild elevation of troponin -While in the ED patient had episode of chest discomfort with certain contractures of hands, arm and facial muscles -Found to have severe hypokalemia -Cardiology was consulted, echocardiogram ordered -Will follow echocardiogram -Follow orthostatic vital signs  Hypokalemia -Likely in setting of persistent nausea and vomiting -Potassium was 2.9 on admission -Replete    Hyponatremia - This is likely hypovolemic due to nausea and vomiting. -Replete    Type 2 diabetes mellitus without complications (HCC) - The patient will be placed on supplement coverage with NovoLog.   GERD without esophagitis - We will continue PPI therapy.   Recurrent nausea and vomiting -Likely in setting of Ozempic -Could also be due to gastroparesis -Restart Reglan 5 mg p.o. 3 times daily with meals    Meredeth Ide [facility]

## 2023-05-29 NOTE — Progress Notes (Signed)
PHARMACIST - PHYSICIAN COMMUNICATION  CONCERNING:  Enoxaparin (Lovenox) for DVT Prophylaxis    RECOMMENDATION: Patient was prescribed enoxaprin 40mg  q24 hours for VTE prophylaxis.   Filed Weights   05/28/23 1558  Weight: 89.6 kg (197 lb 9.6 oz)    Body mass index is 31.89 kg/m.  Estimated Creatinine Clearance: 110.6 mL/min (by C-G formula based on SCr of 0.75 mg/dL).   Based on [facility] policy patient is candidate for enoxaparin 0.5mg /kg TBW SQ every 24 hours based on BMI being >30.  DESCRIPTION: Pharmacy has adjusted enoxaparin dose per [facility] policy.  Patient is now receiving enoxaparin 0.5 mg/kg every 24 hours   Otelia Sergeant, PharmD, [facility] 05/29/2023 1:47 AM

## 2023-05-29 NOTE — Assessment & Plan Note (Signed)
-   We will continue PPI therapy 

## 2023-05-29 NOTE — Progress Notes (Signed)
  Echocardiogram 2D Echocardiogram has been performed.  [name] 05/29/2023, 1:49 PM

## 2023-05-29 NOTE — Assessment & Plan Note (Signed)
-  We will replace potassium and check magnesium level. 

## 2023-05-29 NOTE — Assessment & Plan Note (Signed)
-   This is likely hypovolemic due to nausea and vomiting. - Will be hydrated with IV normal saline and will follow BMP.

## 2023-05-29 NOTE — Assessment & Plan Note (Signed)
 -   The patient will be placed on supplement coverage with NovoLog 

## 2023-05-29 NOTE — H&P (Addendum)
[place]   PATIENT NAME: [name]    MR#:  [id number]  DATE OF BIRTH:  07-30-1985  DATE OF ADMISSION:  05/28/2023  PRIMARY CARE PHYSICIAN: Excell Seltzer, MD   Patient is coming from: Home  REQUESTING/REFERRING PHYSICIAN: Chiquita Loth, MD  CHIEF COMPLAINT:   Chief Complaint  Patient presents with   Abdominal Pain    HISTORY OF PRESENT ILLNESS:  [name] is a 37 y.o. female with medical history significant for type diabetes mellitus, GERD and hypertension, presented to the emergency room with acute onset of epigastric abdominal pain felt as a sharp pain and chest pain felt as tightness and it was graded 10/10 last night.  Over the last week she has been having worsening upper abdominal pain greater on the right side with associated nausea and vomiting.  She has had a gastric sleeve and cholecystectomy in the past.  While in the waiting room she developed chest pain felt as tightness with associated dyspnea and had a witnessed syncopal episode when she was brought back to room for further evaluation.  She has been taking Ozempic was recently started on p.o. Protonix a few months ago to help with associated reflux symptoms.  No fever or chills.  No nausea or vomiting or diarrhea.  No bleeding diathesis.  She admitted to cough occasionally productive with blood streaks with excessive episodes.  ED Course: When she came to the ER, BP was 174/114 with heart rate 127 otherwise normal vital signs.  Labs revealed hyponatremia of 134 with hypochloremia 95, hypokalemia of 2.9, CO2 21 and glucose of 146.  Total protein was 8.6 with otherwise unremarkable CMP.  Magnesium is 1.9.  High sensitive troponin I was 23 and later 43.  CBC showed leukocytosis of 13.6 and thrombocytosis and hemoconcentration.  Urine pregnancy test was negative.  Serum pregnancy test was negative.  UA showed specific gravity more than 1046 with 20 ketones. EKG as reviewed by me : EKG showed KG showed sinus  tachycardia with rate of 128 with right atrial enlargement and minimal voltage criteria for LVH Imaging: CT of the abdomen and pelvis with contrast showed the following: 1. No acute localizing process in the abdomen or pelvis. 2. Vague hypodense lesion in the left lobe of the liver with some peripheral calcification. This is grossly unchanged compared to 2015, but indeterminate. Findings are favored as benign given stability.  If there is high clinical concern this can be further evaluated with an MRI. 3. Colonic diverticulosis without evidence for diverticulitis.  Chest CTA revealed no evidence for PE or acute abnormality.  The patient was 4mg  IV morphine sulfate, 4 mg of IV Zofran, 10 milli-equivalent IV potassium chloride, 1 L bolus of IV normal saline and 325 mg of p.o. aspirin.  She will be admitted to an observation cardiac telemetry bed for further evaluation and management.  Aspirin   PAST MEDICAL HISTORY:   Past Medical History:  Diagnosis Date   Diabetes mellitus without complication (HCC)    before gastric sleeve procedure   GERD (gastroesophageal reflux disease)    Headache    when BP is up   Hypertension    Wears contact lenses     PAST SURGICAL HISTORY:   Past Surgical History:  Procedure Laterality Date   CESAREAN SECTION  04/30/2009   decreased fetal heart tones due to umbilical cord wrapped   CHOLECYSTECTOMY     ESOPHAGOGASTRODUODENOSCOPY (EGD) WITH PROPOFOL N/A 10/24/2015   Procedure: ESOPHAGOGASTRODUODENOSCOPY (  EGD) WITH PROPOFOL;  Surgeon: Midge Minium, MD;  Location: [facility];  Service: Endoscopy;  Laterality: N/A;   LAPAROSCOPIC GASTRIC SLEEVE RESECTION N/A 03/19/2015   Procedure: LAPAROSCOPIC GASTRIC SLEEVE RESECTION;  Surgeon: Everette Rank, MD;  Location: [facility];  Service: General;  Laterality: N/A;    SOCIAL HISTORY:   Social History   Tobacco Use   Smoking status: Never   Smokeless tobacco: Never  Substance Use Topics   Alcohol  use: Yes    Comment: 1 drink/mo    FAMILY HISTORY:   Family History  Problem Relation Age of Onset   Diabetes Father    Hypertension Father    Hyperlipidemia Father    Hypertension Sister    Birth defects Sister        fluid on brain cause blindness in B eye   Breast cancer Maternal Aunt 30       has contact   Breast cancer Maternal Aunt 30       has contact   Prostate cancer Paternal Grandfather    Breast cancer Cousin 30       has contact    DRUG ALLERGIES:   Allergies  Allergen Reactions   Tomato Rash    REVIEW OF SYSTEMS:   ROS As per history of present illness. All pertinent systems were reviewed above. Constitutional, HEENT, cardiovascular, respiratory, GI, GU, musculoskeletal, neuro, psychiatric, endocrine, integumentary and hematologic systems were reviewed and are otherwise negative/unremarkable except for positive findings mentioned above in the HPI.   MEDICATIONS AT HOME:   Prior to Admission medications   Medication Sig Start Date End Date Taking? Authorizing Provider  etonogestrel (NEXPLANON) 68 MG IMPL implant 1 each by Subdermal route once. 12/23/21   [provider]  metroNIDAZOLE (FLAGYL) 500 MG tablet Take 1 tablet (500 mg total) by mouth 2 (two) times daily for 7 days. No alcohol use for 10 days after starting medication 05/27/23 06/03/23  Dominica Severin, CNM  Multiple Vitamins-Minerals (MULTIVITAMIN WOMEN PO) Take 1 tablet by mouth daily. Patient not taking: Reported on 05/28/2023    [provider]  omeprazole (PRILOSEC) 40 MG capsule TAKE 1 CAPSULE(40 MG) BY MOUTH DAILY 04/01/22   Bedsole, Amy E, MD  Semaglutide,0.25 or 0.5MG /DOS, (OZEMPIC, 0.25 OR 0.5 MG/DOSE,) 2 MG/3ML SOPN Inject 0.25 mg into the skin once a week. 05/22/22   Bedsole, Amy E, MD      VITAL SIGNS:  Blood pressure (!) 140/105, pulse 82, temperature 97.9 F (36.6 C), temperature source Axillary, resp. rate (!) 23, height 5\' 6"  (1.676 m), weight 89.6 kg, SpO2  100%.  PHYSICAL EXAMINATION:  Physical Exam  GENERAL:  37 y.o.-year-old patient lying in the bed with no acute distress.  EYES: Pupils equal, round, reactive to light and accommodation. No scleral icterus. Extraocular muscles intact.  HEENT: Head atraumatic, normocephalic. Oropharynx and nasopharynx clear.  NECK:  Supple, no jugular venous distention. No thyroid enlargement, no tenderness.  LUNGS: Normal breath sounds bilaterally, no wheezing, rales,rhonchi or crepitation. No use of accessory muscles of respiration.  CARDIOVASCULAR: Regular rate and rhythm, S1, S2 normal. No murmurs, rubs, or gallops.  ABDOMEN: Soft, nondistended, nontender. Bowel sounds present. No organomegaly or mass.  EXTREMITIES: No pedal edema, cyanosis, or clubbing.  NEUROLOGIC: Cranial nerves II through XII are intact. Muscle strength 5/5 in all extremities. Sensation intact. Gait not checked.  PSYCHIATRIC: The patient is alert and oriented x 3.  Normal affect and good eye contact. SKIN: No obvious rash, lesion, or  ulcer.   LABORATORY PANEL:   CBC Recent Labs  Lab 05/28/23 1612  WBC 13.6*  HGB 15.2*  HCT 44.6  PLT 429*   ------------------------------------------------------------------------------------------------------------------  Chemistries  Recent Labs  Lab 05/28/23 1612 05/28/23 2326  NA 134*  --   K 2.9*  --   CL 95*  --   CO2 21*  --   GLUCOSE 146*  --   BUN 11  --   CREATININE 0.75  --   CALCIUM 9.2  --   MG  --  1.9  AST 18  --   ALT 18  --   ALKPHOS 58  --   BILITOT 0.9  --    ------------------------------------------------------------------------------------------------------------------  Cardiac Enzymes No results for input(s): "TROPONINI" in the last 168 hours. ------------------------------------------------------------------------------------------------------------------  RADIOLOGY:  CT ABDOMEN PELVIS W CONTRAST Result Date: 05/28/2023 CLINICAL DATA:  Acute  abdominal pain, right-sided. History of cholecystectomy and gastric sleeve. EXAM: CT ABDOMEN AND PELVIS WITH CONTRAST TECHNIQUE: Multidetector CT imaging of the abdomen and pelvis was performed using the standard protocol following bolus administration of intravenous contrast. RADIATION DOSE REDUCTION: This exam was performed according to the departmental dose-optimization program which includes automated exposure control, adjustment of the mA and/or kV according to patient size and/or use of iterative reconstruction technique. CONTRAST:  OMNIPAQUE IOHEXOL 350 MG/ML SOLN COMPARISON:  CT abdomen and pelvis 12/01/2019. CT abdomen and pelvis 04/05/2015 and 01/01/2014. FINDINGS: Lower chest: No acute abnormality. Hepatobiliary: Gallbladder is surgically absent. There is no biliary ductal dilatation. There is a vague hypodense lesion in the left lobe of the liver measuring 2.0 by 1.0 cm with some peripheral calcification. Calcification was seen in 2016 but, but the hypodense lesion was not as well defined on the prior study. The liver is otherwise within normal limits. Pancreas: Unremarkable. No pancreatic ductal dilatation or surrounding inflammatory changes. Spleen: Normal in size without focal abnormality. Adrenals/Urinary Tract: Adrenal glands are unremarkable. Kidneys are normal, without renal calculi, focal lesion, or hydronephrosis. Bladder is unremarkable. Stomach/Bowel: Patient is status post gastric surgery. Appendix appears normal. No evidence of bowel wall thickening, distention, or inflammatory changes. There are few scattered colonic diverticula. Vascular/Lymphatic: No significant vascular findings are present. No enlarged abdominal or pelvic lymph nodes. Reproductive: Uterus and bilateral adnexa are unremarkable. Other: No abdominal wall hernia or abnormality. No abdominopelvic ascites. Musculoskeletal: No acute or significant osseous findings. IMPRESSION: 1. No acute localizing process in the  abdomen or pelvis. 2. Vague hypodense lesion in the left lobe of the liver with some peripheral calcification. This is grossly unchanged compared to 2015, but indeterminate. Findings are favored as benign given stability. There is high clinical concern this can be further evaluated with an MRI. 3. Colonic diverticulosis without evidence for diverticulitis. Electronically Signed   By: Darliss Cheney M.D.   On: 05/28/2023 23:00   CT Angio Chest PE W and/or Wo Contrast Result Date: 05/28/2023 CLINICAL DATA:  Upper abdominal pain for 1 week. Nausea and vomiting. PE suspected. EXAM: CT ANGIOGRAPHY CHEST WITH CONTRAST TECHNIQUE: Multidetector CT imaging of the chest was performed using the standard protocol during bolus administration of intravenous contrast. Multiplanar CT image reconstructions and MIPs were obtained to evaluate the vascular anatomy. RADIATION DOSE REDUCTION: This exam was performed according to the departmental dose-optimization program which includes automated exposure control, adjustment of the mA and/or kV according to patient size and/or use of iterative reconstruction technique. CONTRAST:  OMNIPAQUE IOHEXOL 350 MG/ML SOLN COMPARISON:  Chest radiograph 05/21/2016 FINDINGS: Cardiovascular:  Negative for acute pulmonary embolism. Normal caliber thoracic aorta. No pericardial effusion. Mediastinum/Nodes: Trachea and esophagus are unremarkable. No thoracic adenopathy Lungs/Pleura: No focal consolidation, pleural effusion, or pneumothorax. Upper Abdomen: Reported separately. Musculoskeletal: No acute fracture. Review of the MIP images confirms the above findings. IMPRESSION: Negative for acute pulmonary embolism. No acute abnormality in the chest. Electronically Signed   By: Minerva Fester M.D.   On: 05/28/2023 22:56      IMPRESSION AND PLAN:  Assessment and Plan: * Chest pain - The patient will be admitted to an observation cardiac telemetry bed. - This was associated with syncope. -  Will follow serial troponins and EKGs. - The patient will be placed on aspirin as well as p.r.n. sublingual nitroglycerin and morphine sulfate for pain. - We will obtain a cardiology consult in a.m. for further cardiac risk stratification. - I notified [facility] group about the patient - We will check her orthostatics and monitor for arrhythmias given her syncope.   Hypokalemia - We will replace potassium and check magnesium level.  Hyponatremia - This is likely hypovolemic due to nausea and vomiting. - Will be hydrated with IV normal saline and will follow BMP.  Type 2 diabetes mellitus without complications (HCC) - The patient will be placed on supplement coverage with NovoLog.  GERD without esophagitis - We will continue PPI therapy.       DVT prophylaxis: Lovenox.  Advanced Care Planning:  Code Status: full code.  Family Communication:  The plan of care was discussed in details with the patient (and family). I answered all questions. The patient agreed to proceed with the above mentioned plan. Further management will depend upon hospital course. Disposition Plan: Back to previous home environment Consults called: Cardiology All the records are reviewed and case discussed with ED provider.  Status is: Observation   I certify that at the time of admission, it is my clinical judgment that the patient will require hospital care extending less than 2 midnights.                            Dispo: The patient is from: Home              Anticipated d/c is to: Home              Patient currently is not medically stable to d/c.              Difficult to place patient: No  Hannah Beat M.D on 05/29/2023 at 6:05 AM  [facility]   From 7 PM-7 AM, contact night-coverage [email or web address]  CC: Primary care physician; Excell Seltzer, MD

## 2023-05-30 DIAGNOSIS — R079 Chest pain, unspecified: Secondary | ICD-10-CM | POA: Diagnosis not present

## 2023-05-30 LAB — COMPREHENSIVE METABOLIC PANEL
ALT: 14 U/L (ref 0–44)
AST: 16 U/L (ref 15–41)
Albumin: 3.4 g/dL — ABNORMAL LOW (ref 3.5–5.0)
Alkaline Phosphatase: 41 U/L (ref 38–126)
Anion gap: 9 (ref 5–15)
BUN: 10 mg/dL (ref 6–20)
CO2: 24 mmol/L (ref 22–32)
Calcium: 8.4 mg/dL — ABNORMAL LOW (ref 8.9–10.3)
Chloride: 102 mmol/L (ref 98–111)
Creatinine, Ser: 0.66 mg/dL (ref 0.44–1.00)
GFR, Estimated: >60 mL/min (ref >60)
Glucose, Bld: 114 mg/dL — ABNORMAL HIGH (ref 70–99)
Potassium: 3.5 mmol/L (ref 3.5–5.1)
Sodium: 135 mmol/L (ref 135–145)
Total Bilirubin: 0.6 mg/dL (ref <1.2)
Total Protein: 6.5 g/dL (ref 6.5–8.1)

## 2023-05-30 LAB — CBC
HCT: 34.8 % — ABNORMAL LOW (ref 36.0–46.0)
Hemoglobin: 11.6 g/dL — ABNORMAL LOW (ref 12.0–15.0)
MCH: 25.4 pg — ABNORMAL LOW (ref 26.0–34.0)
MCHC: 33.3 g/dL (ref 30.0–36.0)
MCV: 76.1 fL — ABNORMAL LOW (ref 80.0–100.0)
Platelets: 295 10*3/uL (ref 150–400)
RBC: 4.57 MIL/uL (ref 3.87–5.11)
RDW: 14.8 % (ref 11.5–15.5)
WBC: 8.2 10*3/uL (ref 4.0–10.5)
nRBC: 0 % (ref 0.0–0.2)

## 2023-05-30 MED ORDER — OZEMPIC (0.25 OR 0.5 MG/DOSE) 2 MG/3ML ~~LOC~~ SOPN: 0.2500 mg | PEN_INJECTOR | SUBCUTANEOUS | Status: DC

## 2023-05-30 NOTE — ED Notes (Signed)
Provided with ice water per pt request.

## 2023-05-30 NOTE — Discharge Summary (Signed)
Physician Discharge Summary   [name] MRN: [id number]  DOB: [birth year]   Admit:     Date of Admission: 05/28/2023 Admitted from: home   Discharge: Date of discharge: 05/30/23 Disposition: Home Condition at discharge: good  CODE STATUS: FULL CODE     Discharge Physician: Sunnie Nielsen, DO [facility]     PCP: Excell Seltzer, MD  Recommendations for Outpatient Follow-up:  Follow up with PCP Ermalene Searing, Amy E, MD in 1-2 weeks Please obtain labs/tests: BMP in 1-2 weeks    Discharge Instructions     Diet - low sodium heart healthy   Complete by: As directed    Increase activity slowly   Complete by: As directed          Discharge Diagnoses: Principal Problem:   Chest pain Active Problems:   Hypokalemia   Hyponatremia   GERD without esophagitis   Type 2 diabetes mellitus without complications (HCC)   Elevated troponin   Primary hypertension       Hospital Course: [name] is a 37 y.o. female with medical history significant for type diabetes mellitus, GERD and hypertension, presented to the emergency room with acute onset of epigastric abdominal pain felt as a sharp pain and chest pain felt as tightness. Over the last week she has been having worsening upper abdominal pain greater on the right side with associated nausea and vomiting.  She has had a gastric sleeve and cholecystectomy in the past.  While in the waiting room she developed chest pain felt as tightness with associated dyspnea and had a witnessed syncopal episode when she was brought back to room for further evaluation. Elevated BP, tachycardic, significantly hypokalemic 2.9, CT abd/pelv and CTA chest no concerns. Ruled out for ACS, K repleted, Echo normal, nausea and pain resolved and pt requesting for discharge early AM 12/15.   Consultants: cardiology Procedures: none    Assessment/plan:  Chest pain - ACS ruled out Follow as needed   Recurrent nausea  and vomiting - resolved question in setting of Ozempic but she has been on this for some time  Question gastroparesis vs viral gastroenteritis Symptoms resolved Supportive care and f/u as needed  Hypokalemia - resolved Mild Hyponatremia - resolved Monitor BMP outpaitnet    Type 2 diabetes mellitus without complications Resume home Ozempic w/ outpatient f/u If nausea recurs onthis medicine would d/c    GERD without esophagitis PPI           Discharge Instructions  Allergies as of 05/30/2023       Reactions   Tomato Rash        Medication List     STOP taking these medications    metroNIDAZOLE 500 MG tablet Commonly known as: Flagyl   MULTIVITAMIN WOMEN PO       TAKE these medications    Nexplanon 68 MG Impl implant Generic drug: etonogestrel 1 each by Subdermal route once.   omeprazole 40 MG capsule Commonly known as: PRILOSEC TAKE 1 CAPSULE(40 MG) BY MOUTH DAILY   Ozempic (0.25 or 0.5 MG/DOSE) 2 MG/3ML Sopn Generic drug: Semaglutide(0.25 or 0.5MG /DOS) Inject 0.25 mg into the skin once a week. If nausea/vomiting, please discontinue this medicine and discuss with outpatient prescriber What changed: additional instructions          Allergies  Allergen Reactions   Tomato Rash     Subjective: pt reports feeling great this morning, ambulating independently, no concerns    Discharge Exam: BP Marland Kitchen)  146/108   Pulse 73   Temp 98.8 F (37.1 C) (Oral)   Resp 15   Ht 5\' 6"  (1.676 m)   Wt 89.6 kg   SpO2 100%   BMI 31.89 kg/m  General: Pt is alert, awake, not in acute distress Cardiovascular: RRR, S1/S2 +, no rubs, no gallops Respiratory: CTA bilaterally, no wheezing, no rhonchi Abdominal: Soft, NT, ND, bowel sounds + Extremities: no edema, no cyanosis     The results of significant diagnostics from this hospitalization (including imaging, microbiology, ancillary and laboratory) are listed below for reference.     Microbiology: No  results found for this or any previous visit (from the past 240 hours).   Labs: BNP (last 3 results) No results for input(s): "BNP" in the last 8760 hours. Basic Metabolic Panel: Recent Labs  Lab 05/26/23 1516 05/28/23 1612 05/28/23 2326 05/29/23 0602 05/30/23 0532  NA 137 134*  --  137 135  K 3.4* 2.9*  --  3.6 3.5  CL 103 95*  --  104 102  CO2 25 21*  --  23 24  GLUCOSE 120* 146*  --  103* 114*  BUN 7 11  --  11 10  CREATININE 0.79 0.75  --  0.80 0.66  CALCIUM 9.2 9.2  --  8.8* 8.4*  MG  --   --  1.9  --   --    Liver Function Tests: Recent Labs  Lab 05/26/23 1516 05/28/23 1612 05/30/23 0532  AST 16 18 16   ALT 16 18 14   ALKPHOS 47 58 41  BILITOT 0.6 0.9 0.6  PROT 7.7 8.6* 6.5  ALBUMIN 4.1 4.4 3.4*   Recent Labs  Lab 05/26/23 1516 05/28/23 1612  LIPASE 23 25   No results for input(s): "AMMONIA" in the last 168 hours. CBC: Recent Labs  Lab 05/26/23 1516 05/28/23 1612 05/29/23 0602 05/30/23 0532  WBC 9.0 13.6* 13.2* 8.2  HGB 12.8 15.2* 12.7 11.6*  HCT 38.9 44.6 37.7 34.8*  MCV 76.9* 74.3* 74.8* 76.1*  PLT 325 429* 346 295   Cardiac Enzymes: No results for input(s): "CKTOTAL", "CKMB", "CKMBINDEX", "TROPONINI" in the last 168 hours. BNP: Invalid input(s): "POCBNP" CBG: Recent Labs  Lab 05/29/23 0847 05/29/23 1248 05/29/23 1644 05/29/23 2117  GLUCAP 128* 124* 102* 85   D-Dimer No results for input(s): "DDIMER" in the last 72 hours. Hgb A1c No results for input(s): "HGBA1C" in the last 72 hours. Lipid Profile No results for input(s): "CHOL", "HDL", "LDLCALC", "TRIG", "CHOLHDL", "LDLDIRECT" in the last 72 hours. Thyroid function studies No results for input(s): "TSH", "T4TOTAL", "T3FREE", "THYROIDAB" in the last 72 hours.  Invalid input(s): "FREET3" Anemia work up No results for input(s): "VITAMINB12", "FOLATE", "FERRITIN", "TIBC", "IRON", "RETICCTPCT" in the last 72 hours. Urinalysis    Component Value Date/Time   COLORURINE STRAW (A)  05/28/2023 2305   APPEARANCEUR CLEAR (A) 05/28/2023 2305   APPEARANCEUR Clear 06/11/2014 1155   LABSPEC >1.046 (H) 05/28/2023 2305   LABSPEC 1.010 06/11/2014 1155   PHURINE 5.0 05/28/2023 2305   GLUCOSEU NEGATIVE 05/28/2023 2305   GLUCOSEU >=500 06/11/2014 1155   HGBUR SMALL (A) 05/28/2023 2305   BILIRUBINUR NEGATIVE 05/28/2023 2305   BILIRUBINUR Negative 12/11/2021 1041   BILIRUBINUR Negative 06/11/2014 1155   KETONESUR 20 (A) 05/28/2023 2305   PROTEINUR NEGATIVE 05/28/2023 2305   UROBILINOGEN 0.2 12/11/2021 1041   NITRITE NEGATIVE 05/28/2023 2305   LEUKOCYTESUR NEGATIVE 05/28/2023 2305   LEUKOCYTESUR 1+ 06/11/2014 1155   Sepsis Labs Recent Labs  Lab 05/26/23 1516 05/28/23 1612 05/29/23 0602 05/30/23 0532  WBC 9.0 13.6* 13.2* 8.2   Microbiology No results found for this or any previous visit (from the past 240 hours). Imaging ECHOCARDIOGRAM COMPLETE Result Date: 05/29/2023    ECHOCARDIOGRAM REPORT   Patient Name:   DAIJANAE ANGELOPOULOS Date of Exam: 05/29/2023 Medical Rec #:  [id number]       Height:       66.0 in Accession #:    [id number]      Weight:       197.6 lb Date of Birth:  1985-12-12      BSA:          1.990 m Patient Age:    37 years        BP:           110/80 mmHg Patient Gender: F               HR:           69 bpm. Exam Location:  [facility] Procedure: 2D Echo and Strain Analysis Indications:     Chest Pain R07.9  History:         Patient has no prior history of Echocardiogram examinations.  Sonographer:     Overton Mam RDCS, FASE Referring Phys:  [id number] Debbe Odea Diagnosing Phys: Debbe Odea MD  Sonographer Comments: Global longitudinal strain was attempted. IMPRESSIONS  1. Left ventricular ejection fraction, by estimation, is 60 to 65%. Left ventricular ejection fraction by PLAX is 63 %. The left ventricle has normal function. The left ventricle has no regional wall motion abnormalities. Left ventricular diastolic parameters were normal. The average left  ventricular global longitudinal strain is -16.7 %. The global longitudinal strain is normal.  2. Right ventricular systolic function is normal. The right ventricular size is normal.  3. The mitral valve is normal in structure. No evidence of mitral valve regurgitation.  4. The aortic valve is tricuspid. Aortic valve regurgitation is not visualized.  5. The inferior vena cava is normal in size with greater than 50% respiratory variability, suggesting right atrial pressure of 3 mmHg. FINDINGS  Left Ventricle: Left ventricular ejection fraction, by estimation, is 60 to 65%. Left ventricular ejection fraction by PLAX is 63 %. The left ventricle has normal function. The left ventricle has no regional wall motion abnormalities. The average left ventricular global longitudinal strain is -16.7 %. The global longitudinal strain is normal. The left ventricular internal cavity size was normal in size. There is no left ventricular hypertrophy. Left ventricular diastolic parameters were normal. Right Ventricle: The right ventricular size is normal. No increase in right ventricular wall thickness. Right ventricular systolic function is normal. Left Atrium: Left atrial size was normal in size. Right Atrium: Right atrial size was normal in size. Pericardium: There is no evidence of pericardial effusion. Mitral Valve: The mitral valve is normal in structure. No evidence of mitral valve regurgitation. Tricuspid Valve: The tricuspid valve is normal in structure. Tricuspid valve regurgitation is not demonstrated. Aortic Valve: The aortic valve is tricuspid. Aortic valve regurgitation is not visualized. Aortic valve peak gradient measures 6.2 mmHg. Pulmonic Valve: The pulmonic valve was normal in structure. Pulmonic valve regurgitation is trivial. Aorta: The aortic root and ascending aorta are structurally normal, with no evidence of dilitation. Venous: The inferior vena cava is normal in size with greater than 50% respiratory  variability, suggesting right atrial pressure of 3 mmHg. IAS/Shunts: No atrial level shunt detected by color flow Doppler.  LEFT VENTRICLE PLAX 2D LV EF:         Left            Diastology                ventricular     LV e' medial:    8.92 cm/s                ejection        LV E/e' medial:  7.5                fraction by     LV e' lateral:   12.00 cm/s                PLAX is 63      LV E/e' lateral: 5.6                %. LVIDd:         3.90 cm         2D LVIDs:         2.60 cm         Longitudinal LV PW:         1.20 cm         Strain LV IVS:        1.00 cm         2D Strain GLS  -16.7 % LVOT diam:     1.80 cm         Avg: LV SV:         43 LV SV Index:   22 LVOT Area:     2.54 cm  RIGHT VENTRICLE RV Basal diam:  2.40 cm RV S prime:     12.40 cm/s TAPSE (M-mode): 1.7 cm LEFT ATRIUM             Index        RIGHT ATRIUM           Index LA diam:        2.90 cm 1.46 cm/m   Cassidy Area:     11.30 cm LA Vol (A2C):   34.3 ml 17.24 ml/m  Cassidy Volume:   23.90 ml  12.01 ml/m LA Vol (A4C):   30.5 ml 15.33 ml/m LA Biplane Vol: 32.7 ml 16.44 ml/m  AORTIC VALVE                 PULMONIC VALVE AV Area (Vmax): 1.93 cm     PV Vmax:        1.14 m/s AV Vmax:        124.00 cm/s  PV Peak grad:   5.2 mmHg AV Peak Grad:   6.2 mmHg     RVOT Peak grad: 4 mmHg LVOT Vmax:      94.20 cm/s LVOT Vmean:     60.900 cm/s LVOT VTI:       0.170 m  AORTA Ao Root diam: 2.90 cm Ao Asc diam:  2.50 cm MITRAL VALVE MV Area (PHT): 3.60 cm    SHUNTS MV Decel Time: 211 msec    Systemic VTI:  0.17 m MV E velocity: 67.10 cm/s  Systemic Diam: 1.80 cm MV A velocity: 53.50 cm/s MV E/A ratio:  1.25 Debbe Odea MD Electronically signed by Debbe Odea MD Signature Date/Time: 05/29/2023/2:10:28 PM    Final       Time coordinating discharge: over 30 minutes  SIGNED:  Sunnie Nielsen DO [facility]

## 2023-05-30 NOTE — ED Notes (Signed)
Pt eating breakfast tray 

## 2023-05-30 NOTE — ED Notes (Addendum)
Call light notification went off bc the cord was disconnected, went to pts room and reconnected, pt was unaware she accidentally disconnected it.

## 2023-05-31 LAB — HEMOGLOBIN A1C
Hgb A1c MFr Bld: 6 % — ABNORMAL HIGH (ref 4.8–5.6)
Mean Plasma Glucose: 126 mg/dL

## 2023-06-01 ENCOUNTER — Encounter: Payer: Self-pay | Admitting: Physician Assistant

## 2023-06-01 ENCOUNTER — Encounter: Payer: Self-pay | Admitting: Family Medicine

## 2023-06-01 ENCOUNTER — Ambulatory Visit (INDEPENDENT_AMBULATORY_CARE_PROVIDER_SITE_OTHER): Payer: BC Managed Care – PPO | Admitting: Family Medicine

## 2023-06-01 VITALS — BP 128/84 | HR 72 | Temp 98.3°F | Ht 66.0 in | Wt 202.0 lb

## 2023-06-01 DIAGNOSIS — E876 Hypokalemia: Secondary | ICD-10-CM

## 2023-06-01 DIAGNOSIS — K920 Hematemesis: Secondary | ICD-10-CM | POA: Insufficient documentation

## 2023-06-01 DIAGNOSIS — R1013 Epigastric pain: Secondary | ICD-10-CM

## 2023-06-01 DIAGNOSIS — R7989 Other specified abnormal findings of blood chemistry: Secondary | ICD-10-CM | POA: Diagnosis not present

## 2023-06-01 DIAGNOSIS — R000 Tachycardia, unspecified: Secondary | ICD-10-CM | POA: Insufficient documentation

## 2023-06-01 LAB — CBC WITH DIFFERENTIAL/PLATELET
Basophils Absolute: 0 10*3/uL (ref 0.0–0.1)
Basophils Relative: 0.6 % (ref 0.0–3.0)
Eosinophils Absolute: 0.1 10*3/uL (ref 0.0–0.7)
Eosinophils Relative: 1.6 % (ref 0.0–5.0)
HCT: 37.6 % (ref 36.0–46.0)
Hemoglobin: 12.1 g/dL (ref 12.0–15.0)
Lymphocytes Relative: 26.5 % (ref 12.0–46.0)
Lymphs Abs: 1.7 10*3/uL (ref 0.7–4.0)
MCHC: 32.1 g/dL (ref 30.0–36.0)
MCV: 79 fL (ref 78.0–100.0)
Monocytes Absolute: 0.7 10*3/uL (ref 0.1–1.0)
Monocytes Relative: 10.4 % (ref 3.0–12.0)
Neutro Abs: 4 10*3/uL (ref 1.4–7.7)
Neutrophils Relative %: 60.9 % (ref 43.0–77.0)
Platelets: 299 10*3/uL (ref 150.0–400.0)
RBC: 4.76 Mil/uL (ref 3.87–5.11)
RDW: 15.7 % — ABNORMAL HIGH (ref 11.5–15.5)
WBC: 6.5 10*3/uL (ref 4.0–10.5)

## 2023-06-01 LAB — BASIC METABOLIC PANEL
BUN: 8 mg/dL (ref 6–23)
CO2: 31 meq/L (ref 19–32)
Calcium: 8.8 mg/dL (ref 8.4–10.5)
Chloride: 102 meq/L (ref 96–112)
Creatinine, Ser: 0.75 mg/dL (ref 0.40–1.20)
GFR: 103.35 mL/min (ref >60.00)
Glucose, Bld: 80 mg/dL (ref 70–99)
Potassium: 3.6 meq/L (ref 3.5–5.1)
Sodium: 137 meq/L (ref 135–145)

## 2023-06-01 NOTE — Assessment & Plan Note (Signed)
Secondary to emesis and decreased p.o. intake.  Now emesis resolved and improved p.o. intake.  Reevaluate today.

## 2023-06-01 NOTE — Progress Notes (Signed)
Patient ID: [name], female    DOB: 1986-01-19, 37 y.o.   MRN: [id number]  This visit was conducted in person.  BP 128/84   Pulse 72   Temp 98.3 F (36.8 C) (Temporal)   Ht 5\' 6"  (1.676 m)   Wt 202 lb (91.6 kg)   SpO2 97%   BMI 32.60 kg/m    CC:  Chief Complaint  Patient presents with   Hospitalization Follow-up    Subjective:   HPI: [name] is a 37 y.o. female patient with history of sleeve gastrectomy presenting on 06/01/2023 for Hospitalization Follow-up     Recent ED visit, sent from this office 05/28/2023 Admitted for nausea.vomiting, epigastric pain, chest pain, syncopal spell  Noted to be tachycardiac hyper tensive Labs with leukocytosis at 13.6. Labs notable for hypokalemia with K of 2.9. Troponin from initial blood work slightly elevated at 23, repeat did uptrend to 43. Urine without evidence of infection. CT scans without significant findings. On reevaluation after fluids, morphine, Zofran, patient does feel much improved, however with her chest pain and increasing troponin, admitted.  Chest CT: no PE.  Cardiology consulted  ECHO:EF 60-65% Discharge summary not yet prepared.  Hyperkalemia and leukocytosis resolved at discharge, troponin down trending.    Had similar pain 11/30 intermittent abdominal pain, epigastric, radiated right and down.  Reviewed recent ED visit on 05/26/2023  New onset abdominal pain x 3 days. 10/10  Nausea, vomiting now started in last 3 days  Saw bright red blood in emesis this AM.... significant amount  No BM in last 3 days  Very lightheaded, feels pre Has 8 oz pedialyte and water.   No sick contacts.   BP Readings from Last 3 Encounters:  06/01/23 128/84  05/30/23 (!) 146/108  05/28/23 (!) 174/114    History of sleeve gastrectomy and cholecystectomy    Has not taken ozempic  in last week. Has been on for year.   She does take ibuprofen a lot.  Prilosec controls daily  heartburn.   Since discharge   Now keeping down po intake. Eating smoothies  No more vomiting.  Relevant past medical, surgical, family and social history reviewed and updated as indicated. Interim medical history since our last visit reviewed. Allergies and medications reviewed and updated. Outpatient Medications Prior to Visit  Medication Sig Dispense Refill   etonogestrel (NEXPLANON) 68 MG IMPL implant 1 each by Subdermal route once.     omeprazole (PRILOSEC) 40 MG capsule TAKE 1 CAPSULE(40 MG) BY MOUTH DAILY 90 capsule 0   Semaglutide,0.25 or 0.5MG /DOS, (OZEMPIC, 0.25 OR 0.5 MG/DOSE,) 2 MG/3ML SOPN Inject 0.25 mg into the skin once a week. If nausea/vomiting, please discontinue this medicine and discuss with outpatient prescriber     No facility-administered medications prior to visit.     Per HPI unless specifically indicated in ROS section below Review of Systems  Constitutional:  Negative for fatigue and fever.  HENT:  Negative for congestion.   Eyes:  Negative for pain.  Respiratory:  Negative for cough and shortness of breath.   Cardiovascular:  Negative for chest pain, palpitations and leg swelling.  Gastrointestinal:  Negative for abdominal pain, nausea, rectal pain and vomiting.  Genitourinary:  Negative for dysuria and vaginal bleeding.  Musculoskeletal:  Negative for back pain.  Neurological:  Negative for dizziness, syncope, weakness, light-headedness and headaches.       Hands cramping  Psychiatric/Behavioral:  Negative for dysphoric mood.    Objective:  BP 128/84  Pulse 72   Temp 98.3 F (36.8 C) (Temporal)   Ht 5\' 6"  (1.676 m)   Wt 202 lb (91.6 kg)   SpO2 97%   BMI 32.60 kg/m   Wt Readings from Last 3 Encounters:  06/01/23 202 lb (91.6 kg)  05/28/23 197 lb 9.6 oz (89.6 kg)  05/28/23 197 lb (89.4 kg)      Physical Exam Constitutional:      General: She is not in acute distress.    Appearance: She is not toxic-appearing or diaphoretic.  HENT:     Mouth/Throat:     Mouth: Mucous  membranes are dry.  Cardiovascular:     Rate and Rhythm: Tachycardia present.  Abdominal:     General: Bowel sounds are decreased. There is no distension.     Palpations: There is no mass.     Tenderness: There is no abdominal tenderness. There is no right CVA tenderness or left CVA tenderness.       Results for orders placed or performed during the hospital encounter of 05/28/23  Lipase, blood   Collection Time: 05/28/23  4:12 PM  Result Value Ref Range   Lipase 25 11 - 51 U/L  Comprehensive metabolic panel   Collection Time: 05/28/23  4:12 PM  Result Value Ref Range   Sodium 134 (L) 135 - 145 mmol/L   Potassium 2.9 (L) 3.5 - 5.1 mmol/L   Chloride 95 (L) 98 - 111 mmol/L   CO2 21 (L) 22 - 32 mmol/L   Glucose, Bld 146 (H) 70 - 99 mg/dL   BUN 11 6 - 20 mg/dL   Creatinine, Ser 7.82 0.44 - 1.00 mg/dL   Calcium 9.2 8.9 - 95.6 mg/dL   Total Protein 8.6 (H) 6.5 - 8.1 g/dL   Albumin 4.4 3.5 - 5.0 g/dL   AST 18 15 - 41 U/L   ALT 18 0 - 44 U/L   Alkaline Phosphatase 58 38 - 126 U/L   Total Bilirubin 0.9 <1.2 mg/dL   GFR, Estimated >21 >30 mL/min   Anion gap 14 5 - 15  CBC   Collection Time: 05/28/23  4:12 PM  Result Value Ref Range   WBC 13.6 (H) 4.0 - 10.5 K/uL   RBC 6.00 (H) 3.87 - 5.11 MIL/uL   Hemoglobin 15.2 (H) 12.0 - 15.0 g/dL   HCT 86.5 78.4 - 69.6 %   MCV 74.3 (L) 80.0 - 100.0 fL   MCH 25.3 (L) 26.0 - 34.0 pg   MCHC 34.1 30.0 - 36.0 g/dL   RDW 29.5 28.4 - 13.2 %   Platelets 429 (H) 150 - 400 K/uL   nRBC 0.0 0.0 - 0.2 %  hCG, quantitative, pregnancy   Collection Time: 05/28/23  4:12 PM  Result Value Ref Range   hCG, Beta Chain, Quant, S 2 <5 mIU/mL  Troponin I (High Sensitivity)   Collection Time: 05/28/23  4:12 PM  Result Value Ref Range   Troponin I (High Sensitivity) 23 (H) <18 ng/L  Urinalysis, Routine w reflex microscopic -Urine, Random   Collection Time: 05/28/23 11:05 PM  Result Value Ref Range   Color, Urine STRAW (A) YELLOW   APPearance CLEAR (A)  CLEAR   Specific Gravity, Urine >1.046 (H) 1.005 - 1.030   pH 5.0 5.0 - 8.0   Glucose, UA NEGATIVE NEGATIVE mg/dL   Hgb urine dipstick SMALL (A) NEGATIVE   Bilirubin Urine NEGATIVE NEGATIVE   Ketones, ur 20 (A) NEGATIVE mg/dL   Protein, ur NEGATIVE NEGATIVE  mg/dL   Nitrite NEGATIVE NEGATIVE   Leukocytes,Ua NEGATIVE NEGATIVE   RBC / HPF 0-5 0 - 5 RBC/hpf   WBC, UA 0-5 0 - 5 WBC/hpf   Bacteria, UA NONE SEEN NONE SEEN   Squamous Epithelial / HPF 0-5 0 - 5 /HPF   Mucus PRESENT   POC urine preg, ED   Collection Time: 05/28/23 11:21 PM  Result Value Ref Range   Preg Test, Ur Negative Negative  Troponin I (High Sensitivity)   Collection Time: 05/28/23 11:22 PM  Result Value Ref Range   Troponin I (High Sensitivity) 43 (H) <18 ng/L  Magnesium   Collection Time: 05/28/23 11:26 PM  Result Value Ref Range   Magnesium 1.9 1.7 - 2.4 mg/dL  Basic metabolic panel   Collection Time: 05/29/23  6:02 AM  Result Value Ref Range   Sodium 137 135 - 145 mmol/L   Potassium 3.6 3.5 - 5.1 mmol/L   Chloride 104 98 - 111 mmol/L   CO2 23 22 - 32 mmol/L   Glucose, Bld 103 (H) 70 - 99 mg/dL   BUN 11 6 - 20 mg/dL   Creatinine, Ser 1.61 0.44 - 1.00 mg/dL   Calcium 8.8 (L) 8.9 - 10.3 mg/dL   GFR, Estimated >09 >60 mL/min   Anion gap 10 5 - 15  CBC   Collection Time: 05/29/23  6:02 AM  Result Value Ref Range   WBC 13.2 (H) 4.0 - 10.5 K/uL   RBC 5.04 3.87 - 5.11 MIL/uL   Hemoglobin 12.7 12.0 - 15.0 g/dL   HCT 45.4 09.8 - 11.9 %   MCV 74.8 (L) 80.0 - 100.0 fL   MCH 25.2 (L) 26.0 - 34.0 pg   MCHC 33.7 30.0 - 36.0 g/dL   RDW 14.7 82.9 - 56.2 %   Platelets 346 150 - 400 K/uL   nRBC 0.0 0.0 - 0.2 %  Hemoglobin A1c   Collection Time: 05/29/23  6:02 AM  Result Value Ref Range   Hgb A1c MFr Bld 6.0 (H) 4.8 - 5.6 %   Mean Plasma Glucose 126 mg/dL  Troponin I (High Sensitivity)   Collection Time: 05/29/23  6:02 AM  Result Value Ref Range   Troponin I (High Sensitivity) 25 (H) <18 ng/L  CBG  monitoring, ED   Collection Time: 05/29/23  8:47 AM  Result Value Ref Range   Glucose-Capillary 128 (H) 70 - 99 mg/dL  CBG monitoring, ED   Collection Time: 05/29/23 12:48 PM  Result Value Ref Range   Glucose-Capillary 124 (H) 70 - 99 mg/dL  ECHOCARDIOGRAM COMPLETE   Collection Time: 05/29/23  1:49 PM  Result Value Ref Range   Weight 3,161.6 oz   Height 66 in   BP 110/80 mmHg   Ao pk vel 1.24 m/s   AR max vel 1.93 cm2   AV Peak grad 6.2 mmHg   S' Lateral 2.60 cm   Area-P 1/2 3.60 cm2   Est EF 60 - 65%   CBG monitoring, ED   Collection Time: 05/29/23  4:44 PM  Result Value Ref Range   Glucose-Capillary 102 (H) 70 - 99 mg/dL  CBG monitoring, ED   Collection Time: 05/29/23  9:17 PM  Result Value Ref Range   Glucose-Capillary 85 70 - 99 mg/dL  CBC   Collection Time: 05/30/23  5:32 AM  Result Value Ref Range   WBC 8.2 4.0 - 10.5 K/uL   RBC 4.57 3.87 - 5.11 MIL/uL   Hemoglobin 11.6 (L) 12.0 -  15.0 g/dL   HCT 84.1 (L) 32.4 - 40.1 %   MCV 76.1 (L) 80.0 - 100.0 fL   MCH 25.4 (L) 26.0 - 34.0 pg   MCHC 33.3 30.0 - 36.0 g/dL   RDW 02.7 25.3 - 66.4 %   Platelets 295 150 - 400 K/uL   nRBC 0.0 0.0 - 0.2 %  Comprehensive metabolic panel   Collection Time: 05/30/23  5:32 AM  Result Value Ref Range   Sodium 135 135 - 145 mmol/L   Potassium 3.5 3.5 - 5.1 mmol/L   Chloride 102 98 - 111 mmol/L   CO2 24 22 - 32 mmol/L   Glucose, Bld 114 (H) 70 - 99 mg/dL   BUN 10 6 - 20 mg/dL   Creatinine, Ser 4.03 0.44 - 1.00 mg/dL   Calcium 8.4 (L) 8.9 - 10.3 mg/dL   Total Protein 6.5 6.5 - 8.1 g/dL   Albumin 3.4 (L) 3.5 - 5.0 g/dL   AST 16 15 - 41 U/L   ALT 14 0 - 44 U/L   Alkaline Phosphatase 41 38 - 126 U/L   Total Bilirubin 0.6 <1.2 mg/dL   GFR, Estimated >47 >42 mL/min   Anion gap 9 5 - 15    Assessment and Plan  Epigastric pain Assessment & Plan: Acute, concern for possible ulcer versus gastritis versus side effect to Ozempic.  Normal lipase. Given bloody emesis and severe pain  with recent use of ibuprofen, I am concerned for peptic ulcer disease.  She will continue Prilosec 40 mg daily, she has stopped ibuprofen and other NSAIDs.  Will refer urgently to GI for endoscopy evaluation. Will have her stay off Ozempic until GI evaluation, but of note she was tolerating this well for the past year without issues.  Orders: -     Ambulatory referral to Gastroenterology  Tachycardia Assessment & Plan: Acute, now resolved.  Likely secondary to pain and dehydration.   Elevated troponin Assessment & Plan: Acute, improved at discharge.  Echocardiogram within normal limits.  Most likely secondary to heart strain with tachycardia and pain.   Hematemesis with nausea Assessment & Plan: Reevaluate with CBC, no further episodes.  Orders: -     Ambulatory referral to Gastroenterology -     CBC with Differential/Platelet  Hypokalemia Assessment & Plan: Secondary to emesis and decreased p.o. intake.  Now emesis resolved and improved p.o. intake.  Reevaluate today.  Orders: -     Basic metabolic panel     No follow-ups on file.   Kerby Nora, MD

## 2023-06-01 NOTE — Assessment & Plan Note (Signed)
Acute, concern for possible ulcer versus gastritis versus side effect to Ozempic.  Normal lipase. Given bloody emesis and severe pain with recent use of ibuprofen, I am concerned for peptic ulcer disease.  She will continue Prilosec 40 mg daily, she has stopped ibuprofen and other NSAIDs.  Will refer urgently to GI for endoscopy evaluation. Will have her stay off Ozempic until GI evaluation, but of note she was tolerating this well for the past year without issues.

## 2023-06-01 NOTE — Patient Instructions (Addendum)
Avoid ibuprofen.. use tylenol instead.  Continue Prilosec daily.  Hold ozempic for now.  Advance diet to bland foods, continue piushing fluids.

## 2023-06-01 NOTE — Assessment & Plan Note (Signed)
Acute, now resolved.  Likely secondary to pain and dehydration.

## 2023-06-01 NOTE — Assessment & Plan Note (Signed)
Reevaluate with CBC, no further episodes.

## 2023-06-01 NOTE — Assessment & Plan Note (Signed)
Acute, improved at discharge.  Echocardiogram within normal limits.  Most likely secondary to heart strain with tachycardia and pain.

## 2023-06-03 ENCOUNTER — Other Ambulatory Visit: Payer: Self-pay | Admitting: Family Medicine

## 2023-06-03 ENCOUNTER — Encounter: Payer: Self-pay | Admitting: Internal Medicine

## 2023-06-03 ENCOUNTER — Ambulatory Visit (INDEPENDENT_AMBULATORY_CARE_PROVIDER_SITE_OTHER): Payer: BC Managed Care – PPO | Admitting: Internal Medicine

## 2023-06-03 ENCOUNTER — Encounter: Payer: Self-pay | Admitting: Family Medicine

## 2023-06-03 VITALS — BP 122/70 | HR 75 | Ht 66.0 in | Wt 205.0 lb

## 2023-06-03 DIAGNOSIS — Z8719 Personal history of other diseases of the digestive system: Secondary | ICD-10-CM | POA: Diagnosis not present

## 2023-06-03 DIAGNOSIS — K449 Diaphragmatic hernia without obstruction or gangrene: Secondary | ICD-10-CM | POA: Diagnosis not present

## 2023-06-03 DIAGNOSIS — K219 Gastro-esophageal reflux disease without esophagitis: Secondary | ICD-10-CM | POA: Diagnosis not present

## 2023-06-03 NOTE — Progress Notes (Signed)
Chief Complaint: Abdominal pain  HPI : 37 year old female with history of DM, GERD, gastric sleeve surgery in 2016, and headaches presents with abdominal pain  She has been on Ozempic for the last year, but then she developed ab pain last month and was hospitalized for 3 days. She was vomiting red blood, and there was concern that her Ozempic may be contributing to her symptoms. However, her Ozempic dosage has not changed over that period of time. She was already on omeprazole 40 mg every day. She was using 3-4 ibuprofen per day prior to the hospitalization, but then she stopped taking all NSAIDs. Her pain has gotten better since that hospitalization. Denies N&V. She has 1 Bm per week, which is normal for her. Denies rectal bleeding or rectal pain. Denies chest burning or regurgitation while she is on the omperazole. Her blood sugars are under good control. Denies dysphgaia or odynophagia. Mother had IBS. Denies prior colonoscopy. Denies marijuana.   Past Medical History:  Diagnosis Date   Diabetes mellitus without complication (HCC)    before gastric sleeve procedure   GERD (gastroesophageal reflux disease)    Headache    when BP is up   Hypertension    Wears contact lenses    Past Surgical History:  Procedure Laterality Date   CESAREAN SECTION  04/30/2009   decreased fetal heart tones due to umbilical cord wrapped   CHOLECYSTECTOMY     ESOPHAGOGASTRODUODENOSCOPY (EGD) WITH PROPOFOL N/A 10/24/2015   Procedure: ESOPHAGOGASTRODUODENOSCOPY (EGD) WITH PROPOFOL;  Surgeon: Midge Minium, MD;  Location: [facility];  Service: Endoscopy;  Laterality: N/A;   LAPAROSCOPIC GASTRIC SLEEVE RESECTION N/A 03/19/2015   Procedure: LAPAROSCOPIC GASTRIC SLEEVE RESECTION;  Surgeon: Everette Rank, MD;  Location: [facility];  Service: General;  Laterality: N/A;   Family History  Problem Relation Age of Onset   Diabetes Father    Hypertension Father    Hyperlipidemia Father    Hypertension Sister     Birth defects Sister        fluid on brain cause blindness in B eye   Breast cancer Maternal Aunt 30       has contact   Breast cancer Maternal Aunt 30       has contact   Prostate cancer Paternal Grandfather    Breast cancer Cousin 30       has contact   Social History   Tobacco Use   Smoking status: Never   Smokeless tobacco: Never  Vaping Use   Vaping status: Former  Substance Use Topics   Alcohol use: Yes    Comment: 1 drink/mo   Drug use: No   Current Outpatient Medications  Medication Sig Dispense Refill   etonogestrel (NEXPLANON) 68 MG IMPL implant 1 each by Subdermal route once.     omeprazole (PRILOSEC) 40 MG capsule TAKE 1 CAPSULE(40 MG) BY MOUTH DAILY 90 capsule 0   Semaglutide,0.25 or 0.5MG /DOS, (OZEMPIC, 0.25 OR 0.5 MG/DOSE,) 2 MG/3ML SOPN Inject 0.25 mg into the skin once a week. If nausea/vomiting, please discontinue this medicine and discuss with outpatient prescriber (Patient not taking: Reported on 06/03/2023)     No current facility-administered medications for this visit.   Allergies  Allergen Reactions   Tomato Rash     Review of Systems: All systems reviewed and negative except where noted in HPI.   Physical Exam: BP 122/70   Pulse 75   Ht 5\' 6"  (1.676 m)   Wt 205 lb (93 kg)  BMI 33.09 kg/m  Constitutional: Pleasant,well-developed, female in no acute distress. HEENT: Normocephalic and atraumatic. Conjunctivae are normal. No scleral icterus. Cardiovascular: Normal rate, regular rhythm.  Pulmonary/chest: Effort normal and breath sounds normal. No wheezing, rales or rhonchi. Abdominal: Soft, nondistended, nontender. Bowel sounds active throughout. There are no masses palpable. No hepatomegaly. Extremities: No edema Neurological: Alert and oriented to person place and time. Skin: Skin is warm and dry. No rashes noted. Psychiatric: Normal mood and affect. Behavior is normal.  Labs 05/2023: CBC unremarkable. CMP unremarkable.   CT A/P  w/contrast 05/28/23: IMPRESSION: 1. No acute localizing process in the abdomen or pelvis. 2. Vague hypodense lesion in the left lobe of the liver with some peripheral calcification. This is grossly unchanged compared to 2015, but indeterminate. Findings are favored as benign given stability. There is high clinical concern this can be further evaluated with an MRI. 3. Colonic diverticulosis without evidence for diverticulitis.  EGD 10/24/15: - Medium- sized hiatal hernia. - LA Grade C reflux esophagitis. - Post- surgical deformity in the gastric body. Biopsied. - Normal examined duodenum. - Gastric sleeve surgery. Path: Chronic inactive gastritis. Negative for H pylori.   ASSESSMENT AND PLAN: GERD History of esophagitis Hiatal hernia Epigastric ab pain - resolved History of gastric sleeve Patient presents after recent hospitalization for nausea and vomiting that may have been due to GERD and/or PPI.  There is also some concern for contribution from her Ozempic and/or gastroparesis.  However her blood sugars have been under good control, and her Ozempic has not changed dose since she started on it a year ago.  Patient was taking a lot of NSAIDs prior to her recent hospitalization and has now stopped all NSAID use.  She has had resolution of her abdominal pain as well as nausea and vomiting since that hospitalization.  Her last EGD in 2017 showed grade C esophagitis and a medium size hiatal hernia.  Patient is currently at risk for having uncontrolled acid reflux, though she states that her symptoms are currently under good control on daily omeprazole therapy.  With complete resolution of her abdominal pain as well as nausea and vomiting, I think it would be okay to restart her Ozempic for now.  I encouraged the patient to continue to avoid NSAIDs in the future.  Patient should maintain her omeprazole use.  Patient is not interested in an EGD at this time. If the patient were to have recurrent GI  symptoms in the future, we could consider performing EGD at that time. - Cont omeprazole - Avoid NSAIDs - Okay to restart Ozempic - RTC PRN  Eulah Pont, MD  I spent 47 minutes of time, including in depth chart review, independent review of results as outlined above, communicating results with the patient directly, face-to-face time with the patient, coordinating care, ordering studies and medications as appropriate, and documentation.

## 2023-06-03 NOTE — Patient Instructions (Addendum)
Continue taking Omeprazole  Avoid NSAID'S    If your blood pressure at your visit was 140/90 or greater, please contact your primary care physician to follow up on this.  _______________________________________________________  If you are age 35 or older, your body mass index should be between 23-30. Your Body mass index is 33.09 kg/m. If this is out of the aforementioned range listed, please consider follow up with your Primary Care Provider.  If you are age 49 or younger, your body mass index should be between 19-25. Your Body mass index is 33.09 kg/m. If this is out of the aformentioned range listed, please consider follow up with your Primary Care Provider.   ________________________________________________________  The [facility] providers would like to encourage you to use [facility] to communicate with providers for non-urgent requests or questions.  Due to long hold times on the telephone, sending your provider a message by [facility] may be a faster and more efficient way to get a response.  Please allow 48 business hours for a response.  Please remember that this is for non-urgent requests.  _______________________________________________________  Thank you for entrusting me with your care and for choosing [facility], Dr. Eulah Pont

## 2023-06-04 MED ORDER — OZEMPIC (0.25 OR 0.5 MG/DOSE) 2 MG/3ML ~~LOC~~ SOPN: 0.2500 mg | PEN_INJECTOR | SUBCUTANEOUS | 1 refills | Status: DC

## 2023-08-08 ENCOUNTER — Ambulatory Visit
Admission: EM | Admit: 2023-08-08 | Discharge: 2023-08-08 | Disposition: A | Discharge status: Home / Self Care | Payer: BC Managed Care – PPO | Attending: Emergency Medicine | Admitting: Emergency Medicine

## 2023-08-08 ENCOUNTER — Encounter: Payer: Self-pay | Admitting: *Deleted

## 2023-08-08 DIAGNOSIS — M5489 Other dorsalgia: Secondary | ICD-10-CM | POA: Diagnosis not present

## 2023-08-08 DIAGNOSIS — I10 Essential (primary) hypertension: Secondary | ICD-10-CM

## 2023-08-08 DIAGNOSIS — R079 Chest pain, unspecified: Secondary | ICD-10-CM | POA: Diagnosis not present

## 2023-08-08 MED ORDER — METHOCARBAMOL 500 MG PO TABS: 500.0000 mg | ORAL_TABLET | Freq: Two times a day (BID) | ORAL | 0 refills | Status: DC | PRN

## 2023-08-08 NOTE — ED Triage Notes (Signed)
 Patient states right upper back pain that started 2 days ago, now radiates to right chest.  Took Goody's back and body and Ibuprofen, pain eases but doesn't go away.

## 2023-08-08 NOTE — Discharge Instructions (Addendum)
 Follow up with your primary care provider tomorrow.  Go to the emergency department if you have worsening symptoms.    Take the methocarbamol as directed.  Do not drive, operate machinery, drink alcohol, or perform dangerous activities while taking this medication as it may cause drowsiness.  Your blood pressure is elevated today at 150/95.  Please have this rechecked by your primary care provider in 2-4 weeks.

## 2023-08-08 NOTE — ED Provider Notes (Signed)
 [name]    CSN: [id number] Arrival date & time: 08/08/23  1203      History   Chief Complaint Chief Complaint  Patient presents with   Back Pain   Chest Pain    HPI [name] is a 38 y.o. female.  Patient presents with back pain and chest pain x 2 days.  She states the back pain is in the right upper area and radiates to her right chest.  No falls or injury.  The back pain feels like a muscle spasm.  It is worse with palpation, movement, deep breath.  Her chest pain also feels like a muscle spasm and is worse with the same.  Her symptoms improve and resolve with rest.  She has been treating her symptoms with ibuprofen and Goody powders.  She denies numbness, weakness, shortness of breath, nausea, vomiting, diaphoresis.  The history is provided by the patient and medical records.    Past Medical History:  Diagnosis Date   Diabetes mellitus without complication (HCC)    before gastric sleeve procedure   GERD (gastroesophageal reflux disease)    Headache    when BP is up   Hypertension    Wears contact lenses     Patient Active Problem List   Diagnosis Date Noted   Tachycardia 06/01/2023   Hematemesis with nausea 06/01/2023   Chest pain 05/29/2023   GERD without esophagitis 05/29/2023   Type 2 diabetes mellitus without complications (HCC) 05/29/2023   Hypokalemia 05/29/2023   Hyponatremia 05/29/2023   Elevated troponin 05/29/2023   Primary hypertension 05/29/2023   Recurrent boils 10/30/2022   Right carpal tunnel syndrome 04/04/2021   Family history of breast cancer 04/03/2019   De Quervain's tenosynovitis, right 03/30/2019   Subacromial bursitis of right shoulder joint 03/30/2019   Multiple joint pain 11/29/2018   Rotator cuff tendinitis, left 11/10/2018   Greater trochanteric bursitis, left 11/10/2018   Left cervical radiculopathy 01/25/2018   Iron deficiency anemia 05/18/2017   Menorrhagia 05/18/2017   Fatigue 03/19/2016   Reflux  esophagitis    Epigastric pain    GERD (gastroesophageal reflux disease) 05/07/2015   Bariatric surgery status 03/19/2015   Class 2 obesity without serious comorbidity with body mass index (BMI) of 37.0 to 37.9 in adult 05/16/2013   Type II diabetes mellitus, well controlled (HCC) 09/22/2012   Hx of primary hypertension 09/22/2012    Past Surgical History:  Procedure Laterality Date   CESAREAN SECTION  04/30/2009   decreased fetal heart tones due to umbilical cord wrapped   CHOLECYSTECTOMY     ESOPHAGOGASTRODUODENOSCOPY (EGD) WITH PROPOFOL N/A 10/24/2015   Procedure: ESOPHAGOGASTRODUODENOSCOPY (EGD) WITH PROPOFOL;  Surgeon: Midge Minium, MD;  Location: [facility];  Service: Endoscopy;  Laterality: N/A;   LAPAROSCOPIC GASTRIC SLEEVE RESECTION N/A 03/19/2015   Procedure: LAPAROSCOPIC GASTRIC SLEEVE RESECTION;  Surgeon: Everette Rank, MD;  Location: [facility];  Service: General;  Laterality: N/A;    OB History     Gravida  2   Para  1   Term  1   Preterm      AB  1   Living  1      SAB  1   IAB      Ectopic      Multiple      Live Births  1            Home Medications    Prior to Admission medications   Medication Sig Start Date  End Date Taking? Authorizing Provider  methocarbamol (ROBAXIN) 500 MG tablet Take 1 tablet (500 mg total) by mouth 2 (two) times daily as needed for muscle spasms. 08/08/23  Yes Mickie Bail, NP  etonogestrel (NEXPLANON) 68 MG IMPL implant 1 each by Subdermal route once. 12/23/21   [provider]  omeprazole (PRILOSEC) 40 MG capsule TAKE 1 CAPSULE(40 MG) BY MOUTH DAILY 06/03/23   Bedsole, Amy E, MD  Semaglutide,0.25 or 0.5MG /DOS, (OZEMPIC, 0.25 OR 0.5 MG/DOSE,) 2 MG/3ML SOPN Inject 0.25 mg into the skin once a week. If nausea/vomiting, please discontinue this medicine and discuss with outpatient prescriber 06/04/23   Excell Seltzer, MD    Family History Family History  Problem Relation Age of Onset   Diabetes  Father    Hypertension Father    Hyperlipidemia Father    Hypertension Sister    Birth defects Sister        fluid on brain cause blindness in B eye   Breast cancer Maternal Aunt 30       has contact   Breast cancer Maternal Aunt 30       has contact   Prostate cancer Paternal Grandfather    Breast cancer Cousin 30       has contact    Social History Social History   Tobacco Use   Smoking status: Never   Smokeless tobacco: Never  Vaping Use   Vaping status: Former  Substance Use Topics   Alcohol use: Yes    Comment: 1 drink/mo   Drug use: No     Allergies   Tomato   Review of Systems Review of Systems  Constitutional:  Negative for chills and fever.  Respiratory:  Negative for cough and shortness of breath.   Cardiovascular:  Positive for chest pain. Negative for palpitations.  Gastrointestinal:  Negative for abdominal pain, nausea and vomiting.  Musculoskeletal:  Positive for back pain. Negative for arthralgias, gait problem and joint swelling.  Skin:  Negative for color change, rash and wound.  Neurological:  Negative for dizziness, syncope, weakness, light-headedness and numbness.     Physical Exam Triage Vital Signs ED Triage Vitals  Encounter Vitals Group     BP      Systolic BP Percentile      Diastolic BP Percentile      Pulse      Resp      Temp      Temp src      SpO2      Weight      Height      Head Circumference      Peak Flow      Pain Score      Pain Loc      Pain Education      Exclude from Growth Chart    No data found.  Updated Vital Signs BP (!) 156/102 (BP Location: Right Arm)   Pulse 77   Temp 98.7 F (37.1 C) (Oral)   Resp 18   Ht 5\' 7"  (1.702 m)   Wt 201 lb (91.2 kg)   SpO2 98%   BMI 31.48 kg/m   Visual Acuity Right Eye Distance:   Left Eye Distance:   Bilateral Distance:    Right Eye Near:   Left Eye Near:    Bilateral Near:     Physical Exam Constitutional:      General: She is not in acute  distress. HENT:     Mouth/Throat:  Mouth: Mucous membranes are moist.  Cardiovascular:     Rate and Rhythm: Normal rate and regular rhythm.     Heart sounds: Normal heart sounds.     Comments: Chest pain is reproducible with palpation. Pulmonary:     Effort: Pulmonary effort is normal. No respiratory distress.     Breath sounds: Normal breath sounds.  Abdominal:     General: Bowel sounds are normal.     Palpations: Abdomen is soft.     Tenderness: There is no abdominal tenderness.  Musculoskeletal:        General: Tenderness present. No swelling or deformity. Normal range of motion.     Comments: Right upper back muscle tenderness to palpation.  Skin:    General: Skin is warm and dry.     Findings: No bruising, erythema, lesion or rash.  Neurological:     General: No focal deficit present.     Mental Status: She is alert and oriented to person, place, and time.     Sensory: No sensory deficit.     Motor: No weakness.     Gait: Gait normal.      UC Treatments / Results  Labs (all labs ordered are listed, but only abnormal results are displayed) Labs Reviewed - No data to display  EKG   Radiology No results found.  Procedures Procedures (including critical care time)  Medications Ordered in UC Medications - No data to display  Initial Impression / Assessment and Plan / UC Course  I have reviewed the triage vital signs and the nursing notes.  Pertinent labs & imaging results that were available during my care of the patient were reviewed by me and considered in my medical decision making (see chart for details).   Chest pain, back pain, elevated blood pressure with hypertension.  Patient declines transfer to the ED.  EKG shows sinus rhythm, rate 69, no ST elevation, compared to previous from 06/01/2023.  Patient's pain appears to be muscle spasms and is reproducible with palpation.  Treating today with methocarbamol.  Precautions for drowsiness with this  medication discussed.  Also discussed with patient that her blood pressure is elevated today and needs to be rechecked by her PCP.  Education provided on managing hypertension.  Instructed patient to follow-up with her PCP tomorrow.  Strict ED precautions given.  Work note provided per patient request.  Education provided on chest pain and back pain.  Patient agrees to plan of care.  Final Clinical Impressions(s) / UC Diagnoses   Final diagnoses:  Chest pain, unspecified type  Other acute back pain  Elevated blood pressure reading in office with diagnosis of hypertension     Discharge Instructions      Follow up with your primary care provider tomorrow.  Go to the emergency department if you have worsening symptoms.    Take the methocarbamol as directed.  Do not drive, operate machinery, drink alcohol, or perform dangerous activities while taking this medication as it may cause drowsiness.  Your blood pressure is elevated today at 150/95.  Please have this rechecked by your primary care provider in 2-4 weeks.          ED Prescriptions     Medication Sig Dispense Auth. Provider   methocarbamol (ROBAXIN) 500 MG tablet Take 1 tablet (500 mg total) by mouth 2 (two) times daily as needed for muscle spasms. 10 tablet Mickie Bail, NP      PDMP not reviewed this encounter.   Wendee Beavers  H, NP 08/08/23 1247

## 2023-08-12 ENCOUNTER — Ambulatory Visit: Payer: BC Managed Care – PPO | Admitting: Physician Assistant

## 2023-08-30 ENCOUNTER — Telehealth: Payer: Self-pay | Admitting: *Deleted

## 2023-08-30 ENCOUNTER — Other Ambulatory Visit: Payer: Self-pay | Admitting: Family Medicine

## 2023-08-30 DIAGNOSIS — E119 Type 2 diabetes mellitus without complications: Secondary | ICD-10-CM

## 2023-08-30 NOTE — Telephone Encounter (Signed)
Please schedule CPE with fasting labs prior with Dr. Bedsole.  

## 2023-08-30 NOTE — Telephone Encounter (Signed)
 Spoke to pt, scheduled cpe for 09/15/23

## 2023-08-30 NOTE — Telephone Encounter (Signed)
-----   Message from Alvina Chou sent at 08/30/2023  3:41 PM EDT ----- Regarding: Lab orders for TUE, 3.18.25 Patient is scheduled for CPX labs, please order future labs, Thanks , Camelia Eng

## 2023-09-08 ENCOUNTER — Other Ambulatory Visit

## 2023-09-08 ENCOUNTER — Encounter: Payer: Self-pay | Admitting: Family Medicine

## 2023-09-08 DIAGNOSIS — E119 Type 2 diabetes mellitus without complications: Secondary | ICD-10-CM | POA: Diagnosis not present

## 2023-09-08 LAB — LIPID PANEL
Cholesterol: 158 mg/dL (ref 0–200)
HDL: 43.8 mg/dL (ref >39.00)
LDL Cholesterol: 101 mg/dL — ABNORMAL HIGH (ref 0–99)
NonHDL: 113.87
Total CHOL/HDL Ratio: 4
Triglycerides: 62 mg/dL (ref 0.0–149.0)
VLDL: 12.4 mg/dL (ref 0.0–40.0)

## 2023-09-08 LAB — COMPREHENSIVE METABOLIC PANEL
ALT: 10 U/L (ref 0–35)
AST: 10 U/L (ref 0–37)
Albumin: 4.3 g/dL (ref 3.5–5.2)
Alkaline Phosphatase: 48 U/L (ref 39–117)
BUN: 9 mg/dL (ref 6–23)
CO2: 29 meq/L (ref 19–32)
Calcium: 9.1 mg/dL (ref 8.4–10.5)
Chloride: 105 meq/L (ref 96–112)
Creatinine, Ser: 0.65 mg/dL (ref 0.40–1.20)
GFR: 114.08 mL/min (ref >60.00)
Glucose, Bld: 79 mg/dL (ref 70–99)
Potassium: 3.9 meq/L (ref 3.5–5.1)
Sodium: 140 meq/L (ref 135–145)
Total Bilirubin: 0.4 mg/dL (ref 0.2–1.2)
Total Protein: 7.2 g/dL (ref 6.0–8.3)

## 2023-09-08 LAB — MICROALBUMIN / CREATININE URINE RATIO
Creatinine,U: 234 mg/dL
Microalb Creat Ratio: 9.4 mg/g (ref 0.0–30.0)
Microalb, Ur: 2.2 mg/dL — ABNORMAL HIGH (ref 0.0–1.9)

## 2023-09-08 LAB — HEMOGLOBIN A1C: Hgb A1c MFr Bld: 6 % (ref 4.6–6.5)

## 2023-09-08 NOTE — Progress Notes (Signed)
 No critical labs need to be addressed urgently. We will discuss labs in detail at upcoming office visit.

## 2023-09-15 ENCOUNTER — Ambulatory Visit (INDEPENDENT_AMBULATORY_CARE_PROVIDER_SITE_OTHER): Admitting: Family Medicine

## 2023-09-15 ENCOUNTER — Encounter: Payer: Self-pay | Admitting: Family Medicine

## 2023-09-15 VITALS — BP 126/82 | HR 75 | Temp 98.2°F | Ht 66.5 in | Wt 208.0 lb

## 2023-09-15 DIAGNOSIS — E1169 Type 2 diabetes mellitus with other specified complication: Secondary | ICD-10-CM | POA: Insufficient documentation

## 2023-09-15 DIAGNOSIS — Z0000 Encounter for general adult medical examination without abnormal findings: Secondary | ICD-10-CM

## 2023-09-15 DIAGNOSIS — Z6837 Body mass index (BMI) 37.0-37.9, adult: Secondary | ICD-10-CM

## 2023-09-15 DIAGNOSIS — K219 Gastro-esophageal reflux disease without esophagitis: Secondary | ICD-10-CM

## 2023-09-15 DIAGNOSIS — Z7985 Long-term (current) use of injectable non-insulin antidiabetic drugs: Secondary | ICD-10-CM

## 2023-09-15 DIAGNOSIS — E785 Hyperlipidemia, unspecified: Secondary | ICD-10-CM

## 2023-09-15 DIAGNOSIS — E66812 Obesity, class 2: Secondary | ICD-10-CM

## 2023-09-15 DIAGNOSIS — Z111 Encounter for screening for respiratory tuberculosis: Secondary | ICD-10-CM

## 2023-09-15 DIAGNOSIS — E6609 Other obesity due to excess calories: Secondary | ICD-10-CM

## 2023-09-15 DIAGNOSIS — E119 Type 2 diabetes mellitus without complications: Secondary | ICD-10-CM

## 2023-09-15 DIAGNOSIS — Z23 Encounter for immunization: Secondary | ICD-10-CM | POA: Diagnosis not present

## 2023-09-15 LAB — HM DIABETES FOOT EXAM

## 2023-09-15 MED ORDER — OZEMPIC (0.25 OR 0.5 MG/DOSE) 2 MG/3ML ~~LOC~~ SOPN: 0.5000 mg | PEN_INJECTOR | SUBCUTANEOUS | 11 refills | Status: DC

## 2023-09-15 MED ORDER — ROSUVASTATIN CALCIUM 5 MG PO TABS: 5.0000 mg | ORAL_TABLET | Freq: Every day | ORAL | 3 refills | Status: AC

## 2023-09-15 NOTE — Assessment & Plan Note (Signed)
 Chronic, now resolved on omeprazole 40 mg daily.

## 2023-09-15 NOTE — Assessment & Plan Note (Signed)
 Chronic, well-controlled on semaglutide 0.25 mg weekly. She had temporarily held this after admission for epigastric pain but GI has okayed her restarting it.  They felt epigastric pain most likely secondary to gastritis and reflux.  She currently has no issues.  Increase semaglutide to 0.5 mg weekly for added weight loss benefit. She will continue working on healthy lifestyle and regular exercise.

## 2023-09-15 NOTE — Addendum Note (Signed)
 Addended by: Lovena Neighbours on: 09/15/2023 11:26 AM   Modules accepted: Orders

## 2023-09-15 NOTE — Assessment & Plan Note (Signed)
 Chronic, will increase semaglutide to dose that would contribute to more weight loss.

## 2023-09-15 NOTE — Progress Notes (Signed)
 Patient ID: [name], female    DOB: May 18, 1986, 38 y.o.   MRN: [id number]  This visit was conducted in person.  BP 126/82   Pulse 75   Temp 98.2 F (36.8 C) (Temporal)   Ht 5' 6.5" (1.689 m)   Wt 208 lb (94.3 kg)   LMP 08/28/2023   SpO2 99%   BMI 33.07 kg/m    CC:  Chief Complaint  Patient presents with   Annual Exam    Subjective:   HPI: [name] is a 37 y.o. female presenting on 09/15/2023 for Annual Exam  Diabetes:   On semaglutide 0.25 mg weekly  ( had some epigastric pain see last PV note... but no further issues GI okay'd restart) Lab Results  Component Value Date   HGBA1C 6.0 09/08/2023  Using medications without difficulties: Hypoglycemic episodes: Hyperglycemic episodes: Feet problems: none Blood Sugars averaging: none eye exam within last year:  Wt Readings from Last 3 Encounters:  09/15/23 208 lb (94.3 kg)  08/08/23 201 lb (91.2 kg)  06/03/23 205 lb (93 kg)  Body mass index is 33.07 kg/m.  Diet:  She has been working on healthy low carb diet, meal prep ing. Has been working aggressively ion since July.  Working with a Research scientist (life sciences).  She has been working on portion control. Exercise: regular exercise.. treadmill etc. 3 times a week 45 min.      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  Relevant past medical, surgical, family and social history reviewed and updated as indicated. Interim medical history since our last visit reviewed. Allergies and medications reviewed and updated. Outpatient Medications Prior to Visit  Medication Sig Dispense Refill   etonogestrel (NEXPLANON) 68 MG IMPL implant 1 each by Subdermal route once.     methocarbamol (ROBAXIN) 500 MG tablet Take 1 tablet (500 mg total) by mouth 2 (two) times daily as needed for muscle spasms. 10 tablet 0   omeprazole (PRILOSEC) 40 MG capsule TAKE 1 CAPSULE(40 MG) BY MOUTH DAILY 90 capsule 0   Semaglutide,0.25 or 0.5MG /DOS, (OZEMPIC, 0.25 OR 0.5 MG/DOSE,) 2 MG/3ML  SOPN Inject 0.25 mg into the skin once a week. If nausea/vomiting, please discontinue this medicine and discuss with outpatient prescriber 3 mL 1   No facility-administered medications prior to visit.     Per HPI unless specifically indicated in ROS section below Review of Systems  Constitutional:  Negative for fatigue and fever.  HENT:  Negative for congestion.   Eyes:  Negative for pain.  Respiratory:  Negative for cough and shortness of breath.   Cardiovascular:  Negative for chest pain, palpitations and leg swelling.  Gastrointestinal:  Negative for abdominal pain.  Genitourinary:  Negative for dysuria and vaginal bleeding.  Musculoskeletal:  Negative for back pain.  Neurological:  Negative for syncope, light-headedness and headaches.  Psychiatric/Behavioral:  Negative for dysphoric mood.    Objective:  BP 126/82   Pulse 75   Temp 98.2 F (36.8 C) (Temporal)   Ht 5' 6.5" (1.689 m)   Wt 208 lb (94.3 kg)   LMP 08/28/2023   SpO2 99%   BMI 33.07 kg/m   Wt Readings from Last 3 Encounters:  09/15/23 208 lb (94.3 kg)  08/08/23 201 lb (91.2 kg)  06/03/23 205 lb (93 kg)      Physical Exam Vitals and nursing note reviewed.  Constitutional:      General: She is not in acute distress.    Appearance: Normal appearance. She is  well-developed. She is obese. She is not ill-appearing or toxic-appearing.  HENT:     Head: Normocephalic.     Right Ear: Hearing, tympanic membrane, ear canal and external ear normal.     Left Ear: Hearing, tympanic membrane, ear canal and external ear normal.     Nose: Nose normal.  Eyes:     General: Lids are normal. Lids are everted, no foreign bodies appreciated.     Conjunctiva/sclera: Conjunctivae normal.     Pupils: Pupils are equal, round, and reactive to light.  Neck:     Thyroid: No thyroid mass or thyromegaly.     Vascular: No carotid bruit.     Trachea: Trachea normal.  Cardiovascular:     Rate and Rhythm: Normal rate and regular rhythm.      Heart sounds: Normal heart sounds, S1 normal and S2 normal. No murmur heard.    No gallop.  Pulmonary:     Effort: Pulmonary effort is normal. No respiratory distress.     Breath sounds: Normal breath sounds. No wheezing, rhonchi or rales.  Abdominal:     General: Bowel sounds are normal. There is no distension or abdominal bruit.     Palpations: Abdomen is soft. There is no fluid wave or mass.     Tenderness: There is no abdominal tenderness. There is no guarding or rebound.     Hernia: No hernia is present.  Musculoskeletal:     Cervical back: Normal range of motion and neck supple.  Lymphadenopathy:     Cervical: No cervical adenopathy.  Skin:    General: Skin is warm and dry.     Findings: No rash.  Neurological:     Mental Status: She is alert.     Cranial Nerves: No cranial nerve deficit.     Sensory: No sensory deficit.  Psychiatric:        Mood and Affect: Mood is not anxious or depressed.        Speech: Speech normal.        Behavior: Behavior normal. Behavior is cooperative.        Judgment: Judgment normal.       Diabetic foot exam: Normal inspection No skin breakdown No calluses  Normal DP pulses Normal sensation to light touch and monofilament Nails normal  Results for orders placed or performed in visit on 09/15/23  HM DIABETES FOOT EXAM   Collection Time: 09/15/23 12:00 AM  Result Value Ref Range   HM Diabetic Foot Exam done      COVID 19 screen:  No recent travel or known exposure to COVID19 The patient denies respiratory symptoms of COVID 19 at this time. The importance of social distancing was discussed today.   Assessment and Plan The patient's preventative maintenance and recommended screening tests for an annual wellness exam were reviewed in full today. Brought up to date unless services declined.  Counselled on the importance of diet, exercise, and its role in overall health and mortality. The patient's FH and SH was reviewed,  including their home life, tobacco status, and drug and alcohol status.   Vaccines:Consider PNA 23 given DM, ( not interested given now prediabetes range) COVID x 3, flu due ( refused),  Given Tdap today Pap/DVE:  05/2023. followed by Marilu Favre. Mammo:  not indicated, no early family history ( MGM with breast cancer).. did have 03/2020 Colon:  not indicated, no early  first degree family history. Smoking Status:none ETOH/ drug use: rare/ none  Hep C:  neg  HIV screen:  done    Problem List Items Addressed This Visit     Class 2 obesity without serious comorbidity with body mass index (BMI) of 37.0 to 37.9 in adult   Chronic, will increase semaglutide to dose that would contribute to more weight loss.      Relevant Medications   Semaglutide,0.25 or 0.5MG /DOS, (OZEMPIC, 0.25 OR 0.5 MG/DOSE,) 2 MG/3ML SOPN   GERD (gastroesophageal reflux disease)   Chronic, now resolved on omeprazole 40 mg daily.      Hyperlipidemia associated with type 2 diabetes mellitus (HCC)   Chronic, recent worsening control of LDL, no longer at goal less than 100.  Patient not on statin medication in spite of diabetes. Will start low-dose Crestor 5 mg daily.  Reevaluate with lipids and complete metabolic panel in 3 months.      Relevant Medications   rosuvastatin (CRESTOR) 5 MG tablet   Semaglutide,0.25 or 0.5MG /DOS, (OZEMPIC, 0.25 OR 0.5 MG/DOSE,) 2 MG/3ML SOPN   Type II diabetes mellitus, well controlled (HCC)   Chronic, well-controlled on semaglutide 0.25 mg weekly. She had temporarily held this after admission for epigastric pain but GI has okayed her restarting it.  They felt epigastric pain most likely secondary to gastritis and reflux.  She currently has no issues.  Increase semaglutide to 0.5 mg weekly for added weight loss benefit. She will continue working on healthy lifestyle and regular exercise.      Relevant Medications   rosuvastatin (CRESTOR) 5 MG tablet   Semaglutide,0.25 or  0.5MG /DOS, (OZEMPIC, 0.25 OR 0.5 MG/DOSE,) 2 MG/3ML SOPN   Other Visit Diagnoses       Routine general medical examination at a health care facility    -  Primary     Screening-pulmonary TB       Relevant Orders   QuantiFERON-TB Gold Plus        Kerby Nora, MD

## 2023-09-15 NOTE — Patient Instructions (Addendum)
 Increase  Ozempic to 0.5 mg week Start crestor 5 mg daily for cholesterol control.  Continue great work on healthy eating and regular exercise!

## 2023-09-15 NOTE — Assessment & Plan Note (Signed)
 Chronic, recent worsening control of LDL, no longer at goal less than 100.  Patient not on statin medication in spite of diabetes. Will start low-dose Crestor 5 mg daily.  Reevaluate with lipids and complete metabolic panel in 3 months.

## 2023-09-16 ENCOUNTER — Other Ambulatory Visit

## 2023-10-19 ENCOUNTER — Other Ambulatory Visit: Payer: Self-pay

## 2023-10-19 ENCOUNTER — Emergency Department: Payer: Self-pay

## 2023-10-19 DIAGNOSIS — R42 Dizziness and giddiness: Secondary | ICD-10-CM | POA: Insufficient documentation

## 2023-10-19 DIAGNOSIS — R1110 Vomiting, unspecified: Secondary | ICD-10-CM | POA: Insufficient documentation

## 2023-10-19 DIAGNOSIS — Z5321 Procedure and treatment not carried out due to patient leaving prior to being seen by health care provider: Secondary | ICD-10-CM | POA: Insufficient documentation

## 2023-10-19 DIAGNOSIS — R1013 Epigastric pain: Secondary | ICD-10-CM | POA: Insufficient documentation

## 2023-10-19 LAB — COMPREHENSIVE METABOLIC PANEL WITH GFR
ALT: 14 U/L (ref 0–44)
AST: 15 U/L (ref 15–41)
Albumin: 4.4 g/dL (ref 3.5–5.0)
Alkaline Phosphatase: 47 U/L (ref 38–126)
Anion gap: 14 (ref 5–15)
BUN: 7 mg/dL (ref 6–20)
CO2: 20 mmol/L — ABNORMAL LOW (ref 22–32)
Calcium: 9.3 mg/dL (ref 8.9–10.3)
Chloride: 103 mmol/L (ref 98–111)
Creatinine, Ser: 0.61 mg/dL (ref 0.44–1.00)
GFR, Estimated: >60 mL/min (ref >60)
Glucose, Bld: 153 mg/dL — ABNORMAL HIGH (ref 70–99)
Potassium: 3.2 mmol/L — ABNORMAL LOW (ref 3.5–5.1)
Sodium: 137 mmol/L (ref 135–145)
Total Bilirubin: 0.7 mg/dL (ref 0.0–1.2)
Total Protein: 8.3 g/dL — ABNORMAL HIGH (ref 6.5–8.1)

## 2023-10-19 LAB — TROPONIN I (HIGH SENSITIVITY)
Troponin I (High Sensitivity): <2 ng/L (ref <18)
Troponin I (High Sensitivity): <2 ng/L (ref <18)

## 2023-10-19 LAB — CBC WITH DIFFERENTIAL/PLATELET
Abs Immature Granulocytes: 0.03 10*3/uL (ref 0.00–0.07)
Basophils Absolute: 0 10*3/uL (ref 0.0–0.1)
Basophils Relative: 0 %
Eosinophils Absolute: 0 10*3/uL (ref 0.0–0.5)
Eosinophils Relative: 0 %
HCT: 39.2 % (ref 36.0–46.0)
Hemoglobin: 13 g/dL (ref 12.0–15.0)
Immature Granulocytes: 0 %
Lymphocytes Relative: 15 %
Lymphs Abs: 1.4 10*3/uL (ref 0.7–4.0)
MCH: 25 pg — ABNORMAL LOW (ref 26.0–34.0)
MCHC: 33.2 g/dL (ref 30.0–36.0)
MCV: 75.4 fL — ABNORMAL LOW (ref 80.0–100.0)
Monocytes Absolute: 0.5 10*3/uL (ref 0.1–1.0)
Monocytes Relative: 5 %
Neutro Abs: 7.6 10*3/uL (ref 1.7–7.7)
Neutrophils Relative %: 80 %
Platelets: 321 10*3/uL (ref 150–400)
RBC: 5.2 MIL/uL — ABNORMAL HIGH (ref 3.87–5.11)
RDW: 14.6 % (ref 11.5–15.5)
WBC: 9.6 10*3/uL (ref 4.0–10.5)
nRBC: 0 % (ref 0.0–0.2)

## 2023-10-19 NOTE — ED Triage Notes (Signed)
 POV with CC of epigastric pain that started today. Hx of same for which pt has been following up with GI. Vomiting without diarrhea. Dizziness. A&Ox4.

## 2023-10-20 ENCOUNTER — Encounter: Payer: Self-pay | Admitting: Emergency Medicine

## 2023-10-20 ENCOUNTER — Emergency Department: Payer: Self-pay

## 2023-10-20 ENCOUNTER — Emergency Department
Admission: EM | Admit: 2023-10-20 | Discharge: 2023-10-20 | Disposition: A | Discharge status: Home / Self Care | Payer: Self-pay | Attending: Emergency Medicine | Admitting: Emergency Medicine

## 2023-10-20 ENCOUNTER — Other Ambulatory Visit: Payer: Self-pay

## 2023-10-20 ENCOUNTER — Emergency Department
Admission: EM | Admit: 2023-10-20 | Discharge: 2023-10-20 | Discharge status: Against Medical Advice | Payer: Self-pay | Attending: Emergency Medicine | Admitting: Emergency Medicine

## 2023-10-20 DIAGNOSIS — I10 Essential (primary) hypertension: Secondary | ICD-10-CM | POA: Diagnosis not present

## 2023-10-20 DIAGNOSIS — R1010 Upper abdominal pain, unspecified: Secondary | ICD-10-CM | POA: Diagnosis present

## 2023-10-20 DIAGNOSIS — R112 Nausea with vomiting, unspecified: Secondary | ICD-10-CM | POA: Insufficient documentation

## 2023-10-20 DIAGNOSIS — D72829 Elevated white blood cell count, unspecified: Secondary | ICD-10-CM | POA: Insufficient documentation

## 2023-10-20 DIAGNOSIS — E871 Hypo-osmolality and hyponatremia: Secondary | ICD-10-CM | POA: Insufficient documentation

## 2023-10-20 LAB — CBC WITH DIFFERENTIAL/PLATELET
Abs Immature Granulocytes: 0.04 10*3/uL (ref 0.00–0.07)
Basophils Absolute: 0 10*3/uL (ref 0.0–0.1)
Basophils Relative: 0 %
Eosinophils Absolute: 0 10*3/uL (ref 0.0–0.5)
Eosinophils Relative: 0 %
HCT: 41.3 % (ref 36.0–46.0)
Hemoglobin: 13.8 g/dL (ref 12.0–15.0)
Immature Granulocytes: 0 %
Lymphocytes Relative: 7 %
Lymphs Abs: 0.9 10*3/uL (ref 0.7–4.0)
MCH: 25.1 pg — ABNORMAL LOW (ref 26.0–34.0)
MCHC: 33.4 g/dL (ref 30.0–36.0)
MCV: 75.2 fL — ABNORMAL LOW (ref 80.0–100.0)
Monocytes Absolute: 0.3 10*3/uL (ref 0.1–1.0)
Monocytes Relative: 3 %
Neutro Abs: 11.2 10*3/uL — ABNORMAL HIGH (ref 1.7–7.7)
Neutrophils Relative %: 90 %
Platelets: 351 10*3/uL (ref 150–400)
RBC: 5.49 MIL/uL — ABNORMAL HIGH (ref 3.87–5.11)
RDW: 14.6 % (ref 11.5–15.5)
WBC: 12.5 10*3/uL — ABNORMAL HIGH (ref 4.0–10.5)
nRBC: 0 % (ref 0.0–0.2)

## 2023-10-20 LAB — COMPREHENSIVE METABOLIC PANEL WITH GFR
ALT: 14 U/L (ref 0–44)
AST: 21 U/L (ref 15–41)
Albumin: 4.7 g/dL (ref 3.5–5.0)
Alkaline Phosphatase: 55 U/L (ref 38–126)
Anion gap: 11 (ref 5–15)
BUN: 6 mg/dL (ref 6–20)
CO2: 21 mmol/L — ABNORMAL LOW (ref 22–32)
Calcium: 9.6 mg/dL (ref 8.9–10.3)
Chloride: 102 mmol/L (ref 98–111)
Creatinine, Ser: 0.65 mg/dL (ref 0.44–1.00)
GFR, Estimated: >60 mL/min (ref >60)
Glucose, Bld: 149 mg/dL — ABNORMAL HIGH (ref 70–99)
Potassium: 4.2 mmol/L (ref 3.5–5.1)
Sodium: 134 mmol/L — ABNORMAL LOW (ref 135–145)
Total Bilirubin: 0.6 mg/dL (ref 0.0–1.2)
Total Protein: 9.6 g/dL — ABNORMAL HIGH (ref 6.5–8.1)

## 2023-10-20 LAB — URINALYSIS, W/ REFLEX TO CULTURE (INFECTION SUSPECTED)
Bilirubin Urine: NEGATIVE
Glucose, UA: 50 mg/dL — AB
Ketones, ur: 80 mg/dL — AB
Leukocytes,Ua: NEGATIVE
Nitrite: NEGATIVE
Protein, ur: >=300 mg/dL — AB
Specific Gravity, Urine: 1.016 (ref 1.005–1.030)
pH: 5 (ref 5.0–8.0)

## 2023-10-20 LAB — LIPASE, BLOOD: Lipase: 24 U/L (ref 11–51)

## 2023-10-20 LAB — POC URINE PREG, ED: Preg Test, Ur: NEGATIVE

## 2023-10-20 LAB — HCG, QUANTITATIVE, PREGNANCY: hCG, Beta Chain, Quant, S: <1 m[IU]/mL (ref <5)

## 2023-10-20 MED ORDER — MORPHINE SULFATE (PF) 4 MG/ML IV SOLN
4.0000 mg | Freq: Once | INTRAVENOUS | Status: AC
  Administered 2023-10-20: 4 mg via INTRAVENOUS
  Filled 2023-10-20: qty 1

## 2023-10-20 MED ORDER — SODIUM CHLORIDE 0.9 % IV BOLUS
1000.0000 mL | Freq: Once | INTRAVENOUS | Status: AC
  Administered 2023-10-20: 1000 mL via INTRAVENOUS

## 2023-10-20 MED ORDER — SUCRALFATE 1 GM/10ML PO SUSP
1.0000 g | Freq: Four times a day (QID) | ORAL | 1 refills | Status: DC
Start: 2023-10-20 — End: 2024-04-06

## 2023-10-20 MED ORDER — ONDANSETRON HCL 4 MG/2ML IJ SOLN
4.0000 mg | Freq: Once | INTRAMUSCULAR | Status: AC
  Administered 2023-10-20: 4 mg via INTRAVENOUS
  Filled 2023-10-20: qty 2

## 2023-10-20 MED ORDER — ALUM & MAG HYDROXIDE-SIMETH 200-200-20 MG/5ML PO SUSP
30.0000 mL | Freq: Once | ORAL | Status: AC
  Administered 2023-10-20: 30 mL via ORAL
  Filled 2023-10-20: qty 30

## 2023-10-20 MED ORDER — SUCRALFATE 1 GM/10ML PO SUSP
1.0000 g | Freq: Once | ORAL | Status: AC
  Administered 2023-10-20: 1 g via ORAL
  Filled 2023-10-20: qty 10

## 2023-10-20 MED ORDER — DROPERIDOL 2.5 MG/ML IJ SOLN
2.5000 mg | Freq: Once | INTRAMUSCULAR | Status: AC | PRN
  Administered 2023-10-20: 2.5 mg via INTRAVENOUS
  Filled 2023-10-20: qty 2

## 2023-10-20 MED ORDER — PANTOPRAZOLE SODIUM 40 MG IV SOLR
40.0000 mg | Freq: Once | INTRAVENOUS | Status: AC
  Administered 2023-10-20: 40 mg via INTRAVENOUS
  Filled 2023-10-20: qty 10

## 2023-10-20 MED ORDER — IOHEXOL 300 MG/ML  SOLN
100.0000 mL | Freq: Once | INTRAMUSCULAR | Status: AC | PRN
  Administered 2023-10-20: 100 mL via INTRAVENOUS

## 2023-10-20 NOTE — Discharge Instructions (Addendum)
 Continue taking the omeprazole  daily.  Use Carafate  as prescribed as well.  Take Tylenol  650 mg every 6 hours as needed for pain.  Dr. Orlando Bison was your bariatric surgeon who did your gastric sleeve, follow-up with him for an appointment.  I also made a referral to a gastroenterologist who should call you to schedule an appointment in [place].  Call your regular doctor for an appointment and recheck your blood pressure.  It was high today but may have been high due to your discomfort.  If your blood pressure remains high, talk to them about starting or adjusting high blood pressure medications.  Also talk to your regular doctor about your Ozempic  as this medication may cause upset stomach.  Thank you for choosing us  for your health care today!  Please see your primary doctor this week for a follow up appointment.   If you have any new, worsening, or unexpected symptoms call your doctor right away or come back to the emergency department for reevaluation.  It was my pleasure to care for you today.   Arron Large Margery Sheets, MD

## 2023-10-20 NOTE — ED Notes (Signed)
 No answer when called several times from lobby

## 2023-10-20 NOTE — ED Notes (Signed)
 Pt states pain has not improved and is now 6/10. MD aware.

## 2023-10-20 NOTE — ED Notes (Signed)
 Modesto Charon MD at bedside

## 2023-10-20 NOTE — ED Provider Notes (Signed)
 [facility] Provider Note    Event Date/Time   First MD Initiated Contact with Patient 10/20/23 [phone]     (approximate)   History   Abdominal Pain   HPI  [name] is a 38 y.o. female   Past medical history of diabetes, GERD, hypertension, history of gastric sleeve and cholecystectomy here with upper abdominal pain.  Radiates across the epigastrium and bilateral upper quadrants.  Started earlier this day.  Associate with nausea and vomiting.  Since the pain started she has not had a bowel movement or flatus.  No GI bleeding.  No urinary symptoms.  Denies chest pain or shortness of breath.  External Medical Documents Reviewed: Discharge summary from December 2024 when she presented for similar symptoms but resolved with medical management, question gastroparesis versus viral gastroenteritis versus in the setting of Ozempic       Physical Exam   Triage Vital Signs: ED Triage Vitals  Encounter Vitals Group     BP 10/20/23 0233 (!) 202/112     Systolic BP Percentile --      Diastolic BP Percentile --      Pulse Rate 10/20/23 0233 73     Resp 10/20/23 0233 20     Temp 10/20/23 0233 97.6 F (36.4 C)     Temp Source 10/20/23 0233 Oral     SpO2 10/20/23 0233 100 %     Weight 10/20/23 0232 199 lb 15.3 oz (90.7 kg)     Height 10/20/23 0232 5\' 6"  (1.676 m)     Head Circumference --      Peak Flow --      Pain Score 10/20/23 0232 10     Pain Loc --      Pain Education --      Exclude from Growth Chart --     Most recent vital signs: Vitals:   10/20/23 0530 10/20/23 0600  BP: (!) 185/105 (!) 197/104  Pulse:  96  Resp:  18  Temp:    SpO2:  99%    General: Awake, no distress.  CV:  Good peripheral perfusion.  Resp:  Normal effort.  Abd:  No distention. Other:  She looks uncomfortable, hypertensive and has tenderness to palpation across the upper portion of her abdomen without rigidity or guarding.   ED Results / Procedures / Treatments    Labs (all labs ordered are listed, but only abnormal results are displayed) Labs Reviewed  COMPREHENSIVE METABOLIC PANEL WITH GFR - Abnormal; Notable for the following components:      Result Value   Sodium 134 (*)    CO2 21 (*)    Glucose, Bld 149 (*)    Total Protein 9.6 (*)    All other components within normal limits  CBC WITH DIFFERENTIAL/PLATELET - Abnormal; Notable for the following components:   WBC 12.5 (*)    RBC 5.49 (*)    MCV 75.2 (*)    MCH 25.1 (*)    Neutro Abs 11.2 (*)    All other components within normal limits  URINALYSIS, W/ REFLEX TO CULTURE (INFECTION SUSPECTED) - Abnormal; Notable for the following components:   Color, Urine YELLOW (*)    APPearance CLEAR (*)    Glucose, UA 50 (*)    Hgb urine dipstick MODERATE (*)    Ketones, ur 80 (*)    Protein, ur >=300 (*)    Bacteria, UA RARE (*)    All other components within normal limits  LIPASE, BLOOD  HCG, QUANTITATIVE, PREGNANCY  POC URINE PREG, ED     I ordered and reviewed the above labs they are notable for white blood cell, LE elevated 12.5.  EKG  ED ECG REPORT I, Buell Carmin, the attending physician, personally viewed and interpreted this ECG.   Date: 10/20/2023  EKG Time: 2000  Rate: 74  Rhythm: sinus  Axis: nl  Intervals:none  ST&T Change: no stemi    RADIOLOGY I independently reviewed and interpreted CT of the abdomen pelvis and see no obvious obstructive or inflammatory changes I also reviewed radiologist's formal read.   PROCEDURES:  Critical Care performed: No  Procedures   MEDICATIONS ORDERED IN ED: Medications  sucralfate  (CARAFATE ) 1 GM/10ML suspension 1 g (has no administration in time range)  sodium chloride  0.9 % bolus 1,000 mL (0 mLs Intravenous Stopped 10/20/23 0504)  ondansetron  (ZOFRAN ) injection 4 mg (4 mg Intravenous Given 10/20/23 0300)  pantoprazole  (PROTONIX ) injection 40 mg (40 mg Intravenous Given 10/20/23 0309)  alum & mag hydroxide-simeth (MAALOX/MYLANTA)  200-200-20 MG/5ML suspension 30 mL (30 mLs Oral Given 10/20/23 0309)  morphine  (PF) 4 MG/ML injection 4 mg (4 mg Intravenous Given 10/20/23 0308)  iohexol  (OMNIPAQUE ) 300 MG/ML solution 100 mL (100 mLs Intravenous Contrast Given 10/20/23 0340)  droperidol (INAPSINE) 2.5 MG/ML injection 2.5 mg (2.5 mg Intravenous Given 10/20/23 0503)     IMPRESSION / MDM / ASSESSMENT AND PLAN / ED COURSE  I reviewed the triage vital signs and the nursing notes.                                Patient's presentation is most consistent with acute presentation with potential threat to life or bodily function.  Differential diagnosis includes, but is not limited to, intra-abdominal infection, gastritis/GERD/ulcer, choledocholithiasis, pancreatitis, obstruction   The patient is on the cardiac monitor to evaluate for evidence of arrhythmia and/or significant heart rate changes.  MDM:    Quite uncomfortable patient with surgical abdominal history is here with acute onset abdominal pain nausea vomiting without passing flatus concern for obstruction or other intra-abdominal infectious pathologies, perforations, best assessed by CT scan.  Will medicate with IV antinausea medications, fluids, morphine .  Get labs including LFTs lipase.  -- Labs urinalysis and CT unremarkable.  No evidence of intra-abdominal infection or obstruction, no biliary dilation or bilirubinemia to suggest choledocholithiasis.  Patient moderately better with pain medication and antiemetic.  Will give referral to GI.  She will follow-up with her bariatric surgeon and PMD.     FINAL CLINICAL IMPRESSION(S) / ED DIAGNOSES   Final diagnoses:  Pain of upper abdomen  Nausea and vomiting, unspecified vomiting type  Uncontrolled hypertension     Rx / DC Orders   ED Discharge Orders          Ordered    Ambulatory referral to Gastroenterology        10/20/23 0622    sucralfate  (CARAFATE ) 1 GM/10ML suspension  4 times daily        10/20/23 0622              Note:  This document was prepared using Dragon voice recognition software and may include unintentional dictation errors.    Buell Carmin, MD 10/20/23 [phone]

## 2023-10-20 NOTE — ED Triage Notes (Signed)
 Patient ambulatory to triage with steady gait, without difficulty or distress noted; pt here last night, had protocols but left before being seen due to long wait; c/o persistent epigastric pain and nausea

## 2023-10-20 NOTE — ED Notes (Signed)
 Pt provided with pillow and head of bed raised. No other comfort measures requested at this time.

## 2023-10-24 ENCOUNTER — Other Ambulatory Visit: Payer: Self-pay

## 2023-10-24 ENCOUNTER — Emergency Department
Admission: EM | Admit: 2023-10-24 | Discharge: 2023-10-24 | Disposition: A | Discharge status: Home / Self Care | Attending: Emergency Medicine | Admitting: Emergency Medicine

## 2023-10-24 DIAGNOSIS — R112 Nausea with vomiting, unspecified: Secondary | ICD-10-CM | POA: Diagnosis not present

## 2023-10-24 DIAGNOSIS — E119 Type 2 diabetes mellitus without complications: Secondary | ICD-10-CM | POA: Insufficient documentation

## 2023-10-24 DIAGNOSIS — E871 Hypo-osmolality and hyponatremia: Secondary | ICD-10-CM | POA: Diagnosis not present

## 2023-10-24 DIAGNOSIS — I10 Essential (primary) hypertension: Secondary | ICD-10-CM | POA: Insufficient documentation

## 2023-10-24 DIAGNOSIS — E876 Hypokalemia: Secondary | ICD-10-CM | POA: Diagnosis not present

## 2023-10-24 DIAGNOSIS — R1013 Epigastric pain: Secondary | ICD-10-CM | POA: Insufficient documentation

## 2023-10-24 LAB — COMPREHENSIVE METABOLIC PANEL WITH GFR
ALT: 14 U/L (ref 0–44)
AST: 22 U/L (ref 15–41)
Albumin: 4.5 g/dL (ref 3.5–5.0)
Alkaline Phosphatase: 51 U/L (ref 38–126)
Anion gap: 20 — ABNORMAL HIGH (ref 5–15)
BUN: 11 mg/dL (ref 6–20)
CO2: 19 mmol/L — ABNORMAL LOW (ref 22–32)
Calcium: 9.6 mg/dL (ref 8.9–10.3)
Chloride: 94 mmol/L — ABNORMAL LOW (ref 98–111)
Creatinine, Ser: 0.89 mg/dL (ref 0.44–1.00)
GFR, Estimated: >60 mL/min (ref >60)
Glucose, Bld: 187 mg/dL — ABNORMAL HIGH (ref 70–99)
Potassium: 2.8 mmol/L — ABNORMAL LOW (ref 3.5–5.1)
Sodium: 133 mmol/L — ABNORMAL LOW (ref 135–145)
Total Bilirubin: 1.1 mg/dL (ref 0.0–1.2)
Total Protein: 8.5 g/dL — ABNORMAL HIGH (ref 6.5–8.1)

## 2023-10-24 LAB — URINALYSIS, ROUTINE W REFLEX MICROSCOPIC
Bilirubin Urine: NEGATIVE
Glucose, UA: NEGATIVE mg/dL
Ketones, ur: 20 mg/dL — AB
Leukocytes,Ua: NEGATIVE
Nitrite: NEGATIVE
Protein, ur: NEGATIVE mg/dL
Specific Gravity, Urine: 1.008 (ref 1.005–1.030)
pH: 6 (ref 5.0–8.0)

## 2023-10-24 LAB — CBC WITH DIFFERENTIAL/PLATELET
Abs Immature Granulocytes: 0.03 10*3/uL (ref 0.00–0.07)
Basophils Absolute: 0.1 10*3/uL (ref 0.0–0.1)
Basophils Relative: 1 %
Eosinophils Absolute: 0 10*3/uL (ref 0.0–0.5)
Eosinophils Relative: 0 %
HCT: 42.5 % (ref 36.0–46.0)
Hemoglobin: 14.1 g/dL (ref 12.0–15.0)
Immature Granulocytes: 0 %
Lymphocytes Relative: 20 %
Lymphs Abs: 2 10*3/uL (ref 0.7–4.0)
MCH: 24.6 pg — ABNORMAL LOW (ref 26.0–34.0)
MCHC: 33.2 g/dL (ref 30.0–36.0)
MCV: 74.2 fL — ABNORMAL LOW (ref 80.0–100.0)
Monocytes Absolute: 1 10*3/uL (ref 0.1–1.0)
Monocytes Relative: 10 %
Neutro Abs: 7.1 10*3/uL (ref 1.7–7.7)
Neutrophils Relative %: 69 %
Platelets: 407 10*3/uL — ABNORMAL HIGH (ref 150–400)
RBC: 5.73 MIL/uL — ABNORMAL HIGH (ref 3.87–5.11)
RDW: 14.5 % (ref 11.5–15.5)
WBC: 10.2 10*3/uL (ref 4.0–10.5)
nRBC: 0 % (ref 0.0–0.2)

## 2023-10-24 LAB — LIPASE, BLOOD: Lipase: 31 U/L (ref 11–51)

## 2023-10-24 LAB — POC URINE PREG, ED: Preg Test, Ur: NEGATIVE

## 2023-10-24 LAB — MAGNESIUM: Magnesium: 2.2 mg/dL (ref 1.7–2.4)

## 2023-10-24 LAB — TROPONIN I (HIGH SENSITIVITY): Troponin I (High Sensitivity): 9 ng/L (ref <18)

## 2023-10-24 MED ORDER — POTASSIUM CHLORIDE CRYS ER 20 MEQ PO TBCR
40.0000 meq | EXTENDED_RELEASE_TABLET | Freq: Once | ORAL | Status: AC
  Administered 2023-10-24: 40 meq via ORAL
  Filled 2023-10-24: qty 2

## 2023-10-24 MED ORDER — LACTATED RINGERS IV BOLUS
1000.0000 mL | Freq: Once | INTRAVENOUS | Status: AC
  Administered 2023-10-24: 1000 mL via INTRAVENOUS

## 2023-10-24 MED ORDER — MORPHINE SULFATE (PF) 4 MG/ML IV SOLN
4.0000 mg | Freq: Once | INTRAVENOUS | Status: AC
  Administered 2023-10-24: 4 mg via INTRAVENOUS
  Filled 2023-10-24: qty 1

## 2023-10-24 MED ORDER — POTASSIUM CHLORIDE CRYS ER 20 MEQ PO TBCR: 20.0000 meq | EXTENDED_RELEASE_TABLET | Freq: Two times a day (BID) | ORAL | 0 refills | Status: DC

## 2023-10-24 MED ORDER — PROCHLORPERAZINE EDISYLATE 10 MG/2ML IJ SOLN
10.0000 mg | Freq: Once | INTRAMUSCULAR | Status: AC
  Administered 2023-10-24: 10 mg via INTRAVENOUS
  Filled 2023-10-24: qty 2

## 2023-10-24 MED ORDER — DROPERIDOL 2.5 MG/ML IJ SOLN: 2.5000 mg | Freq: Once | INTRAMUSCULAR | Status: DC

## 2023-10-24 MED ORDER — PROCHLORPERAZINE MALEATE 10 MG PO TABS: 10.0000 mg | ORAL_TABLET | Freq: Four times a day (QID) | ORAL | 0 refills | Status: DC | PRN

## 2023-10-24 NOTE — Discharge Instructions (Signed)
 Your testing today fortunately did not show emergency cause for your pain.  You can use the sucralfate  that you were previously prescribed and continue to take a PPI.  I have also sent a prescription for nausea medication to your pharmacy as well as a few additional days of potassium supplementation.  Follow-up with primary care doctor and GI for further evaluation.  Return to the ER for new or worsening symptoms.

## 2023-10-24 NOTE — ED Provider Notes (Signed)
 [facility] Provider Note    Event Date/Time   First MD Initiated Contact with Patient 10/24/23 1343     (approximate)   History   Chief Complaint Abdominal Pain and Emesis   HPI  [name] is a 38 y.o. female with past medical history of hypertension, diabetes, GERD, and sleeve gastrectomy who presents to the ED complaining of abdominal pain.  Patient reports that she has been dealing with pain in her epigastric area for about the past 5 days, has gotten acutely worse over the course of the day today.  She reports nausea with multiple episodes of vomiting, denies any associated diarrhea.  She has not had any fevers, dysuria, or flank pain.  She describes symptoms as similar to what she was seen for in the ED 5 days ago and workup was unremarkable at that time.  She denies alcohol or drug use.     Physical Exam   Triage Vital Signs: ED Triage Vitals [10/24/23 1338]  Encounter Vitals Group     BP      Systolic BP Percentile      Diastolic BP Percentile      Pulse      Resp      Temp      Temp src      SpO2 99 %     Weight      Height      Head Circumference      Peak Flow      Pain Score      Pain Loc      Pain Education      Exclude from Growth Chart     Most recent vital signs: Vitals:   10/24/23 1350 10/24/23 1400  BP:  (!) 183/125  Pulse: 95 90  Resp: (!) 31 16  Temp:    SpO2: 100% 97%    Constitutional: Alert and oriented. Eyes: Conjunctivae are normal. Head: Atraumatic. Nose: No congestion/rhinnorhea. Mouth/Throat: Mucous membranes are moist.  Cardiovascular: Normal rate, regular rhythm. Grossly normal heart sounds.  2+ radial pulses bilaterally. Respiratory: Normal respiratory effort.  No retractions. Lungs CTAB. Gastrointestinal: Soft and tender to palpation in the epigastrium with no rebound or guarding. No distention. Musculoskeletal: No lower extremity tenderness nor edema.  Neurologic:  Normal speech and  language. No gross focal neurologic deficits are appreciated.    ED Results / Procedures / Treatments   Labs (all labs ordered are listed, but only abnormal results are displayed) Labs Reviewed  CBC WITH DIFFERENTIAL/PLATELET - Abnormal; Notable for the following components:      Result Value   RBC 5.73 (*)    MCV 74.2 (*)    MCH 24.6 (*)    Platelets 407 (*)    All other components within normal limits  COMPREHENSIVE METABOLIC PANEL WITH GFR - Abnormal; Notable for the following components:   Sodium 133 (*)    Potassium 2.8 (*)    Chloride 94 (*)    CO2 19 (*)    Glucose, Bld 187 (*)    Total Protein 8.5 (*)    Anion gap 20 (*)    All other components within normal limits  LIPASE, BLOOD  URINALYSIS, ROUTINE W REFLEX MICROSCOPIC  MAGNESIUM   POC URINE PREG, ED     EKG  ED ECG REPORT I, Twilla Galea, the attending physician, personally viewed and interpreted this ECG.   Date: 10/24/2023  EKG Time: 14:10  Rate: 83  Rhythm: normal sinus rhythm,  occasional PVC noted, unifocal  Axis: Normal  Intervals:Prolonged QT  ST&T Change: None  PROCEDURES:  Critical Care performed: No  Procedures   MEDICATIONS ORDERED IN ED: Medications  lactated ringers  bolus 1,000 mL (has no administration in time range)  potassium chloride  SA (KLOR-CON  M) CR tablet 40 mEq (has no administration in time range)  prochlorperazine (COMPAZINE) injection 10 mg (10 mg Intravenous Given 10/24/23 1416)  morphine  (PF) 4 MG/ML injection 4 mg (4 mg Intravenous Given 10/24/23 1415)     IMPRESSION / MDM / ASSESSMENT AND PLAN / ED COURSE  I reviewed the triage vital signs and the nursing notes.                              38 y.o. female with past medical history of hypertension, diabetes, GERD, and sleeve gastrectomy who presents to the ED complaining of 5 days of epigastric abdominal pain with nausea and vomiting, acutely worse today.  Patient's presentation is most consistent with acute  presentation with potential threat to life or bodily function.  Differential diagnosis includes, but is not limited to, gastritis, pancreatitis, hepatitis, bowel obstruction, GERD, anemia, electrolyte abnormality, AKI, UTI.  Patient uncomfortable but nontoxic-appearing and in no acute distress, vital signs remarkable for tachypnea and hypertension.  Patient admitted to EMS that she has not been taking her antihypertensive medication recently.  I did review her ED visit from 4 days ago for similar symptoms, CT imaging at that time was unremarkable and do not feel repeat CT indicated at this time.  We will repeat labs and urinalysis, treat symptomatically with IV droperidol  as she had relief with this previously.  EKG noted to have prolonged QT, labs show anion gap acidosis without significant hyperglycemia, suspect starvation ketosis.  We will treat with IV Compazine given lesser effect on QT interval then droperidol , also treat pain with IV morphine .  Labs show hypokalemia, will add on magnesium  level and replete potassium.  No significant AKI, anemia, or leukocytosis noted.  LFTs and lipase are unremarkable.  Patient turned over to oncoming provider pending symptomatic treatment and reassessment.      FINAL CLINICAL IMPRESSION(S) / ED DIAGNOSES   Final diagnoses:  Epigastric pain  Nausea and vomiting, unspecified vomiting type     Rx / DC Orders   ED Discharge Orders     None        Note:  This document was prepared using Dragon voice recognition software and may include unintentional dictation errors.   Twilla Galea, MD 10/24/23 [phone]

## 2023-10-24 NOTE — ED Notes (Signed)
 Pt given ginger ale and saltine crackers. Explained to patient that the MD would like patient to trial eating/drinking. Pt acknowledged understanding.

## 2023-10-24 NOTE — ED Triage Notes (Signed)
 Per EMS  Abdomen pain ongoing Vomiting

## 2023-10-24 NOTE — ED Provider Notes (Signed)
 Care of this patient assumed from prior physician at 1500 pending p.o. trial and disposition.  Briefly this is a 38 year old female who presented with epigastric abdominal pain.  Seen in our ER 4 days ago for similar symptoms with reassuring CT imaging.  Repeat labs and urinalysis performed today.  These were notable for hypokalemia with potassium of 2.8 for which patient received oral patient, mild anion gap acidosis.  Urine without evidence of infection.  Trace ketones noted.  Normal white blood cell count, reassuring LFTs.  She was treated symptomatically with IV fluids, Compazine, morphine .  EKG did demonstrate prolonged QTc.  Signed out to me pending p.o. trial, anticipated discharge of tolerating intake.  Repeat EKG obtained given initial prolonged QTc.  This fortunately demonstrated improvement in her QTc to 452.  Patient reassessed and feels much improved.  She is tolerating p.o. intake without recurrent vomiting.  Do think she is stable for discharge.  DC with prescription for Compazine as she did seem to have improvement with this.  Was sent for sucralfate  at last ER visit.  Strict return precautions provided.    Claria Crofts, MD 10/24/23 1739

## 2023-10-25 ENCOUNTER — Telehealth: Payer: Self-pay | Admitting: Internal Medicine

## 2023-10-25 NOTE — Telephone Encounter (Signed)
 Inbound call from patient, states she had to go to the ED yesterday and also 5/7, with extreme abdominal pain, with nausea and vomiting, she states she is not having any relief and would like to speak with a nurse. Patient was scheduled for 5/19 with Deanna, but would like to speak to someone.

## 2023-10-25 NOTE — Telephone Encounter (Signed)
 Pt stated that she is having recurrent symptoms. Pt stated that she keeps having abdominal pain ( upper mid abdomen) nausea/vomiting. Hospitalized back in December, went to the ER on 10/20/2023 and 10/24/2023 and was notified to follow up with GI. Pt was scheduled to see Santina Cull PA on 10/27/2023 at 8:40 AM. Pt made aware.  Pt verbalized understanding with all questions answered.

## 2023-10-26 NOTE — Progress Notes (Unsigned)
 10/27/2023 [name] [id number] Nov 16, 1985  Referring provider: [name], MD Primary GI doctor: Dr. 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 Pylori CT abdomen pelvis with contrast 10/20/2023 vague hypodensities hepatic veins unchanged longstanding, normal pancreas normal spleen unremarkable stomach/bowel, patient had associated hypokalemia of 2.8, BUN and creatinine potentially slightly elevated but BUN only 11, total protein 8.5 and increased started new job and can not go back until she has no restrictions she is back on ozempic , increased dose recently, has not take since last Sunday.  - stop ozempic  - increase omeprazole  40 mg BID open up and sprinkle on food -Lifestyle changes discussed, avoid NSAIDS, ETOH, hand out given to the patient -Weight loss discussed with the patient I discussed risks of EGD with patient today, including risk of sedation, bleeding or perforation.  Patient provides understanding and gave verbal consent to proceed. 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  Microcytosis with MCV 74.2 previous Hgb low 4 months ago 11.6 B12 deficiency 7 years ago at 144 Ferritin 22 9 years ago has not been rechecked Recheck B12, iron, ferritin  Diverticulosis Seen on CT abdomen pelvis 05/28/2023  Hypokalemia no diarrhea, not on diuretic, some nausea/vomiting potentially from that Potassium 2.8  Magnesium  2.2  Consider work up for primary aldosteronism and treatment with  spironolactone Will need potassium check 2 days prior to EGD to assure she is okay for LEC  Hypertension Follow up with PCP No CP, SOB, headaches, changes in vision     Patient Care Team: [name], MD as PCP - General (Family Medicine) Constancia Delton, MD as PCP - Cardiology (Cardiology)  HISTORY OF PRESENT ILLNESS: 38 y.o. female with a past medical history listed below presents for evaluation of epigastric pain and ER visit.   Last seen 06/03/2023 by Dr. Rosaline Coma for GERD, status post gastric sleeve surgery, continued on omeprazole  restarted on Ozempic  declined EGD at that time.  Discussed the use of AI scribe software for clinical note transcription with the patient, who gave verbal consent to proceed.  History of Present Illness   [name] is a 38 year old female with a history of gastric sleeve surgery who presents with nausea, vomiting, and constipation.  She has been experiencing significant gastrointestinal symptoms, including nausea and vomiting, particularly after eating. Nausea occurs with every meal, often leading to vomiting, sometimes with regurgitation of yellow bile and occasionally light red blood. The pain is located in the epigastric region and worsens with food intake, causing her to avoid eating. These symptoms have persisted since her gastric sleeve surgery in October 2016.  She has a history of constipation since her gastric sleeve surgery, with bowel movements occurring once a week, sometimes twice with the use of a laxative. Her constipation has been persistent and problematic. She has tried Miralax with some effect and has also used Linzess, which she reports works for her.  She has a history of hypertension and was previously on blood pressure medication before her gastric sleeve surgery, after which she was taken  off the medication. Recently, her blood pressure has been high again, and she has been in and out of the hospital over the past week. She  is not currently on a diuretic but is taking a potassium supplement.  She underwent an endoscopy in 2017, which revealed a medium-sized hiatal hernia and grade C esophagitis. Her recent CT scan showed vague hypodensities in the hepatic veins, which have been unchanged over time, and a normal pancreas, spleen, stomach, and bowel. She has been on omeprazole  40 mg daily since her gastric sleeve surgery due to chronic heartburn and reflux symptoms.  Her social history includes occasional alcohol consumption, approximately once every other week, and she does not smoke or use drugs. She recently started a new job at [employer] but has been unable to attend training due to her hospitalizations. She wants to return to work.        She  reports that she has never smoked. She has never used smokeless tobacco. She reports current alcohol use. She reports that she does not use drugs.  RELEVANT GI HISTORY, IMAGING AND LABS: Results   LABS Potassium: Low (10/20/2023) B12: 144 (2018) Macrocytosis: Low (06/2023)  RADIOLOGY CT scan: Vague hypodensities in hepatic veins, normal pancreas, normal spleen, normal stomach, normal bowel (10/20/2023)  DIAGNOSTIC Endoscopy: Medium-sized hiatal hernia, grade C esophagitis, negative H. pylori, normal small intestinal biopsies (2017)      CBC    Component Value Date/Time   WBC 10.2 10/24/2023 1351   RBC 5.73 (H) 10/24/2023 1351   HGB 14.1 10/24/2023 1351   HGB 12.6 06/11/2014 1155   HCT 42.5 10/24/2023 1351   HCT 39.7 06/11/2014 1155   PLT 407 (H) 10/24/2023 1351   PLT 261 06/11/2014 1155   MCV 74.2 (L) 10/24/2023 1351   MCV 78 (L) 06/11/2014 1155   MCH 24.6 (L) 10/24/2023 1351   MCHC 33.2 10/24/2023 1351   RDW 14.5 10/24/2023 1351   RDW 15.9 (H) 06/11/2014 1155   LYMPHSABS 2.0 10/24/2023 1351   LYMPHSABS 3.5 06/11/2014 1155   MONOABS 1.0 10/24/2023 1351   MONOABS 0.8 06/11/2014 1155   EOSABS 0.0 10/24/2023 1351   EOSABS 0.1 06/11/2014 1155   BASOSABS  0.1 10/24/2023 1351   BASOSABS 0.1 06/11/2014 1155   Recent Labs    05/26/23 1516 05/28/23 1612 05/29/23 0602 05/30/23 0532 06/01/23 0851 10/19/23 2002 10/20/23 0246 10/24/23 1351  HGB 12.8 15.2* 12.7 11.6* 12.1 13.0 13.8 14.1    CMP     Component Value Date/Time   NA 133 (L) 10/24/2023 1351   NA 135 (L) 06/11/2014 1155   K 2.8 (L) 10/24/2023 1351   K 4.0 06/11/2014 1155   CL 94 (L) 10/24/2023 1351   CL 101 06/11/2014 1155   CO2 19 (L) 10/24/2023 1351   CO2 27 06/11/2014 1155   GLUCOSE 187 (H) 10/24/2023 1351   GLUCOSE 318 (H) 06/11/2014 1155   BUN 11 10/24/2023 1351   BUN 5 (L) 06/11/2014 1155   CREATININE 0.89 10/24/2023 1351   CREATININE 0.88 05/14/2017 1559   CALCIUM  9.6 10/24/2023 1351   CALCIUM  9.0 06/11/2014 1155   PROT 8.5 (H) 10/24/2023 1351   PROT 7.6 06/11/2014 1155   ALBUMIN 4.5 10/24/2023 1351   ALBUMIN 3.3 (L) 06/11/2014 1155   AST 22 10/24/2023 1351   AST 15 06/11/2014 1155   ALT 14 10/24/2023 1351   ALT 23 06/11/2014 1155   ALKPHOS 51 10/24/2023 1351   ALKPHOS 88 06/11/2014 1155   BILITOT  1.1 10/24/2023 1351   BILITOT 0.2 06/11/2014 1155   GFRNONAA >60 10/24/2023 1351   GFRNONAA >60 06/11/2014 1155   GFRNONAA >60 01/09/2014 1102   GFRAA >60 01/29/2017 1725   GFRAA >60 06/11/2014 1155   GFRAA >60 01/09/2014 1102      Latest Ref Rng & Units 10/24/2023    1:51 PM 10/20/2023    2:46 AM 10/19/2023    8:02 PM  Hepatic Function  Total Protein 6.5 - 8.1 g/dL 8.5  9.6  8.3   Albumin 3.5 - 5.0 g/dL 4.5  4.7  4.4   AST 15 - 41 U/L 22  21  15    ALT 0 - 44 U/L 14  14  14    Alk Phosphatase 38 - 126 U/L 51  55  47   Total Bilirubin 0.0 - 1.2 mg/dL 1.1  0.6  0.7       Current Medications:   Current Outpatient Medications (Endocrine & Metabolic):    etonogestrel  (NEXPLANON ) 68 MG IMPL implant, 1 each by Subdermal route once.   Semaglutide ,0.25 or 0.5MG /DOS, (OZEMPIC , 0.25 OR 0.5 MG/DOSE,) 2 MG/3ML SOPN, Inject 0.5 mg into the skin once a  week.  Current Outpatient Medications (Cardiovascular):    rosuvastatin  (CRESTOR ) 5 MG tablet, Take 1 tablet (5 mg total) by mouth daily.     Current Outpatient Medications (Other):    linaclotide (LINZESS) 145 MCG CAPS capsule, Take 1 capsule (145 mcg total) by mouth daily before breakfast.   omeprazole  (PRILOSEC) 40 MG capsule, Take 1 capsule (40 mg total) by mouth 2 (two) times daily before a meal.   potassium chloride  SA (KLOR-CON  M) 20 MEQ tablet, Take 1 tablet (20 mEq total) by mouth 2 (two) times daily for 3 days.   prochlorperazine (COMPAZINE) 10 MG tablet, Take 1 tablet (10 mg total) by mouth every 6 (six) hours as needed for nausea or vomiting.   sucralfate  (CARAFATE ) 1 GM/10ML suspension, Take 10 mLs (1 g total) by mouth 4 (four) times daily.  Medical History:  Past Medical History:  Diagnosis Date   Diabetes mellitus without complication (HCC)    before gastric sleeve procedure   GERD (gastroesophageal reflux disease)    Headache    when BP is up   Hypertension    Wears contact lenses    Allergies:  Allergies  Allergen Reactions   Tomato Rash     Surgical History:  She  has a past surgical history that includes Cesarean section (04/30/2009); Cholecystectomy; Laparoscopic gastric sleeve resection (N/A, 03/19/2015); and Esophagogastroduodenoscopy (egd) with propofol  (N/A, 10/24/2015). Family History:  Her family history includes Birth defects in her sister; Breast cancer (age of onset: 58) in her cousin, maternal aunt, and maternal aunt; Diabetes in her father; Hyperlipidemia in her father; Hypertension in her father and sister; Prostate cancer in her paternal grandfather.  REVIEW OF SYSTEMS  : All other systems reviewed and negative except where noted in the History of Present Illness.  PHYSICAL EXAM: BP (!) 140/84   Pulse 94   Ht 5\' 6"  (1.676 m)   Wt 202 lb 12.8 oz (92 kg)   LMP 10/20/2023 (Approximate)   BMI 32.73 kg/m  Physical Exam   GENERAL APPEARANCE:  Well nourished, in no apparent distress. HEENT: No cervical lymphadenopathy, unremarkable thyroid , sclerae anicteric, conjunctiva pink. RESPIRATORY: Respiratory effort normal, BS equal bilateral without rales, rhonchi, wheezing. CARDIO: RRR with no MRGs, peripheral pulses intact. ABDOMEN: Soft, non distended, active bowel sounds in all 4 quadrants, no tenderness  to palpation, no rebound, no mass appreciated. Abdomen normal on examination. RECTAL: Declines. MUSCULOSKELETAL: Full ROM, normal gait, without edema. SKIN: Dry, intact without rashes or lesions. No jaundice. NEURO: Alert, oriented, no focal deficits. PSYCH: Cooperative, normal mood and affect.      Edmonia Gottron, PA-C 10:23 AM

## 2023-10-27 ENCOUNTER — Ambulatory Visit (INDEPENDENT_AMBULATORY_CARE_PROVIDER_SITE_OTHER): Admitting: Physician Assistant

## 2023-10-27 ENCOUNTER — Other Ambulatory Visit (INDEPENDENT_AMBULATORY_CARE_PROVIDER_SITE_OTHER)

## 2023-10-27 ENCOUNTER — Other Ambulatory Visit: Payer: Self-pay | Admitting: *Deleted

## 2023-10-27 ENCOUNTER — Encounter: Payer: Self-pay | Admitting: Physician Assistant

## 2023-10-27 VITALS — BP 140/84 | HR 94 | Ht 66.0 in | Wt 202.8 lb

## 2023-10-27 DIAGNOSIS — R1310 Dysphagia, unspecified: Secondary | ICD-10-CM

## 2023-10-27 DIAGNOSIS — R112 Nausea with vomiting, unspecified: Secondary | ICD-10-CM

## 2023-10-27 DIAGNOSIS — E538 Deficiency of other specified B group vitamins: Secondary | ICD-10-CM | POA: Diagnosis not present

## 2023-10-27 DIAGNOSIS — Z9049 Acquired absence of other specified parts of digestive tract: Secondary | ICD-10-CM

## 2023-10-27 DIAGNOSIS — R718 Other abnormality of red blood cells: Secondary | ICD-10-CM

## 2023-10-27 DIAGNOSIS — Z8719 Personal history of other diseases of the digestive system: Secondary | ICD-10-CM

## 2023-10-27 DIAGNOSIS — Z9884 Bariatric surgery status: Secondary | ICD-10-CM

## 2023-10-27 DIAGNOSIS — K5900 Constipation, unspecified: Secondary | ICD-10-CM

## 2023-10-27 DIAGNOSIS — D509 Iron deficiency anemia, unspecified: Secondary | ICD-10-CM | POA: Diagnosis not present

## 2023-10-27 DIAGNOSIS — K5790 Diverticulosis of intestine, part unspecified, without perforation or abscess without bleeding: Secondary | ICD-10-CM

## 2023-10-27 DIAGNOSIS — K219 Gastro-esophageal reflux disease without esophagitis: Secondary | ICD-10-CM

## 2023-10-27 DIAGNOSIS — R1013 Epigastric pain: Secondary | ICD-10-CM | POA: Diagnosis not present

## 2023-10-27 DIAGNOSIS — E119 Type 2 diabetes mellitus without complications: Secondary | ICD-10-CM

## 2023-10-27 DIAGNOSIS — E876 Hypokalemia: Secondary | ICD-10-CM

## 2023-10-27 LAB — COMPREHENSIVE METABOLIC PANEL WITH GFR
ALT: 10 U/L (ref 0–35)
AST: 12 U/L (ref 0–37)
Albumin: 4.5 g/dL (ref 3.5–5.2)
Alkaline Phosphatase: 47 U/L (ref 39–117)
BUN: 6 mg/dL (ref 6–23)
CO2: 26 meq/L (ref 19–32)
Calcium: 9.4 mg/dL (ref 8.4–10.5)
Chloride: 101 meq/L (ref 96–112)
Creatinine, Ser: 0.79 mg/dL (ref 0.40–1.20)
GFR: 96.83 mL/min (ref >60.00)
Glucose, Bld: 82 mg/dL (ref 70–99)
Potassium: 3.9 meq/L (ref 3.5–5.1)
Sodium: 136 meq/L (ref 135–145)
Total Bilirubin: 0.3 mg/dL (ref 0.2–1.2)
Total Protein: 7.8 g/dL (ref 6.0–8.3)

## 2023-10-27 LAB — FOLATE: Folate: 11.7 ng/mL (ref >5.9)

## 2023-10-27 LAB — IBC + FERRITIN
Ferritin: 25.9 ng/mL (ref 10.0–291.0)
Iron: 43 ug/dL (ref 42–145)
Saturation Ratios: 9.6 % — ABNORMAL LOW (ref 20.0–50.0)
TIBC: 449.4 ug/dL (ref 250.0–450.0)
Transferrin: 321 mg/dL (ref 212.0–360.0)

## 2023-10-27 LAB — VITAMIN B12: Vitamin B-12: 199 pg/mL — ABNORMAL LOW (ref 211–911)

## 2023-10-27 MED ORDER — LINACLOTIDE 145 MCG PO CAPS
145.0000 ug | ORAL_CAPSULE | Freq: Every day | ORAL | 3 refills | Status: AC
Start: 2023-10-27 — End: 2024-10-21

## 2023-10-27 MED ORDER — OMEPRAZOLE 40 MG PO CPDR: 40.0000 mg | DELAYED_RELEASE_CAPSULE | Freq: Two times a day (BID) | ORAL | 0 refills | Status: DC

## 2023-10-27 NOTE — Patient Instructions (Addendum)
 Your provider has requested that you go to the basement level for lab work before leaving today. Press "B" on the elevator. The lab is located at the first door on the left as you exit the elevator.  Please take your proton pump inhibitor medication, omeprazole  40 mg to TWICE a day  Please take this medication 30 minutes to 1 hour before meals- this makes it more effective. Can break it open for 6-8 weeks.  Avoid spicy and acidic foods Avoid fatty foods Limit your intake of coffee, tea, alcohol, and carbonated drinks Work to maintain a healthy weight Keep the head of the bed elevated at least 3 inches with blocks or a wedge pillow if you are having any nighttime symptoms Stay upright for 2 hours after eating Avoid meals and snacks three to four hours before bedtime   Linzess 145 mcg  *IBS-C patients may begin to experience relief from belly pain and overall abdominal symptoms (pain, discomfort, and bloating) in about 1 week,  with symptoms typically improving over 12 weeks.  Take at least 30 minutes before the first meal of the day on an empty stomach You can have a loose stool if you eat a high-fat breakfast. Give it at least 7 days, may have more bowel movements during that time.   The diarrhea should go away and you should start having normal, complete, full bowel movements.  It may be helpful to start treatment when you can be near the comfort of your own bathroom, such as a weekend.  After you are out we can send in a prescription if you did well, there is a prescription card  Toileting tips to help with your constipation - Drink at least 64-80 ounces of water/liquid per day. - Establish a time to try to move your bowels every day.  For many people, this is after a cup of coffee or after a meal such as breakfast. - Sit all of the way back on the toilet keeping your back fairly straight and while sitting up, try to rest the tops of your forearms on your upper thighs.   - Raising your  feet with a step stool/squatty potty can be helpful to improve the angle that allows your stool to pass through the rectum. - Relax the rectum feeling it bulge toward the toilet water.  If you feel your rectum raising toward your body, you are contracting rather than relaxing. - Breathe in and slowly exhale. "Belly breath" by expanding your belly towards your belly button. Keep belly expanded as you gently direct pressure down and back to the anus.  A low pitched GRRR sound can assist with increasing intra-abdominal pressure.  (Can also trying to blow on a pinwheel and make it move, this helps with the same belly breathing) - Repeat 3-4 times. If unsuccessful, contract the pelvic floor to restore normal tone and get off the toilet.  Avoid excessive straining. - To reduce excessive wiping by teaching your anus to normally contract, place hands on outer aspect of knees and resist knee movement outward.  Hold 5-10 second then place hands just inside of knees and resist inward movement of knees.  Hold 5 seconds.  Repeat a few times each way.  Go to the ER if unable to pass gas, severe AB pain, unable to hold down food, any shortness of breath of chest pain.   Reflux Gourmet Rescue  It is an ALGINATE THERAPY which is the only intervention that works to safeguard the esophagus  by creating a protective barrier that actually stops reflux from happening. -The general directions for use are as stated on the packaging: Take 1 teaspoon (5 ml), or more as needed or as directed by your physician, after meals and before bed. -These general directions address the most common times for reflux to occur, but our Rescue products may be taken anytime. Some individuals may take our product preemptively, when they know they will suffer from reflux, or as needed - when discomfort arises. (If taken around food, it should be consumed last.) -You do not have to take 1 teaspoon (5 ml) of the product. While one teaspoon (5ml) may  be the perfect average amount to relieve reflux suffering in some, others may require more or less. You may adjust the amount of Mint Chocolate Rescue and Vanilla Caramel Rescue to the lowest amount necessary to meet your individual needs to improve your quality of life. -You may dilute the product if it is too viscous for you to consume. Keep in mind, however, that the thickness of the product was formulated to provide optimal coating and protection of your throat and esophagus. Though diluting the product is possible, it may reduce the protective function and/or length of action. -This can be used in conjunction with reflux medications and lifestyle changes.  100% ALL-NATURAL  Paraben FREE, glycerin FREE, & potassium FREE  Made entirely from all-natural ingredients considered safe for children and during pregnancy  No known side effects  All-natural flavor Gluten FREE  Allergen FREE  Vegan  Can find more information here: NameSeizer.co.nz    Gastroparesis Please do small frequent meals like 4-6 meals a day.  Eat and drink liquids at separate times.  Avoid high fiber foods, cook your vegetables, avoid high fat food.  Suggest spreading protein throughout the day (greek yogurt, glucerna, soft meat, milk, eggs) Choose soft foods that you can mash with a fork When you are more symptomatic, change to pureed foods foods and liquids.  Consider reading "Living well with Gastroparesis" by Creasie Doctor Gastroparesis is a condition in which food takes longer than normal to empty from the stomach. This condition is also known as delayed gastric emptying. It is usually a long-term (chronic) condition. There is no cure, but there are treatments and things that you can do at home to help relieve symptoms. Treating the underlying condition that causes gastroparesis can also help relieve symptoms What are the causes? In many cases, the cause of this condition is  not known. Possible causes include: A hormone (endocrine) disorder, such as hypothyroidism or diabetes. A nervous system disease, such as Parkinson's disease or multiple sclerosis. Cancer, infection, or surgery that affects the stomach or vagus nerve. The vagus nerve runs from your chest, through your neck, and to the lower part of your brain. A connective tissue disorder, such as scleroderma. Certain medicines. What increases the risk? You are more likely to develop this condition if: You have certain disorders or diseases. These may include: An endocrine disorder. An eating disorder. Amyloidosis. Scleroderma. Parkinson's disease. Multiple sclerosis. Cancer or infection of the stomach or the vagus nerve. You have had surgery on your stomach or vagus nerve. You take certain medicines. You are female. What are the signs or symptoms? Symptoms of this condition include: Feeling full after eating very little or a loss of appetite. Nausea, vomiting, or heartburn. Bloating of your abdomen. Inconsistent blood sugar (glucose) levels on blood tests. Unexplained weight loss. Acid from the stomach coming up into the  esophagus (gastroesophageal reflux). Sudden tightening (spasm) of the stomach, which can be painful. Symptoms may come and go. Some people may not notice any symptoms. How is this diagnosed? This condition is diagnosed with tests, such as: Tests that check how long it takes food to move through the stomach and intestines. These tests include: Upper gastrointestinal (GI) series. For this test, you drink a liquid that shows up well on X-rays, and then X-rays are taken of your intestines. Gastric emptying scintigraphy. For this test, you eat food that contains a small amount of radioactive material, and then scans are taken. Wireless capsule GI monitoring system. For this test, you swallow a pill (capsule) that records information about how foods and fluid move through your  stomach. Gastric manometry. For this test, a tube is passed down your throat and into your stomach to measure electrical and muscular activity. Endoscopy. For this test, a long, thin tube with a camera and light on the end is passed down your throat and into your stomach to check for problems in your stomach lining. Ultrasound. This test uses sound waves to create images of the inside of your body. This can help rule out gallbladder disease or pancreatitis as a cause of your symptoms. How is this treated? There is no cure for this condition, but treatment and home care may relieve symptoms. Treatment may include: Treating the underlying cause. Managing your symptoms by making changes to your diet and exercise habits. Taking medicines to control nausea and vomiting and to stimulate stomach muscles. Getting food through a feeding tube in the hospital. This may be done in severe cases. Having surgery to insert a device called a gastric electrical stimulator into your body. This device helps improve stomach emptying and control nausea and vomiting. Follow these instructions at home: Take over-the-counter and prescription medicines only as told by your health care provider. Follow instructions from your health care provider about eating or drinking restrictions. Your health care provider may recommend that you: Eat smaller meals more often. Eat low-fat foods. Eat low-fiber forms of high-fiber foods. For example, eat cooked vegetables instead of raw vegetables. Have only liquid foods instead of solid foods. Liquid foods are easier to digest. Drink enough fluid to keep your urine pale yellow. Exercise as often as told by your health care provider. Keep all follow-up visits. This is important. Contact a health care provider if you: Notice that your symptoms do not improve with treatment. Have new symptoms. Get help right away if you: Have severe pain in your abdomen that does not improve with  treatment. Have nausea that is severe or does not go away. Vomit every time you drink fluids. Summary Gastroparesis is a long-term (chronic) condition in which food takes longer than normal to empty from the stomach. Symptoms include nausea, vomiting, heartburn, bloating of your abdomen, and loss of appetite. Eating smaller portions, low-fat foods, and low-fiber forms of high-fiber foods may help you manage your symptoms. Get help right away if you have severe pain in your abdomen. This information is not intended to replace advice given to you by your health care provider. Make sure you discuss any questions you have with your health care provider. Document Revised: 10/09/2019 Document Reviewed: 10/09/2019 Elsevier Patient Education  2021 [employer] I need you to get a recheck your potassium  You have been scheduled for an endoscopy. Please follow written instructions given to you at your visit today.  If you use inhalers (even only as needed), please  bring them with you on the day of your procedure.  If you take any of the following medications, they will need to be adjusted prior to your procedure:   DO NOT TAKE 7 DAYS PRIOR TO TEST- Trulicity (dulaglutide) Ozempic , Wegovy (semaglutide ) Mounjaro (tirzepatide) Bydureon  Bcise (exanatide extended release)  DO NOT TAKE 1 DAY PRIOR TO YOUR TEST Rybelsus (semaglutide ) Adlyxin (lixisenatide) Victoza  (liraglutide ) Byetta  (exanatide) ___________________________________________________________________________    Due to recent changes in healthcare laws, you may see the results of your imaging and laboratory studies on MyChart before your provider has had a chance to review them.  We understand that in some cases there may be results that are confusing or concerning to you. Not all laboratory results come back in the same time frame and the provider may be waiting for multiple results in order to interpret others.  Please give us  48  hours in order for your provider to thoroughly review all the results before contacting the office for clarification of your results.    I appreciate the  opportunity to care for you  Thank You   Kavitha Nandigam , MD

## 2023-10-28 LAB — IGA: Immunoglobulin A: 162 mg/dL (ref 47–310)

## 2023-10-28 LAB — TISSUE TRANSGLUTAMINASE, IGA: (tTG) Ab, IgA: <1 U/mL

## 2023-10-29 ENCOUNTER — Encounter: Payer: Self-pay | Admitting: Family Medicine

## 2023-10-29 ENCOUNTER — Ambulatory Visit: Payer: Self-pay | Admitting: Physician Assistant

## 2023-10-29 DIAGNOSIS — E538 Deficiency of other specified B group vitamins: Secondary | ICD-10-CM | POA: Insufficient documentation

## 2023-11-01 ENCOUNTER — Ambulatory Visit: Admitting: Gastroenterology

## 2023-11-01 NOTE — Progress Notes (Signed)
 I agree with the assessment and plan as outlined by Ms. Steffanie Dunn.

## 2023-11-02 ENCOUNTER — Encounter: Payer: Self-pay | Admitting: Internal Medicine

## 2023-11-03 ENCOUNTER — Telehealth: Payer: Self-pay

## 2023-11-03 ENCOUNTER — Other Ambulatory Visit (HOSPITAL_COMMUNITY): Payer: Self-pay

## 2023-11-03 NOTE — Telephone Encounter (Signed)
 Pharmacy Patient Advocate Encounter   Received notification from CoverMyMeds that prior authorization for Ozempic  (0.25 or 0.5 MG/DOSE) 2MG /3ML pen-injectors is required/requested.   Insurance verification completed.   The patient is insured through [facility] .   Per test claim: PA required; PA submitted to above mentioned insurance via CoverMyMeds Key/confirmation #/EOC [id number] Status is pending   -Sent via [employer]

## 2023-11-04 ENCOUNTER — Other Ambulatory Visit (HOSPITAL_COMMUNITY): Payer: Self-pay

## 2023-11-05 ENCOUNTER — Other Ambulatory Visit (HOSPITAL_COMMUNITY): Payer: Self-pay

## 2023-11-09 ENCOUNTER — Other Ambulatory Visit (HOSPITAL_COMMUNITY): Payer: Self-pay

## 2023-11-10 ENCOUNTER — Other Ambulatory Visit (HOSPITAL_COMMUNITY): Payer: Self-pay

## 2023-11-10 NOTE — Telephone Encounter (Signed)
 PA denied waiting on insurance to fax a PA form for additional documentation.

## 2023-11-11 ENCOUNTER — Telehealth: Payer: Self-pay | Admitting: *Deleted

## 2023-11-11 ENCOUNTER — Other Ambulatory Visit (HOSPITAL_COMMUNITY): Payer: Self-pay

## 2023-11-11 NOTE — Telephone Encounter (Signed)
 Called patient and had to leave a message for her to come in and have her labs drawn 2 days before her EGD with Dr Rosaline Coma.  Per Santina Cull.  Explained on voicemail that she has the rest on today and tomorrow to get this done.

## 2023-11-11 NOTE — Telephone Encounter (Signed)
 SENT ADDITIONAL INFO

## 2023-11-12 ENCOUNTER — Other Ambulatory Visit (INDEPENDENT_AMBULATORY_CARE_PROVIDER_SITE_OTHER)

## 2023-11-12 ENCOUNTER — Ambulatory Visit: Payer: Self-pay | Admitting: Physician Assistant

## 2023-11-12 DIAGNOSIS — E876 Hypokalemia: Secondary | ICD-10-CM | POA: Diagnosis not present

## 2023-11-12 LAB — POTASSIUM: Potassium: 3.9 meq/L (ref 3.5–5.1)

## 2023-11-15 ENCOUNTER — Encounter: Payer: Self-pay | Admitting: Internal Medicine

## 2023-11-15 ENCOUNTER — Ambulatory Visit: Admitting: Internal Medicine

## 2023-11-15 VITALS — BP 161/98 | HR 77 | Temp 97.9°F | Resp 14 | Ht 66.0 in | Wt 202.0 lb

## 2023-11-15 DIAGNOSIS — E538 Deficiency of other specified B group vitamins: Secondary | ICD-10-CM

## 2023-11-15 DIAGNOSIS — Z8719 Personal history of other diseases of the digestive system: Secondary | ICD-10-CM

## 2023-11-15 DIAGNOSIS — K3189 Other diseases of stomach and duodenum: Secondary | ICD-10-CM | POA: Diagnosis not present

## 2023-11-15 DIAGNOSIS — K219 Gastro-esophageal reflux disease without esophagitis: Secondary | ICD-10-CM

## 2023-11-15 DIAGNOSIS — K449 Diaphragmatic hernia without obstruction or gangrene: Secondary | ICD-10-CM

## 2023-11-15 DIAGNOSIS — K317 Polyp of stomach and duodenum: Secondary | ICD-10-CM | POA: Diagnosis present

## 2023-11-15 DIAGNOSIS — R112 Nausea with vomiting, unspecified: Secondary | ICD-10-CM

## 2023-11-15 DIAGNOSIS — D509 Iron deficiency anemia, unspecified: Secondary | ICD-10-CM

## 2023-11-15 MED ORDER — SODIUM CHLORIDE 0.9 % IV SOLN: 500.0000 mL | Freq: Once | INTRAVENOUS | Status: DC

## 2023-11-15 NOTE — Patient Instructions (Addendum)
 Resume previous diet Continue present medications Await pathology results Office visit with Dr Rosaline Coma on Friday 01/21/24 at 830 AM  Handouts/information given for Hiatal Hernia  YOU HAD AN ENDOSCOPIC PROCEDURE TODAY AT THE [facility]:   Refer to the procedure report that was given to you for any specific questions about what was found during the examination.  If the procedure report does not answer your questions, please call your gastroenterologist to clarify.  If you requested that your care partner not be given the details of your procedure findings, then the procedure report has been included in a sealed envelope for you to review at your convenience later.  YOU SHOULD EXPECT: Some feelings of bloating in the abdomen. Passage of more gas than usual.  Walking can help get rid of the air that was put into your GI tract during the procedure and reduce the bloating. If you had a lower endoscopy (such as a colonoscopy or flexible sigmoidoscopy) you may notice spotting of blood in your stool or on the toilet paper. If you underwent a bowel prep for your procedure, you may not have a normal bowel movement for a few days.  Please Note:  You might notice some irritation and congestion in your nose or some drainage.  This is from the oxygen used during your procedure.  There is no need for concern and it should clear up in a day or so.  SYMPTOMS TO REPORT IMMEDIATELY:  Following upper endoscopy (EGD)  Vomiting of blood or coffee ground material  New chest pain or pain under the shoulder blades  Painful or persistently difficult swallowing  New shortness of breath  Fever of 100F or higher  Black, tarry-looking stools For urgent or emergent issues, a gastroenterologist can be reached at any hour by calling [phone]. Do not use MyChart messaging for urgent concerns.   DIET:  We do recommend a small meal at first, but then you may proceed to your regular diet.  Drink plenty of  fluids but you should avoid alcoholic beverages for 24 hours.  ACTIVITY:  You should plan to take it easy for the rest of today and you should NOT DRIVE or use heavy machinery until tomorrow (because of the sedation medicines used during the test).    FOLLOW UP: Our staff will call the number listed on your records the next business day following your procedure.  We will call around 7:15- 8:00 am to check on you and address any questions or concerns that you may have regarding the information given to you following your procedure. If we do not reach you, we will leave a message.     If any biopsies were taken you will be contacted by phone or by letter within the next 1-3 weeks.  Please call us  at [phone] if you have not heard about the biopsies in 3 weeks.   SIGNATURES/CONFIDENTIALITY: You and/or your care partner have signed paperwork which will be entered into your electronic medical record.  These signatures attest to the fact that that the information above on your After Visit Summary has been reviewed and is understood.  Full responsibility of the confidentiality of this discharge information lies with you and/or your care-partner.

## 2023-11-15 NOTE — Op Note (Signed)
 [facility] Patient Name: [name] Procedure Date: 11/15/2023 3:52 PM MRN: [id number] Endoscopist: Freada Jacobs Keeler , , [id number] Age: 38 Referring MD:  Date of Birth: Dec 19, 1985 Gender: Female Account #: [id number] Procedure:                Upper GI endoscopy Indications:              Epigastric abdominal pain, Heartburn, Nausea with                            vomiting, vitamin B12 and iron deficiency Medicines:                Monitored Anesthesia Care Procedure:                Pre-Anesthesia Assessment:                           - Prior to the procedure, a History and Physical                            was performed, and patient medications and                            allergies were reviewed. The patient's tolerance of                            previous anesthesia was also reviewed. The risks                            and benefits of the procedure and the sedation                            options and risks were discussed with the patient.                            All questions were answered, and informed consent                            was obtained. Prior Anticoagulants: The patient has                            taken no anticoagulant or antiplatelet agents. ASA                            Grade Assessment: II - A patient with mild systemic                            disease. After reviewing the risks and benefits,                            the patient was deemed in satisfactory condition to                            undergo the procedure.  After obtaining informed consent, the endoscope was                            passed under direct vision. Throughout the                            procedure, the patient's blood pressure, pulse, and                            oxygen saturations were monitored continuously. The                            GIF [id number] #[id number] was introduced through the                            mouth, and  advanced to the second part of duodenum.                            The upper GI endoscopy was accomplished without                            difficulty. The patient tolerated the procedure                            well. Scope In: Scope Out: Findings:                 The examined esophagus was normal.                           A 2 cm hiatal hernia was present.                           Evidence of a sleeve gastrectomy was found in the                            gastric body. This was characterized by healthy                            appearing mucosa.                           A single 10 mm sessile polyp with no bleeding and                            no stigmata of recent bleeding was found in the                            gastric antrum. The polyp was removed with a cold                            snare. Resection and retrieval were complete.                           Localized mildly erythematous  mucosa without                            bleeding was found in the gastric antrum. Biopsies                            were taken with a cold forceps for histology.                           A single 5 mm sessile polyp with no bleeding was                            found in the duodenal bulb. The polyp was removed                            with a cold snare. Resection and retrieval were                            complete. Complications:            No immediate complications. Estimated Blood Loss:     Estimated blood loss was minimal. Impression:               - Normal esophagus.                           - 2 cm hiatal hernia.                           - A sleeve gastrectomy was found, characterized by                            healthy appearing mucosa.                           - A single gastric polyp. Resected and retrieved.                           - Erythematous mucosa in the antrum. Biopsied.                           - A single duodenal polyp. Resected and  retrieved. Recommendation:           - Discharge patient to home (with escort).                           - Await pathology results.                           - Return to GI clinic in 2-3 months.                           - The findings and recommendations were discussed                            with the patient. Dr Pedro Bourgeois "[address] [place],  11/15/2023 4:14:56 PM

## 2023-11-15 NOTE — Progress Notes (Signed)
 Patient's blood pressure elevated today; spoke with patient regarding her blood pressure and she stated she has a follow up visit with her primary care doctor tomorrow to evaluate blood pressure.

## 2023-11-15 NOTE — Progress Notes (Signed)
 Called to room to assist during endoscopic procedure.  Patient ID and intended procedure confirmed with present staff. Received instructions for my participation in the procedure from the performing physician.

## 2023-11-15 NOTE — Progress Notes (Signed)
 Pt's states no medical or surgical changes since previsit or office visit.

## 2023-11-15 NOTE — Progress Notes (Signed)
 GASTROENTEROLOGY PROCEDURE H&P NOTE   Primary Care Physician: [name], MD    Reason for Procedure:   GERD, epigastric ab pain, N&V, s/p gastric sleeve  Plan:    EGD  Patient is appropriate for endoscopic procedure(s) in the ambulatory ([facility]) setting.  The nature of the procedure, as well as the risks, benefits, and alternatives were carefully and thoroughly reviewed with the patient. Ample time for discussion and questions allowed. The patient understood, was satisfied, and agreed to proceed.     HPI: [name] is a 38 y.o. female who presents for EGD for evaluation of GERD, epigastric ab pain, N&V, s/p gastric sleeve.  Patient was most recently seen in the Gastroenterology Clinic on 10/27/23.  No interval change in medical history since that appointment. Please refer to that note for full details regarding GI history and clinical presentation.   Past Medical History:  Diagnosis Date   Allergy    Diabetes mellitus without complication (HCC)    before gastric sleeve procedure   GERD (gastroesophageal reflux disease)    Headache    when BP is up   Hypertension    Wears contact lenses     Past Surgical History:  Procedure Laterality Date   CESAREAN SECTION  04/30/2009   decreased fetal heart tones due to umbilical cord wrapped   CHOLECYSTECTOMY     ESOPHAGOGASTRODUODENOSCOPY (EGD) WITH PROPOFOL  N/A 10/24/2015   Procedure: ESOPHAGOGASTRODUODENOSCOPY (EGD) WITH PROPOFOL ;  Surgeon: Marnee Sink, MD;  Location: [facility];  Service: Endoscopy;  Laterality: N/A;   LAPAROSCOPIC GASTRIC SLEEVE RESECTION N/A 03/19/2015   Procedure: LAPAROSCOPIC GASTRIC SLEEVE RESECTION;  Surgeon: Robby Chime, MD;  Location: [facility];  Service: General;  Laterality: N/A;    Prior to Admission medications   Medication Sig Start Date End Date Taking? Authorizing Provider  etonogestrel  (NEXPLANON ) 68 MG IMPL implant 1 each by Subdermal route once. 12/23/21   [provider]  linaclotide  (LINZESS ) 145 MCG CAPS capsule Take 1 capsule (145 mcg total) by mouth daily before breakfast. 10/27/23 10/21/24  Edmonia Gottron, PA-C  omeprazole  (PRILOSEC) 40 MG capsule Take 1 capsule (40 mg total) by mouth 2 (two) times daily before a meal. 10/27/23   Edmonia Gottron, PA-C  potassium chloride  SA (KLOR-CON  M) 20 MEQ tablet Take 1 tablet (20 mEq total) by mouth 2 (two) times daily for 3 days. 10/24/23 10/27/23  Claria Crofts, MD  prochlorperazine  (COMPAZINE ) 10 MG tablet Take 1 tablet (10 mg total) by mouth every 6 (six) hours as needed for nausea or vomiting. 10/24/23   Claria Crofts, MD  rosuvastatin  (CRESTOR ) 5 MG tablet Take 1 tablet (5 mg total) by mouth daily. 09/15/23   Bedsole, Amy E, MD  Semaglutide ,0.25 or 0.5MG /DOS, (OZEMPIC , 0.25 OR 0.5 MG/DOSE,) 2 MG/3ML SOPN Inject 0.5 mg into the skin once a week. 09/15/23   Bedsole, Amy E, MD  sucralfate  (CARAFATE ) 1 GM/10ML suspension Take 10 mLs (1 g total) by mouth 4 (four) times daily. 10/20/23 10/19/24  Buell Carmin, MD    Current Outpatient Medications  Medication Sig Dispense Refill   etonogestrel  (NEXPLANON ) 68 MG IMPL implant 1 each by Subdermal route once.     linaclotide  (LINZESS ) 145 MCG CAPS capsule Take 1 capsule (145 mcg total) by mouth daily before breakfast. 90 capsule 3   omeprazole  (PRILOSEC) 40 MG capsule Take 1 capsule (40 mg total) by mouth 2 (two) times daily before a meal. 180 capsule 0   potassium chloride   SA (KLOR-CON  M) 20 MEQ tablet Take 1 tablet (20 mEq total) by mouth 2 (two) times daily for 3 days. 6 tablet 0   prochlorperazine  (COMPAZINE ) 10 MG tablet Take 1 tablet (10 mg total) by mouth every 6 (six) hours as needed for nausea or vomiting. 20 tablet 0   rosuvastatin  (CRESTOR ) 5 MG tablet Take 1 tablet (5 mg total) by mouth daily. 90 tablet 3   Semaglutide ,0.25 or 0.5MG /DOS, (OZEMPIC , 0.25 OR 0.5 MG/DOSE,) 2 MG/3ML SOPN Inject 0.5 mg into the skin once a week. 3 mL 11   sucralfate  (CARAFATE ) 1 GM/10ML  suspension Take 10 mLs (1 g total) by mouth 4 (four) times daily. 420 mL 1   No current facility-administered medications for this visit.    Allergies as of 11/15/2023 - Review Complete 11/15/2023  Allergen Reaction Noted   Tomato Rash 03/13/2015    Family History  Problem Relation Age of Onset   Diabetes Father    Hypertension Father    Hyperlipidemia Father    Hypertension Sister    Birth defects Sister        fluid on brain cause blindness in B eye   Breast cancer Maternal Aunt 30       has contact   Breast cancer Maternal Aunt 30       has contact   Prostate cancer Paternal Grandfather    Breast cancer Cousin 30       has contact   Colon cancer Neg Hx    Stomach cancer Neg Hx    Esophageal cancer Neg Hx    Rectal cancer Neg Hx     Social History   Socioeconomic History   Marital status: Single    Spouse name: Not on file   Number of children: 1   Years of education: Not on file   Highest education level: Associate degree: academic program  Occupational History   Occupation: Interior and spatial designer  Tobacco Use   Smoking status: Never   Smokeless tobacco: Never  Vaping Use   Vaping status: Former  Substance and Sexual Activity   Alcohol use: Yes    Comment: 1 drink/mo   Drug use: No   Sexual activity: Yes    Partners: Male    Birth control/protection: Implant    Comment: Nutraplanada  Other Topics Concern   Not on file  Social History Narrative   Single, on child age 30.   Social Drivers of Corporate investment banker Strain: Low Risk  (05/31/2023)   Overall Financial Resource Strain (CARDIA)    Difficulty of Paying Living Expenses: Not hard at all  Food Insecurity: No Food Insecurity (05/31/2023)   Hunger Vital Sign    Worried About Running Out of Food in the Last Year: Never true    Ran Out of Food in the Last Year: Never true  Transportation Needs: No Transportation Needs (05/31/2023)   PRAPARE - Administrator, Civil Service (Medical): No     Lack of Transportation (Non-Medical): No  Physical Activity: Insufficiently Active (05/31/2023)   Exercise Vital Sign    Days of Exercise per Week: 2 days    Minutes of Exercise per Session: 60 min  Stress: No Stress Concern Present (05/31/2023)   [employer] of Occupational Health - Occupational Stress Questionnaire    Feeling of Stress : Only a little  Social Connections: Moderately Isolated (05/31/2023)   Social Connection and Isolation Panel [NHANES]    Frequency of Communication with Friends and Family: More  than three times a week    Frequency of Social Gatherings with Friends and Family: More than three times a week    Attends Religious Services: 1 to 4 times per year    Active Member of [employer] or Organizations: No    Attends Engineer, structural: Not on file    Marital Status: Never married  Intimate Partner Violence: Not on file    Physical Exam: Vital signs in last 24 hours: BP (!) 160/104   Pulse 83   Temp 97.9 F (36.6 C)   Ht 5\' 6"  (1.676 m)   Wt 202 lb (91.6 kg)   LMP 10/20/2023 (Approximate)   SpO2 100%   BMI 32.60 kg/m  GEN: NAD EYE: Sclerae anicteric ENT: MMM CV: Non-tachycardic Pulm: No increased WOB GI: Soft NEURO:  Alert & Oriented   Regino Caprio, MD [facility]   11/15/2023 3:49 PM

## 2023-11-15 NOTE — Progress Notes (Signed)
 Sedate, gd SR, tolerated procedure well, VSS, report to RN

## 2023-11-16 ENCOUNTER — Ambulatory Visit (INDEPENDENT_AMBULATORY_CARE_PROVIDER_SITE_OTHER)

## 2023-11-16 ENCOUNTER — Other Ambulatory Visit (HOSPITAL_COMMUNITY): Payer: Self-pay

## 2023-11-16 ENCOUNTER — Ambulatory Visit

## 2023-11-16 DIAGNOSIS — E538 Deficiency of other specified B group vitamins: Secondary | ICD-10-CM

## 2023-11-16 MED ORDER — CYANOCOBALAMIN 1000 MCG/ML IJ SOLN
1000.0000 ug | Freq: Once | INTRAMUSCULAR | Status: AC
  Administered 2023-11-16: 1000 ug via INTRAMUSCULAR

## 2023-11-16 NOTE — Progress Notes (Signed)
 Per orders of Dr. Herby Lolling, injection of vitamin b 12 given by Claretha Crocker in right deltoid. Patient tolerated injection well. Patient will make appointment for 2 week.this was pts first injection of vit b 12 and pt waited 15 mins without problem or complaint.

## 2023-11-16 NOTE — Telephone Encounter (Signed)
 Unable to reach pt.  Left HIPAA compliant voicemail.

## 2023-11-16 NOTE — Telephone Encounter (Signed)
 Pharmacy Patient Advocate Encounter  Received notification from [facility] that Prior Authorization for Ozempic  (0.25 or 0.5 MG/DOSE) 2MG /3ML pen-injectors  has been APPROVED from 11/03/2023 to 11/02/2024   PA #/Case ID/Reference #: [id number]

## 2023-11-17 ENCOUNTER — Encounter: Payer: Self-pay | Admitting: Internal Medicine

## 2023-11-19 ENCOUNTER — Ambulatory Visit: Payer: Self-pay | Admitting: Internal Medicine

## 2023-11-19 LAB — SURGICAL PATHOLOGY

## 2023-11-30 ENCOUNTER — Ambulatory Visit (INDEPENDENT_AMBULATORY_CARE_PROVIDER_SITE_OTHER)

## 2023-11-30 ENCOUNTER — Encounter: Payer: Self-pay | Admitting: Family Medicine

## 2023-11-30 DIAGNOSIS — E538 Deficiency of other specified B group vitamins: Secondary | ICD-10-CM

## 2023-11-30 MED ORDER — CYANOCOBALAMIN 1000 MCG/ML IJ SOLN
1000.0000 ug | Freq: Once | INTRAMUSCULAR | Status: AC
  Administered 2023-11-30: 1000 ug via INTRAMUSCULAR

## 2023-11-30 NOTE — Progress Notes (Signed)
 Per orders of Dr. Cherlyn Cornet, injection of #2 of 3 every 2 week injection of B12 1000 mg/ml given by Franne Ivory, CMA in Left Deltoid. Patient tolerated injection well.  Sending to Aneta Bar, NP in Dr Millie Alm absence.

## 2023-12-14 ENCOUNTER — Ambulatory Visit

## 2023-12-15 ENCOUNTER — Encounter: Payer: Self-pay | Admitting: Family Medicine

## 2023-12-15 ENCOUNTER — Ambulatory Visit: Admitting: Family Medicine

## 2023-12-15 VITALS — BP 138/76 | HR 90 | Temp 98.5°F | Ht 66.5 in | Wt 214.0 lb

## 2023-12-15 DIAGNOSIS — E538 Deficiency of other specified B group vitamins: Secondary | ICD-10-CM

## 2023-12-15 DIAGNOSIS — E119 Type 2 diabetes mellitus without complications: Secondary | ICD-10-CM

## 2023-12-15 LAB — POCT GLYCOSYLATED HEMOGLOBIN (HGB A1C): Hemoglobin A1C: 6.1 % — AB (ref 4.0–5.6)

## 2023-12-15 MED ORDER — CYANOCOBALAMIN 1000 MCG/ML IJ SOLN
1000.0000 ug | Freq: Once | INTRAMUSCULAR | Status: AC
  Administered 2023-12-15: 1000 ug via INTRAMUSCULAR

## 2023-12-15 NOTE — Patient Instructions (Addendum)
 Can add pepcid AC twice daily in additional to omeprazole .  Hold semaglutide  if stomach upset.  Once done B12 injections... start oral B12 1000 mcg daily longterm.

## 2023-12-15 NOTE — Progress Notes (Signed)
 Per orders of Dr. Greig Ring, injection of B-12 given by Davene LITTIE Pouch in right deltoid. Patient tolerated injection well.

## 2023-12-15 NOTE — Assessment & Plan Note (Addendum)
 Chronic, well-controlled on semaglutide  0.5 mg weekly.  She does have reflux side effects.  We discussed stopping Ozempic  but she does not want to do this.  She can add Pepcid AC twice daily to her omeprazole  40 mg twice daily.  She will follow-up with GI if her symptoms or not improving.

## 2023-12-15 NOTE — Progress Notes (Signed)
 Patient ID: [name], female    DOB: [birth year], 38 y.o.   MRN: [id number]  This visit was conducted in person.  BP 138/76   Pulse 90   Temp 98.5 F (36.9 C) (Oral)   Ht 5' 6.5 (1.689 m)   Wt 214 lb (97.1 kg)   SpO2 100%   BMI 34.02 kg/m    CC:  Chief Complaint  Patient presents with   Follow-up    Diabetes     Subjective:   HPI: [name] is a 38 y.o. female presenting on 12/15/2023 for Follow-up (Diabetes )  Diabetes:   On semaglutide  0.5 mg weekly  ( had some epigastric pain see last PV note... but no further issues GI okay'd restart) Lab Results  Component Value Date   HGBA1C 6.1 (A) 12/15/2023  Using medications without difficulties: none Hypoglycemic episodes: Hyperglycemic episodes: Feet problems: none Blood Sugars averaging: none eye exam within last year: Wt Readings from Last 3 Encounters:  12/15/23 214 lb (97.1 kg)  11/15/23 202 lb (91.6 kg)  10/27/23 202 lb 12.8 oz (92 kg)  Body mass index is 34.02 kg/m.  Diet:  She has been working on healthy low carb diet, meal prep-ing. Has been working aggressively ion since July.  Working with a Research scientist (life sciences).  She has been working on portion control. Exercise: regular exercise.. treadmill etc. 3 times a week 45 min.    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   GERD: on omeprazole  40 mg BID.  Relevant past medical, surgical, family and social history reviewed and updated as indicated. Interim medical history since our last visit reviewed. Allergies and medications reviewed and updated. Outpatient Medications Prior to Visit  Medication Sig Dispense Refill   etonogestrel  (NEXPLANON ) 68 MG IMPL implant 1 each by Subdermal route once.     linaclotide  (LINZESS ) 145 MCG CAPS capsule Take 1 capsule (145 mcg total) by mouth daily before breakfast. 90 capsule 3   omeprazole  (PRILOSEC) 40 MG capsule Take 1 capsule (40 mg total) by mouth 2 (two) times daily before a meal. 180 capsule 0    rosuvastatin  (CRESTOR ) 5 MG tablet Take 1 tablet (5 mg total) by mouth daily. 90 tablet 3   Semaglutide ,0.25 or 0.5MG /DOS, (OZEMPIC , 0.25 OR 0.5 MG/DOSE,) 2 MG/3ML SOPN Inject 0.5 mg into the skin once a week. 3 mL 11   potassium chloride  SA (KLOR-CON  M) 20 MEQ tablet Take 1 tablet (20 mEq total) by mouth 2 (two) times daily for 3 days. (Patient not taking: Reported on 12/15/2023) 6 tablet 0   prochlorperazine  (COMPAZINE ) 10 MG tablet Take 1 tablet (10 mg total) by mouth every 6 (six) hours as needed for nausea or vomiting. (Patient not taking: Reported on 12/15/2023) 20 tablet 0   sucralfate  (CARAFATE ) 1 GM/10ML suspension Take 10 mLs (1 g total) by mouth 4 (four) times daily. (Patient not taking: Reported on 12/15/2023) 420 mL 1   No facility-administered medications prior to visit.     Per HPI unless specifically indicated in ROS section below Review of Systems  Constitutional:  Negative for fatigue and fever.  HENT:  Negative for congestion.   Eyes:  Negative for pain.  Respiratory:  Negative for cough and shortness of breath.   Cardiovascular:  Negative for chest pain, palpitations and leg swelling.  Gastrointestinal:  Negative for abdominal pain.  Genitourinary:  Negative for dysuria and vaginal bleeding.  Musculoskeletal:  Negative for back pain.  Neurological:  Negative for syncope,  light-headedness and headaches.  Psychiatric/Behavioral:  Negative for dysphoric mood.    Objective:  BP 138/76   Pulse 90   Temp 98.5 F (36.9 C) (Oral)   Ht 5' 6.5 (1.689 m)   Wt 214 lb (97.1 kg)   SpO2 100%   BMI 34.02 kg/m   Wt Readings from Last 3 Encounters:  12/15/23 214 lb (97.1 kg)  11/15/23 202 lb (91.6 kg)  10/27/23 202 lb 12.8 oz (92 kg)      Physical Exam Vitals and nursing note reviewed.  Constitutional:      General: She is not in acute distress.    Appearance: Normal appearance. She is well-developed. She is obese. She is not ill-appearing or toxic-appearing.  HENT:     Head:  Normocephalic.     Right Ear: Hearing, tympanic membrane, ear canal and external ear normal.     Left Ear: Hearing, tympanic membrane, ear canal and external ear normal.     Nose: Nose normal.  Eyes:     General: Lids are normal. Lids are everted, no foreign bodies appreciated.     Conjunctiva/sclera: Conjunctivae normal.     Pupils: Pupils are equal, round, and reactive to light.  Neck:     Thyroid : No thyroid  mass or thyromegaly.     Vascular: No carotid bruit.     Trachea: Trachea normal.  Cardiovascular:     Rate and Rhythm: Normal rate and regular rhythm.     Heart sounds: Normal heart sounds, S1 normal and S2 normal. No murmur heard.    No gallop.  Pulmonary:     Effort: Pulmonary effort is normal. No respiratory distress.     Breath sounds: Normal breath sounds. No wheezing, rhonchi or rales.  Abdominal:     General: Bowel sounds are normal. There is no distension or abdominal bruit.     Palpations: Abdomen is soft. There is no fluid wave or mass.     Tenderness: There is no abdominal tenderness. There is no guarding or rebound.     Hernia: No hernia is present.  Musculoskeletal:     Cervical back: Normal range of motion and neck supple.  Lymphadenopathy:     Cervical: No cervical adenopathy.  Skin:    General: Skin is warm and dry.     Findings: No rash.  Neurological:     Mental Status: She is alert.     Cranial Nerves: No cranial nerve deficit.     Sensory: No sensory deficit.  Psychiatric:        Mood and Affect: Mood is not anxious or depressed.        Speech: Speech normal.        Behavior: Behavior normal. Behavior is cooperative.        Judgment: Judgment normal.         Results for orders placed or performed in visit on 12/15/23  POCT glycosylated hemoglobin (Hb A1C)   Collection Time: 12/15/23  8:53 AM  Result Value Ref Range   Hemoglobin A1C 6.1 (A) 4.0 - 5.6 %   HbA1c POC (<> result, manual entry)     HbA1c, POC (prediabetic range)     HbA1c, POC  (controlled diabetic range)       COVID 19 screen:  No recent travel or known exposure to COVID19 The patient denies respiratory symptoms of COVID 19 at this time. The importance of social distancing was discussed today.   Assessment and Plan The patient's preventative maintenance and recommended  screening tests for an annual wellness exam were reviewed in full today. Brought up to date unless services declined.  Counselled on the importance of diet, exercise, and its role in overall health and mortality. The patient's FH and SH was reviewed, including their home life, tobacco status, and drug and alcohol status.   Vaccines:Consider PNA 23 given DM, ( not interested given now prediabetes range) COVID x 3, flu due ( refused),  Given Tdap today Pap/DVE:  05/2023. followed by VALJEAN Kennard SHIPPER. Mammo:  not indicated, no early family history ( MGM with breast cancer).. did have 03/2020 Colon:  not indicated, no early  first degree family history. Smoking Status:none ETOH/ drug use: rare/ none  Hep C: neg  HIV screen:  done    Problem List Items Addressed This Visit     B12 deficiency   Form completed at office visit today to have ADA accommodation for B12 injections for the next 8 weeks followed by once monthly.  She will transition over to B12 1 g orally daily. We will recheck B12 in November and if she is not in the normal range we may have to set her up with home monthly B12 injections given gastric bypass history.      Type II diabetes mellitus, well controlled (HCC) - Primary   Chronic, well-controlled on semaglutide  0.5 mg weekly.  She does have reflux side effects.  We discussed stopping Ozempic  but she does not want to do this.  She can add Pepcid AC twice daily to her omeprazole  40 mg twice daily.  She will follow-up with GI if her symptoms or not improving.      Relevant Orders   POCT glycosylated hemoglobin (Hb A1C) (Completed)   Other Visit Diagnoses       Vitamin  B 12 deficiency       Relevant Medications   cyanocobalamin  (VITAMIN B12) injection 1,000 mcg (Completed)        Greig Ring, MD

## 2023-12-15 NOTE — Assessment & Plan Note (Signed)
 Form completed at office visit today to have ADA accommodation for B12 injections for the next 8 weeks followed by once monthly.  She will transition over to B12 1 g orally daily. We will recheck B12 in November and if she is not in the normal range we may have to set her up with home monthly B12 injections given gastric bypass history.

## 2023-12-28 ENCOUNTER — Ambulatory Visit

## 2023-12-29 ENCOUNTER — Ambulatory Visit (INDEPENDENT_AMBULATORY_CARE_PROVIDER_SITE_OTHER)

## 2023-12-29 ENCOUNTER — Encounter: Payer: Self-pay | Admitting: Family Medicine

## 2023-12-29 DIAGNOSIS — E538 Deficiency of other specified B group vitamins: Secondary | ICD-10-CM | POA: Diagnosis not present

## 2023-12-29 MED ORDER — CYANOCOBALAMIN 1000 MCG/ML IJ SOLN
1000.0000 ug | Freq: Once | INTRAMUSCULAR | Status: AC
  Administered 2023-12-29: 1000 ug via INTRAMUSCULAR

## 2023-12-29 NOTE — Progress Notes (Signed)
Patient presented for B 12 injection given by Rhylan Kagel, CMA to left deltoid, patient voiced no concerns nor showed any signs of distress during injection.  

## 2024-01-11 ENCOUNTER — Ambulatory Visit

## 2024-01-12 ENCOUNTER — Ambulatory Visit (INDEPENDENT_AMBULATORY_CARE_PROVIDER_SITE_OTHER): Admitting: *Deleted

## 2024-01-12 ENCOUNTER — Ambulatory Visit

## 2024-01-12 DIAGNOSIS — E538 Deficiency of other specified B group vitamins: Secondary | ICD-10-CM | POA: Diagnosis not present

## 2024-01-12 MED ORDER — CYANOCOBALAMIN 1000 MCG/ML IJ SOLN
1000.0000 ug | Freq: Once | INTRAMUSCULAR | Status: AC
  Administered 2024-01-12: 1000 ug via INTRAMUSCULAR

## 2024-01-12 NOTE — Progress Notes (Signed)
 Per orders of Mallie Gaskins, DNP in the absence of Dr. Avelina, injection of Vitamin B12 given in Right Deltoid  by Wendell Arland Pesa. Patient tolerated injection well.  Patient will now go to monthly B12 injections.

## 2024-01-17 ENCOUNTER — Emergency Department
Admission: EM | Admit: 2024-01-17 | Discharge: 2024-01-18 | Disposition: A | Discharge status: Home / Self Care | Payer: Self-pay | Attending: Emergency Medicine | Admitting: Emergency Medicine

## 2024-01-17 ENCOUNTER — Encounter: Payer: Self-pay | Admitting: Emergency Medicine

## 2024-01-17 ENCOUNTER — Other Ambulatory Visit: Payer: Self-pay

## 2024-01-17 DIAGNOSIS — S39012A Strain of muscle, fascia and tendon of lower back, initial encounter: Secondary | ICD-10-CM | POA: Insufficient documentation

## 2024-01-17 DIAGNOSIS — T07XXXA Unspecified multiple injuries, initial encounter: Secondary | ICD-10-CM

## 2024-01-17 DIAGNOSIS — E119 Type 2 diabetes mellitus without complications: Secondary | ICD-10-CM | POA: Insufficient documentation

## 2024-01-17 DIAGNOSIS — S80212A Abrasion, left knee, initial encounter: Secondary | ICD-10-CM | POA: Insufficient documentation

## 2024-01-17 DIAGNOSIS — S7012XA Contusion of left thigh, initial encounter: Secondary | ICD-10-CM | POA: Insufficient documentation

## 2024-01-17 DIAGNOSIS — S8011XA Contusion of right lower leg, initial encounter: Secondary | ICD-10-CM | POA: Insufficient documentation

## 2024-01-17 DIAGNOSIS — S8000XA Contusion of unspecified knee, initial encounter: Secondary | ICD-10-CM | POA: Insufficient documentation

## 2024-01-17 DIAGNOSIS — S3992XA Unspecified injury of lower back, initial encounter: Secondary | ICD-10-CM | POA: Diagnosis present

## 2024-01-17 DIAGNOSIS — S50312A Abrasion of left elbow, initial encounter: Secondary | ICD-10-CM | POA: Diagnosis not present

## 2024-01-17 DIAGNOSIS — S9031XA Contusion of right foot, initial encounter: Secondary | ICD-10-CM | POA: Diagnosis not present

## 2024-01-17 MED ORDER — OXYCODONE-ACETAMINOPHEN 5-325 MG PO TABS
1.0000 | ORAL_TABLET | Freq: Once | ORAL | Status: AC
  Administered 2024-01-18: 1 via ORAL
  Filled 2024-01-17: qty 1
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
## 2024-01-18 ENCOUNTER — Emergency Department: Payer: Self-pay

## 2024-01-18 LAB — POC URINE PREG, ED: Preg Test, Ur: NEGATIVE

## 2024-01-18 MED ORDER — TRAMADOL HCL 50 MG PO TABS
50.0000 mg | ORAL_TABLET | Freq: Four times a day (QID) | ORAL | 0 refills | Status: AC | PRN
Start: 2024-01-18 — End: 2024-01-23

## 2024-01-18 MED ORDER — BACITRACIN-NEOMYCIN-POLYMYXIN OINTMENT TUBE
TOPICAL_OINTMENT | Freq: Every day | CUTANEOUS | Status: DC
  Filled 2024-01-18: qty 14.17

## 2024-01-18 NOTE — ED Notes (Signed)
 Patient's wounds had bacitracin  applied, non adherent dressing applied, and wrapped to keep clean. Patient given discharge instructions including prescriptions x1 and importance of pain management at home. Patient stable and ambulatory with steady even gait on dispo.

## 2024-01-18 NOTE — Discharge Instructions (Addendum)
 Keep the wounds clean with soap and water and apply bacitracin  or Neosporin ointment once or twice daily.  You may take the pain medication as prescribed.  Follow-up with your primary care provider as needed.

## 2024-01-21 ENCOUNTER — Ambulatory Visit: Admitting: Internal Medicine

## 2024-01-26 ENCOUNTER — Ambulatory Visit

## 2024-02-09 ENCOUNTER — Ambulatory Visit (INDEPENDENT_AMBULATORY_CARE_PROVIDER_SITE_OTHER): Payer: Self-pay

## 2024-02-09 DIAGNOSIS — E538 Deficiency of other specified B group vitamins: Secondary | ICD-10-CM

## 2024-02-09 MED ORDER — CYANOCOBALAMIN 1000 MCG/ML IJ SOLN
1000.0000 ug | Freq: Once | INTRAMUSCULAR | Status: AC
  Administered 2024-02-09: 1000 ug via INTRAMUSCULAR

## 2024-02-09 NOTE — Progress Notes (Signed)
Per orders of Dr. Kerby Nora, injection of vitamin b 12 given by Lewanda Rife in left deltoid. Patient tolerated injection well. Patient will make appointment for 1 month.

## 2024-02-17 ENCOUNTER — Ambulatory Visit: Admitting: Physician Assistant

## 2024-04-05 ENCOUNTER — Telehealth: Payer: Self-pay

## 2024-04-05 NOTE — Telephone Encounter (Unsigned)
 Copied from CRM [id number]. Topic: General - Other >> Apr 05, 2024  1:28 PM Nessti S wrote: Reason for CRM: patient called back and stated the appt is for the B12 shot f/u

## 2024-04-05 NOTE — Telephone Encounter (Signed)
 Attempted to call patient.  She is scheduled to see Dr. Avelina tomorrow but the appointment note says follow up.  Called to get clarification on the reason for the appointment.

## 2024-04-06 ENCOUNTER — Other Ambulatory Visit: Payer: Self-pay | Admitting: Family Medicine

## 2024-04-06 ENCOUNTER — Encounter: Payer: Self-pay | Admitting: Family Medicine

## 2024-04-06 ENCOUNTER — Ambulatory Visit (INDEPENDENT_AMBULATORY_CARE_PROVIDER_SITE_OTHER): Payer: Self-pay | Admitting: Family Medicine

## 2024-04-06 VITALS — BP 132/80 | HR 102 | Temp 98.2°F | Ht 66.5 in | Wt 217.2 lb

## 2024-04-06 DIAGNOSIS — Z7985 Long-term (current) use of injectable non-insulin antidiabetic drugs: Secondary | ICD-10-CM

## 2024-04-06 DIAGNOSIS — E538 Deficiency of other specified B group vitamins: Secondary | ICD-10-CM

## 2024-04-06 DIAGNOSIS — E119 Type 2 diabetes mellitus without complications: Secondary | ICD-10-CM | POA: Diagnosis not present

## 2024-04-06 NOTE — Assessment & Plan Note (Signed)
 Chronic, due for reevaluation of A1c. She is unable to tolerate semaglutide .  She is willing to consider medication such as Mounjaro but we did discuss that this may have the same GI side effect profile. She will continue working on heart healthy low-carb diet and regular exercise.

## 2024-04-06 NOTE — Progress Notes (Signed)
 Patient ID: [name], female    DOB: [birth year], 38 y.o.   MRN: [id number]  This visit was conducted in person.  BP 132/80   Pulse (!) 102   Temp 98.2 F (36.8 C) (Temporal)   Ht 5' 6.5 (1.689 m)   Wt 217 lb 4 oz (98.5 kg)   SpO2 99%   BMI 34.54 kg/m    CC:  Chief Complaint  Patient presents with   B12 Defiency   Obesity   Diabetes    Subjective:   HPI: [name] is a 38 y.o. female presenting on 04/06/2024 for B12 Defiency, Obesity, and Diabetes    Previously on biweekly B12 for deficiency... due for re-eval.  Last dose 02/09/2024 Hx of gastric surgery  Diabetes:   No longer on semaglutide  given GI SE.  Lab Results  Component Value Date   HGBA1C 6.1 (A) 12/15/2023  Using medications without difficulties: none Hypoglycemic episodes: Hyperglycemic episodes: Feet problems: none Blood Sugars averaging: none eye exam within last year: Wt Readings from Last 3 Encounters:  04/06/24 217 lb 4 oz (98.5 kg)  01/17/24 203 lb (92.1 kg)  12/15/23 214 lb (97.1 kg)  Body mass index is 34.54 kg/m.  Diet:  She has been working on healthy low carb diet, meal prep-ing.  Working with a Research scientist (life sciences).  She has been working on portion control. Exercise: regular exercise.. treadmill etc. 3 times a week 45 min.    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   GERD: on omeprazole  40 mg BID.  Relevant past medical, surgical, family and social history reviewed and updated as indicated. Interim medical history since our last visit reviewed. Allergies and medications reviewed and updated. Outpatient Medications Prior to Visit  Medication Sig Dispense Refill   etonogestrel  (NEXPLANON ) 68 MG IMPL implant 1 each by Subdermal route once.     linaclotide  (LINZESS ) 145 MCG CAPS capsule Take 1 capsule (145 mcg total) by mouth daily before breakfast. 90 capsule 3   omeprazole  (PRILOSEC) 40 MG capsule Take 1 capsule (40 mg total) by mouth 2 (two) times daily before a  meal. 180 capsule 0   rosuvastatin  (CRESTOR ) 5 MG tablet Take 1 tablet (5 mg total) by mouth daily. 90 tablet 3   potassium chloride  SA (KLOR-CON  M) 20 MEQ tablet Take 1 tablet (20 mEq total) by mouth 2 (two) times daily for 3 days. (Patient not taking: Reported on 12/15/2023) 6 tablet 0   prochlorperazine  (COMPAZINE ) 10 MG tablet Take 1 tablet (10 mg total) by mouth every 6 (six) hours as needed for nausea or vomiting. (Patient not taking: Reported on 12/15/2023) 20 tablet 0   Semaglutide ,0.25 or 0.5MG /DOS, (OZEMPIC , 0.25 OR 0.5 MG/DOSE,) 2 MG/3ML SOPN Inject 0.5 mg into the skin once a week. 3 mL 11   sucralfate  (CARAFATE ) 1 GM/10ML suspension Take 10 mLs (1 g total) by mouth 4 (four) times daily. (Patient not taking: Reported on 12/15/2023) 420 mL 1   No facility-administered medications prior to visit.     Per HPI unless specifically indicated in ROS section below Review of Systems  Constitutional:  Negative for fatigue and fever.  HENT:  Negative for congestion.   Eyes:  Negative for pain.  Respiratory:  Negative for cough and shortness of breath.   Cardiovascular:  Negative for chest pain, palpitations and leg swelling.  Gastrointestinal:  Negative for abdominal pain.  Genitourinary:  Negative for dysuria and vaginal bleeding.  Musculoskeletal:  Negative for back pain.  Neurological:  Negative for syncope, light-headedness and headaches.  Psychiatric/Behavioral:  Negative for dysphoric mood.    Objective:  BP 132/80   Pulse (!) 102   Temp 98.2 F (36.8 C) (Temporal)   Ht 5' 6.5 (1.689 m)   Wt 217 lb 4 oz (98.5 kg)   SpO2 99%   BMI 34.54 kg/m   Wt Readings from Last 3 Encounters:  04/06/24 217 lb 4 oz (98.5 kg)  01/17/24 203 lb (92.1 kg)  12/15/23 214 lb (97.1 kg)      Physical Exam Vitals and nursing note reviewed.  Constitutional:      General: She is not in acute distress.    Appearance: Normal appearance. She is well-developed. She is obese. She is not ill-appearing or  toxic-appearing.  HENT:     Head: Normocephalic.     Right Ear: Hearing, tympanic membrane, ear canal and external ear normal.     Left Ear: Hearing, tympanic membrane, ear canal and external ear normal.     Nose: Nose normal.  Eyes:     General: Lids are normal. Lids are everted, no foreign bodies appreciated.     Conjunctiva/sclera: Conjunctivae normal.     Pupils: Pupils are equal, round, and reactive to light.  Neck:     Thyroid : No thyroid  mass or thyromegaly.     Vascular: No carotid bruit.     Trachea: Trachea normal.  Cardiovascular:     Rate and Rhythm: Normal rate and regular rhythm.     Heart sounds: Normal heart sounds, S1 normal and S2 normal. No murmur heard.    No gallop.  Pulmonary:     Effort: Pulmonary effort is normal. No respiratory distress.     Breath sounds: Normal breath sounds. No wheezing, rhonchi or rales.  Abdominal:     General: Bowel sounds are normal. There is no distension or abdominal bruit.     Palpations: Abdomen is soft. There is no fluid wave or mass.     Tenderness: There is no abdominal tenderness. There is no guarding or rebound.     Hernia: No hernia is present.  Musculoskeletal:     Cervical back: Normal range of motion and neck supple.  Lymphadenopathy:     Cervical: No cervical adenopathy.  Skin:    General: Skin is warm and dry.     Findings: No rash.  Neurological:     Mental Status: She is alert.     Cranial Nerves: No cranial nerve deficit.     Sensory: No sensory deficit.  Psychiatric:        Mood and Affect: Mood is not anxious or depressed.        Speech: Speech normal.        Behavior: Behavior normal. Behavior is cooperative.        Judgment: Judgment normal.         Results for orders placed or performed during the hospital encounter of 01/17/24  POC urine preg, ED   Collection Time: 01/18/24 12:26 AM  Result Value Ref Range   Preg Test, Ur Negative Negative     COVID 19 screen:  No recent travel or known  exposure to COVID19 The patient denies respiratory symptoms of COVID 19 at this time. The importance of social distancing was discussed today.   Assessment and Plan  Problem List Items Addressed This Visit     B12 deficiency   Chronic, due to past gastric surgery Status post 8 weeks of biweekly B12  injections.  No injections since August. Will recheck B12 level today to determine if we need to start monthly injections or if we can move to B12 1 g orally daily.  Patient reports that she feels much better with great energy.      Relevant Orders   Vitamin B12   Type II diabetes mellitus, well controlled (HCC) - Primary   Chronic, due for reevaluation of A1c. She is unable to tolerate semaglutide .  She is willing to consider medication such as Mounjaro but we did discuss that this may have the same GI side effect profile. She will continue working on heart healthy low-carb diet and regular exercise.      Relevant Orders   Hemoglobin A1c     Cassidy Ring, MD

## 2024-04-06 NOTE — Assessment & Plan Note (Signed)
 Chronic, due to past gastric surgery Status post 8 weeks of biweekly B12 injections.  No injections since August. Will recheck B12 level today to determine if we need to start monthly injections or if we can move to B12 1 g orally daily.  Patient reports that she feels much better with great energy.

## 2024-04-07 ENCOUNTER — Ambulatory Visit: Payer: Self-pay | Admitting: Family Medicine

## 2024-04-07 ENCOUNTER — Other Ambulatory Visit: Payer: Self-pay | Admitting: Family Medicine

## 2024-04-07 LAB — HEMOGLOBIN A1C: Hgb A1c MFr Bld: 6.6 % — ABNORMAL HIGH (ref 4.6–6.5)

## 2024-04-07 LAB — VITAMIN B12: Vitamin B-12: 194 pg/mL — ABNORMAL LOW (ref 211–911)

## 2024-04-07 MED ORDER — CYANOCOBALAMIN 1000 MCG/ML IJ SOLN: 1000.0000 ug | INTRAMUSCULAR | 11 refills | Status: AC

## 2024-04-11 ENCOUNTER — Telehealth: Payer: Self-pay | Admitting: *Deleted

## 2024-04-11 NOTE — Telephone Encounter (Signed)
-----   Message from Veva JINNY Ferrari sent at 04/10/2024  2:43 PM EDT ----- Regarding: Lab orders for Mon, 11,3.25 Lab orders for B12? Do you want the TBGold?

## 2024-04-11 NOTE — Addendum Note (Signed)
 Addended by: WENDELL ARLAND RAMAN on: 04/11/2024 03:20 PM   Modules accepted: Orders

## 2024-04-11 NOTE — Telephone Encounter (Signed)
 Patient on lab scheduled 04/17/24 for B12 level.   Just had B12 done on 04/06/24.. Does she need to keep this appointment.

## 2024-04-14 MED ORDER — OZEMPIC (0.25 OR 0.5 MG/DOSE) 2 MG/3ML ~~LOC~~ SOPN: 2.0000 mg | PEN_INJECTOR | SUBCUTANEOUS | 3 refills | Status: DC

## 2024-04-17 ENCOUNTER — Other Ambulatory Visit

## 2024-04-24 ENCOUNTER — Telehealth: Payer: Self-pay | Admitting: Family Medicine

## 2024-04-24 ENCOUNTER — Other Ambulatory Visit (HOSPITAL_COMMUNITY): Payer: Self-pay

## 2024-04-24 ENCOUNTER — Telehealth: Payer: Self-pay

## 2024-04-24 DIAGNOSIS — E119 Type 2 diabetes mellitus without complications: Secondary | ICD-10-CM

## 2024-04-24 DIAGNOSIS — E538 Deficiency of other specified B group vitamins: Secondary | ICD-10-CM

## 2024-04-24 MED ORDER — SYRINGE 25G X 1" 3 ML MISC: 0 refills | Status: AC

## 2024-04-24 MED ORDER — SEMAGLUTIDE-WEIGHT MANAGEMENT 0.25 MG/0.5ML ~~LOC~~ SOAJ: 0.2500 mg | SUBCUTANEOUS | 0 refills | Status: DC

## 2024-04-24 MED ORDER — OZEMPIC (0.25 OR 0.5 MG/DOSE) 2 MG/3ML ~~LOC~~ SOPN: 0.2500 mg | PEN_INJECTOR | SUBCUTANEOUS | 0 refills | Status: AC

## 2024-04-24 NOTE — Telephone Encounter (Signed)
 Copied from CRM [id number]. Topic: Clinical - Prescription Issue >> Apr 24, 2024  2:04 PM Winona R wrote: Pt states the pharmacy need clarification on rx dosage for Semaglutide ,0.25 or 0.5MG /DOS, (OZEMPIC , 0.25 OR 0.5 MG/DOSE,) 2 MG/3ML SOPN as the inject is measured by ML not MG,

## 2024-04-24 NOTE — Telephone Encounter (Signed)
 Sent in Rx for Ozempic  0.25/0.5 mg with diabetes diagnosis.

## 2024-04-24 NOTE — Telephone Encounter (Signed)
 Pharmacy Patient Advocate Encounter   Received notification from Onbase that prior authorization for Wegovy 0.25MG /0.5ML auto-injectors received a PA for [facility].  is required/requested. Insurance verification completed.   The patient is insured through [facility].  Per chart notes, patient is unable to tolerate semaglutide  and a new order was written for [facility] which is semaglutide . Please advise.     PLEASE BE ADVISE would you if  the provider WOULD LIKE TO SUBMIT FOR OZEMPIC  DUE TO THE PATIENT HAVING DIABETES.  Will not process PA with out your assistance please let me know if you would like to send new script for diabetic use before moving forward with PA.

## 2024-04-24 NOTE — Telephone Encounter (Signed)
 Pt states she picked up the B12 prescription but it dd not have the needles with it and the pharmacy states they did not understand the dosage for the Ozempic  RX. Please call pt with details.    Copied from CRM [id number]. Topic: Appointments - Scheduling Inquiry for Clinic >> Apr 24, 2024  2:02 PM [name] wrote: Pt would like to schedule a lab appointment for the lab she missed for B12 no B12 orders in chart only cbc

## 2024-04-24 NOTE — Telephone Encounter (Signed)
 Spoke with Kamelia. She states pharmacy is questioning the semaglutide  Rx that was sent in.  It looks like it was written for 2 mg instead of 0.25 mg weekly. She also needs syringes and needles to be able to give herself the B12 shots.  Corrected Rx for semagludtide 0.25 mg weekly sent to [facility] as well as Syringes and needles to administer B12 with monthly.  [facility] on [address].

## 2024-04-25 ENCOUNTER — Other Ambulatory Visit (HOSPITAL_COMMUNITY): Payer: Self-pay

## 2024-04-25 ENCOUNTER — Telehealth: Payer: Self-pay

## 2024-04-25 NOTE — Telephone Encounter (Signed)
 Pharmacy Patient Advocate Encounter   Received notification from Onbase that prior authorization for OZEMPIC , 0.25 OR 0.5 MG/DOSE, 2 MG/3ML SOPN   is required/requested.   Insurance verification completed.   The patient is insured through [employer] ([id number]) .   Per test claim: NO PA IS NEEDED AT THIS TIME TEST BILL COPAY $4.00 PA is not needed at this time. PT CAN FILL AT PHARMACY

## 2024-04-27 ENCOUNTER — Other Ambulatory Visit (HOSPITAL_COMMUNITY): Payer: Self-pay

## 2024-05-01 ENCOUNTER — Other Ambulatory Visit (HOSPITAL_COMMUNITY): Payer: Self-pay

## 2024-05-01 NOTE — Telephone Encounter (Signed)
 Patient states she can't tolerate Ozempic  and would like [facility] sent in . Per pharmacy a prior authorization is needed. Patient is needing this for diabetes not weigh loss. Please a contact patient patient with an update on this request.    [phone]

## 2024-05-01 NOTE — Telephone Encounter (Signed)
 Spoke with [name] and let her know that Ozermpic and Georjean are the same medication.  Ozempic  is prescribed for diabetes and Wegovy is prescribed for weight loss.  Ozempic  was sent to pharmacy on 04/24/2024 and per our referral team, a PA was not required.  See note below from Nat Rigg, CPhT.   [facility] is saying patient needs a PA.   So patient has not been able to get the Ozempic .  I faxed message to [facility] stating PA is not needed with Dx codes:  E11.9 nd Ell.69/E78.5.  I did go ahead and fax PA form that [facility] sent over to see if this makes a difference so patient can get her medication.  FYI to Dr. Avelina.

## 2024-05-01 NOTE — Telephone Encounter (Signed)
 PA approved.  Patient notified of this via telephone.

## 2024-05-17 ENCOUNTER — Other Ambulatory Visit: Payer: Self-pay | Admitting: Physician Assistant

## 2024-06-23 ENCOUNTER — Ambulatory Visit (INDEPENDENT_AMBULATORY_CARE_PROVIDER_SITE_OTHER): Admitting: Obstetrics

## 2024-06-23 ENCOUNTER — Encounter: Payer: Self-pay | Admitting: Obstetrics

## 2024-06-23 VITALS — BP 128/84 | HR 118 | Ht 66.0 in | Wt 206.0 lb

## 2024-06-23 DIAGNOSIS — Z01419 Encounter for gynecological examination (general) (routine) without abnormal findings: Secondary | ICD-10-CM | POA: Diagnosis not present

## 2024-06-23 DIAGNOSIS — Z113 Encounter for screening for infections with a predominantly sexual mode of transmission: Secondary | ICD-10-CM

## 2024-06-23 DIAGNOSIS — F410 Panic disorder [episodic paroxysmal anxiety] without agoraphobia: Secondary | ICD-10-CM

## 2024-06-23 NOTE — Progress Notes (Signed)
 "  ANNUAL GYNECOLOGICAL EXAM  SUBJECTIVE  HPI  [name] is a 39 y.o.-year-old G2P1011 who presents for an annual gynecological exam today.  She denies pelvic pain, dyspareunia, abnormal vaginal bleeding or discharge, and UTI symptoms. She does report an odor after intercourse that resolves with boric acid. She has a Nexplanon  for contraception and is happy with this method. She plans on having it replaced when it expires in July. She reports some concerns about panic attacks since the passing of her grandmother and would like to speak to a counselor.  Medical/Surgical History Past Medical History:  Diagnosis Date   Allergy    Diabetes mellitus without complication (HCC)    before gastric sleeve procedure   GERD (gastroesophageal reflux disease)    Headache    when BP is up   Hypertension    Wears contact lenses    Past Surgical History:  Procedure Laterality Date   CESAREAN SECTION  04/30/2009   decreased fetal heart tones due to umbilical cord wrapped   CHOLECYSTECTOMY     ESOPHAGOGASTRODUODENOSCOPY (EGD) WITH PROPOFOL  N/A 10/24/2015   Procedure: ESOPHAGOGASTRODUODENOSCOPY (EGD) WITH PROPOFOL ;  Surgeon: Rogelia Copping, MD;  Location: [facility];  Service: Endoscopy;  Laterality: N/A;   LAPAROSCOPIC GASTRIC SLEEVE RESECTION N/A 03/19/2015   Procedure: LAPAROSCOPIC GASTRIC SLEEVE RESECTION;  Surgeon: Ozell DELENA Mano, MD;  Location: [facility];  Service: General;  Laterality: N/A;    Social History Lives with son . Feels safe there Work:[employer]  Exercise: yes  Substances:  no EtOH, tobacco, vape, and recreational drugs  Obstetric History OB History     Gravida  2   Para  1   Term  1   Preterm      AB  1   Living  1      SAB  1   IAB      Ectopic      Multiple      Live Births  1            GYN/Menstrual History Patient's last menstrual period was 05/29/2024. Irregular periods  Last Pap: 05/24/23 Contraception: Nexplanon     Prevention Dentist utd Eye examutd Mammogram n/a Colonoscopy n/a Flu shot/vaccines utd  Current Medications Show/hide medication list[1]      ROS Constitutional: Denied constitutional symptoms, night sweats, recent illness, fatigue, fever, insomnia and weight loss.  Eyes: Denied eye symptoms, eye pain, photophobia, vision change and visual disturbance.  Ears/Nose/Throat/Neck: Denied ear, nose, throat or neck symptoms, hearing loss, nasal discharge, sinus congestion and sore throat.  Cardiovascular: Denied cardiovascular symptoms, arrhythmia, chest pain/pressure, edema, exercise intolerance, orthopnea and palpitations.  Respiratory: Denied pulmonary symptoms, asthma, pleuritic pain, productive sputum, cough, dyspnea and wheezing.  Gastrointestinal: Denied gastro-esophageal reflux, melena, nausea and vomiting.  Genitourinary: Denied genitourinary symptoms including symptomatic vaginal discharge, pelvic relaxation issues, and urinary complaints.  Musculoskeletal: Denied musculoskeletal symptoms, stiffness, swelling, muscle weakness and myalgia.  Dermatologic: Denied dermatology symptoms, rash and scar.  Neurologic: Denied neurology symptoms, dizziness, headache, neck pain and syncope.  Psychiatric: +anxiety attacks, grief  Endocrine: Denied endocrine symptoms including hot flashes and night sweats.    OBJECTIVE  BP 128/84   Pulse (!) 118   Ht 5' 6 (1.676 m)   Wt 206 lb (93.4 kg)   LMP 05/29/2024   BMI 33.25 kg/m    Physical examination General NAD, Conversant  HEENT Atraumatic; Op clear with mmm.  Normo-cephalic. Pupils reactive. Anicteric sclerae  Thyroid /Neck Smooth without nodularity or enlargement. Normal  ROM.  Neck Supple.  Skin No rashes, lesions or ulceration. Normal palpated skin turgor. No nodularity.  Breasts: No masses or discharge.  Symmetric.  No axillary adenopathy.  Lungs: Clear to auscultation.No rales or wheezes. Normal Respiratory effort, no  retractions.  Heart: NSR.  No murmurs or rubs appreciated. No peripheral edema  Abdomen: Soft.  Non-tender.  No masses.  No HSM. No hernia  Extremities: Moves all appropriately.  Normal ROM for age. No lymphadenopathy.  Neuro: Oriented to PPT.  Normal mood. Normal affect.     Pelvic: Declined    ASSESSMENT  1) Annual exam 2) Complicated grief  PLAN 1) Physical exam as noted. Discussed healthy lifestyle choices and preventive care. STI testing today. Routine labs done with PCP. Pap due 2029. 2) Referral placed to Behavior Health. Information on [employer] sent via MyChart.  Return in July for Nexplanon  replacement Return in one year for annual exam or as needed for concerns.   Eleanor Canny, CNM      [1]  Outpatient Medications Prior to Visit  Medication Sig   cyanocobalamin  (VITAMIN B12) 1000 MCG/ML injection Inject 1 mL (1,000 mcg total) into the skin every 30 (thirty) days.   etonogestrel  (NEXPLANON ) 68 MG IMPL implant 1 each by Subdermal route once.   linaclotide  (LINZESS ) 145 MCG CAPS capsule Take 1 capsule (145 mcg total) by mouth daily before breakfast.   omeprazole  (PRILOSEC) 40 MG capsule TAKE 1 CAPSULE(40 MG) BY MOUTH TWICE DAILY BEFORE A MEAL   OZEMPIC , 0.25 OR 0.5 MG/DOSE, 2 MG/3ML SOPN Inject 0.25 mg into the skin once a week.   rosuvastatin  (CRESTOR ) 5 MG tablet Take 1 tablet (5 mg total) by mouth daily.   Syringe/Needle, Disp, (SYRINGE 3CC/25GX1) 25G X 1 3 ML MISC Use to injection Vitamin B12 monthly   No facility-administered medications prior to visit.   "

## 2024-06-24 LAB — HEP, RPR, HIV PANEL
HIV Screen 4th Generation wRfx: NONREACTIVE
Hepatitis B Surface Ag: NEGATIVE
RPR Ser Ql: NONREACTIVE

## 2024-06-27 LAB — CERVICOVAGINAL ANCILLARY ONLY
Bacterial Vaginitis (gardnerella): NEGATIVE
Candida Glabrata: NEGATIVE
Candida Vaginitis: NEGATIVE
Chlamydia: NEGATIVE
Comment: NEGATIVE
Comment: NEGATIVE
Comment: NEGATIVE
Comment: NEGATIVE
Comment: NEGATIVE
Comment: NORMAL
Neisseria Gonorrhea: NEGATIVE
Trichomonas: NEGATIVE

## 2024-06-28 ENCOUNTER — Ambulatory Visit: Payer: Self-pay | Admitting: Obstetrics

## 2024-07-31 ENCOUNTER — Ambulatory Visit: Admitting: Psychology
